# Patient Record
Sex: Male | Born: 1940 | Race: White | Hispanic: No | Marital: Married | Smoking: Former smoker
Health system: Southern US, Community
[De-identification: ages and names within clinical notes are randomized; demographics above are authoritative.]

## PROBLEM LIST (undated history)

## (undated) DIAGNOSIS — E119 Type 2 diabetes mellitus without complications: Secondary | ICD-10-CM

## (undated) DIAGNOSIS — J8410 Pulmonary fibrosis, unspecified: Secondary | ICD-10-CM

## (undated) DIAGNOSIS — I10 Essential (primary) hypertension: Secondary | ICD-10-CM

## (undated) DIAGNOSIS — M3320 Polymyositis, organ involvement unspecified: Secondary | ICD-10-CM

## (undated) HISTORY — PX: EYE SURGERY: SHX253

## (undated) HISTORY — PX: HERNIA REPAIR: SHX51

## (undated) HISTORY — PX: TESTICLE REMOVAL: SHX68

---

## 2014-06-05 ENCOUNTER — Ambulatory Visit: Payer: Self-pay | Admitting: Family Medicine

## 2014-11-10 ENCOUNTER — Ambulatory Visit: Payer: Self-pay | Admitting: Family Medicine

## 2015-07-06 ENCOUNTER — Other Ambulatory Visit: Payer: Self-pay | Admitting: Rheumatology

## 2015-07-06 DIAGNOSIS — M3321 Polymyositis with respiratory involvement: Secondary | ICD-10-CM

## 2015-07-13 ENCOUNTER — Ambulatory Visit
Admission: RE | Admit: 2015-07-13 | Discharge: 2015-07-13 | Disposition: A | Discharge status: Home / Self Care | Payer: Medicare Other | Source: Ambulatory Visit | Attending: Rheumatology | Admitting: Rheumatology

## 2015-07-13 DIAGNOSIS — M3321 Polymyositis with respiratory involvement: Secondary | ICD-10-CM

## 2015-07-13 DIAGNOSIS — M3320 Polymyositis, organ involvement unspecified: Secondary | ICD-10-CM | POA: Insufficient documentation

## 2015-07-13 DIAGNOSIS — M6281 Muscle weakness (generalized): Secondary | ICD-10-CM | POA: Insufficient documentation

## 2016-05-18 ENCOUNTER — Encounter: Payer: Self-pay | Admitting: Medical Oncology

## 2016-05-18 ENCOUNTER — Emergency Department: Payer: Medicare Other

## 2016-05-18 ENCOUNTER — Inpatient Hospital Stay
Admission: EM | Admit: 2016-05-18 | Discharge: 2016-05-27 | DRG: 871 | Disposition: A | Discharge status: Home / Self Care | Payer: Medicare Other | Attending: Internal Medicine | Admitting: Internal Medicine

## 2016-05-18 DIAGNOSIS — E119 Type 2 diabetes mellitus without complications: Secondary | ICD-10-CM | POA: Diagnosis present

## 2016-05-18 DIAGNOSIS — D61818 Other pancytopenia: Secondary | ICD-10-CM | POA: Diagnosis present

## 2016-05-18 DIAGNOSIS — M3320 Polymyositis, organ involvement unspecified: Secondary | ICD-10-CM | POA: Diagnosis present

## 2016-05-18 DIAGNOSIS — J8410 Pulmonary fibrosis, unspecified: Secondary | ICD-10-CM | POA: Diagnosis present

## 2016-05-18 DIAGNOSIS — K7460 Unspecified cirrhosis of liver: Secondary | ICD-10-CM | POA: Diagnosis present

## 2016-05-18 DIAGNOSIS — A419 Sepsis, unspecified organism: Principal | ICD-10-CM | POA: Diagnosis present

## 2016-05-18 DIAGNOSIS — R630 Anorexia: Secondary | ICD-10-CM | POA: Diagnosis not present

## 2016-05-18 DIAGNOSIS — D899 Disorder involving the immune mechanism, unspecified: Secondary | ICD-10-CM | POA: Diagnosis present

## 2016-05-18 DIAGNOSIS — R7989 Other specified abnormal findings of blood chemistry: Secondary | ICD-10-CM | POA: Diagnosis present

## 2016-05-18 DIAGNOSIS — K641 Second degree hemorrhoids: Secondary | ICD-10-CM | POA: Diagnosis present

## 2016-05-18 DIAGNOSIS — Z79899 Other long term (current) drug therapy: Secondary | ICD-10-CM | POA: Diagnosis not present

## 2016-05-18 DIAGNOSIS — K2970 Gastritis, unspecified, without bleeding: Secondary | ICD-10-CM | POA: Diagnosis present

## 2016-05-18 DIAGNOSIS — Z9981 Dependence on supplemental oxygen: Secondary | ICD-10-CM | POA: Diagnosis not present

## 2016-05-18 DIAGNOSIS — D649 Anemia, unspecified: Secondary | ICD-10-CM | POA: Diagnosis not present

## 2016-05-18 DIAGNOSIS — D62 Acute posthemorrhagic anemia: Secondary | ICD-10-CM | POA: Diagnosis not present

## 2016-05-18 DIAGNOSIS — R188 Other ascites: Secondary | ICD-10-CM | POA: Diagnosis present

## 2016-05-18 DIAGNOSIS — Z7982 Long term (current) use of aspirin: Secondary | ICD-10-CM | POA: Diagnosis not present

## 2016-05-18 DIAGNOSIS — Z8249 Family history of ischemic heart disease and other diseases of the circulatory system: Secondary | ICD-10-CM | POA: Diagnosis not present

## 2016-05-18 DIAGNOSIS — J189 Pneumonia, unspecified organism: Secondary | ICD-10-CM | POA: Diagnosis present

## 2016-05-18 DIAGNOSIS — K5730 Diverticulosis of large intestine without perforation or abscess without bleeding: Secondary | ICD-10-CM | POA: Diagnosis present

## 2016-05-18 DIAGNOSIS — Z66 Do not resuscitate: Secondary | ICD-10-CM | POA: Diagnosis present

## 2016-05-18 DIAGNOSIS — Z882 Allergy status to sulfonamides status: Secondary | ICD-10-CM

## 2016-05-18 DIAGNOSIS — I10 Essential (primary) hypertension: Secondary | ICD-10-CM | POA: Diagnosis present

## 2016-05-18 DIAGNOSIS — J9611 Chronic respiratory failure with hypoxia: Secondary | ICD-10-CM | POA: Diagnosis present

## 2016-05-18 DIAGNOSIS — Z87891 Personal history of nicotine dependence: Secondary | ICD-10-CM

## 2016-05-18 DIAGNOSIS — E872 Acidosis: Secondary | ICD-10-CM | POA: Diagnosis present

## 2016-05-18 DIAGNOSIS — R748 Abnormal levels of other serum enzymes: Secondary | ICD-10-CM | POA: Diagnosis present

## 2016-05-18 DIAGNOSIS — K769 Liver disease, unspecified: Secondary | ICD-10-CM

## 2016-05-18 DIAGNOSIS — R17 Unspecified jaundice: Secondary | ICD-10-CM | POA: Diagnosis present

## 2016-05-18 DIAGNOSIS — D849 Immunodeficiency, unspecified: Secondary | ICD-10-CM

## 2016-05-18 DIAGNOSIS — R110 Nausea: Secondary | ICD-10-CM | POA: Diagnosis not present

## 2016-05-18 DIAGNOSIS — Z888 Allergy status to other drugs, medicaments and biological substances status: Secondary | ICD-10-CM | POA: Diagnosis not present

## 2016-05-18 DIAGNOSIS — B179 Acute viral hepatitis, unspecified: Secondary | ICD-10-CM | POA: Insufficient documentation

## 2016-05-18 DIAGNOSIS — R160 Hepatomegaly, not elsewhere classified: Secondary | ICD-10-CM

## 2016-05-18 DIAGNOSIS — R000 Tachycardia, unspecified: Secondary | ICD-10-CM | POA: Diagnosis present

## 2016-05-18 DIAGNOSIS — R0682 Tachypnea, not elsewhere classified: Secondary | ICD-10-CM | POA: Diagnosis present

## 2016-05-18 HISTORY — DX: Type 2 diabetes mellitus without complications: E11.9

## 2016-05-18 HISTORY — DX: Polymyositis, organ involvement unspecified: M33.20

## 2016-05-18 HISTORY — DX: Essential (primary) hypertension: I10

## 2016-05-18 HISTORY — DX: Pulmonary fibrosis, unspecified: J84.10

## 2016-05-18 LAB — URINALYSIS COMPLETE WITH MICROSCOPIC (ARMC ONLY)
Bacteria, UA: NONE SEEN
GLUCOSE, UA: NEGATIVE mg/dL
HGB URINE DIPSTICK: NEGATIVE
LEUKOCYTES UA: NEGATIVE
Nitrite: NEGATIVE
Protein, ur: NEGATIVE mg/dL
SPECIFIC GRAVITY, URINE: 1.02 (ref 1.005–1.030)
pH: 5 (ref 5.0–8.0)

## 2016-05-18 LAB — GLUCOSE, CAPILLARY
GLUCOSE-CAPILLARY: 126 mg/dL — AB (ref 65–99)
Glucose-Capillary: 96 mg/dL (ref 65–99)

## 2016-05-18 LAB — LIPID PANEL
CHOLESTEROL: 212 mg/dL — AB (ref 0–200)
TRIGLYCERIDES: 170 mg/dL — AB (ref <150)
VLDL: 34 mg/dL (ref 0–40)

## 2016-05-18 LAB — CBC WITH DIFFERENTIAL/PLATELET
BASOS ABS: 0 10*3/uL (ref 0–0.1)
BASOS PCT: 0 %
Eosinophils Absolute: 0 10*3/uL (ref 0–0.7)
Eosinophils Relative: 0 %
HEMATOCRIT: 27.7 % — AB (ref 40.0–52.0)
Hemoglobin: 9.2 g/dL — ABNORMAL LOW (ref 13.0–18.0)
LYMPHS ABS: 0.5 10*3/uL — AB (ref 1.0–3.6)
LYMPHS PCT: 17 %
MCH: 37.1 pg — ABNORMAL HIGH (ref 26.0–34.0)
MCHC: 33.4 g/dL (ref 32.0–36.0)
MCV: 111.1 fL — AB (ref 80.0–100.0)
MONOS PCT: 6 %
Monocytes Absolute: 0.2 10*3/uL (ref 0.2–1.0)
NEUTROS ABS: 2.2 10*3/uL (ref 1.4–6.5)
Neutrophils Relative %: 77 %
PLATELETS: 274 10*3/uL (ref 150–440)
RBC: 2.49 MIL/uL — ABNORMAL LOW (ref 4.40–5.90)
RDW: 21.2 % — AB (ref 11.5–14.5)
WBC: 2.9 10*3/uL — ABNORMAL LOW (ref 3.8–10.6)

## 2016-05-18 LAB — TROPONIN I: Troponin I: 0.03 ng/mL (ref <0.031)

## 2016-05-18 LAB — LACTIC ACID, PLASMA
LACTIC ACID, VENOUS: 10.5 mmol/L — AB (ref 0.5–2.0)
LACTIC ACID, VENOUS: 9.5 mmol/L — AB (ref 0.5–2.0)
LACTIC ACID, VENOUS: 10 mmol/L — AB (ref 0.5–2.0)

## 2016-05-18 LAB — COMPREHENSIVE METABOLIC PANEL
ALT: 131 U/L — ABNORMAL HIGH (ref 17–63)
ANION GAP: 22 — AB (ref 5–15)
AST: 144 U/L — ABNORMAL HIGH (ref 15–41)
Albumin: 2.8 g/dL — ABNORMAL LOW (ref 3.5–5.0)
Alkaline Phosphatase: 140 U/L — ABNORMAL HIGH (ref 38–126)
BILIRUBIN TOTAL: 9.8 mg/dL — AB (ref 0.3–1.2)
BUN: 31 mg/dL — AB (ref 6–20)
CHLORIDE: 95 mmol/L — AB (ref 101–111)
CO2: 20 mmol/L — ABNORMAL LOW (ref 22–32)
Calcium: 9.5 mg/dL (ref 8.9–10.3)
Creatinine, Ser: 0.99 mg/dL (ref 0.61–1.24)
Glucose, Bld: 108 mg/dL — ABNORMAL HIGH (ref 65–99)
POTASSIUM: 4.4 mmol/L (ref 3.5–5.1)
Sodium: 137 mmol/L (ref 135–145)
TOTAL PROTEIN: 6.3 g/dL — AB (ref 6.5–8.1)

## 2016-05-18 LAB — LIPASE, BLOOD: LIPASE: 164 U/L — AB (ref 11–51)

## 2016-05-18 LAB — FERRITIN: Ferritin: 890 ng/mL — ABNORMAL HIGH (ref 24–336)

## 2016-05-18 LAB — PROTIME-INR
INR: 1.29
Prothrombin Time: 16.2 seconds — ABNORMAL HIGH (ref 11.4–15.0)

## 2016-05-18 LAB — APTT: APTT: 39 s — AB (ref 24–36)

## 2016-05-18 MED ORDER — SODIUM CHLORIDE 0.9 % IV BOLUS (SEPSIS)
1000.0000 mL | INTRAVENOUS | Status: AC
Start: 2016-05-18 — End: 2016-05-18
  Administered 2016-05-18: 1000 mL via INTRAVENOUS

## 2016-05-18 MED ORDER — SODIUM CHLORIDE 0.9 % IV SOLN
INTRAVENOUS | Status: DC
  Administered 2016-05-18 – 2016-05-26 (×13): via INTRAVENOUS

## 2016-05-18 MED ORDER — PREDNISONE 10 MG PO TABS
10.0000 mg | ORAL_TABLET | Freq: Every day | ORAL | Status: DC
Start: 2016-05-19 — End: 2016-05-20
  Administered 2016-05-19 – 2016-05-20 (×2): 10 mg via ORAL
  Filled 2016-05-18 (×2): qty 1

## 2016-05-18 MED ORDER — DEXTROSE 5 % IV SOLN
1.0000 g | Freq: Once | INTRAVENOUS | Status: AC
  Administered 2016-05-18: 1 g via INTRAVENOUS
  Filled 2016-05-18: qty 1

## 2016-05-18 MED ORDER — SODIUM CHLORIDE 0.9 % IV BOLUS (SEPSIS)
2000.0000 mL | INTRAVENOUS | Status: AC
  Administered 2016-05-18: 2000 mL via INTRAVENOUS

## 2016-05-18 MED ORDER — MORPHINE SULFATE (PF) 2 MG/ML IV SOLN: 2.0000 mg | Freq: Four times a day (QID) | INTRAVENOUS | Status: DC | PRN

## 2016-05-18 MED ORDER — DEXTROSE 5 % IV SOLN: 1.0000 g | Freq: Two times a day (BID) | INTRAVENOUS | Status: DC

## 2016-05-18 MED ORDER — VANCOMYCIN HCL IN DEXTROSE 1-5 GM/200ML-% IV SOLN
1000.0000 mg | Freq: Two times a day (BID) | INTRAVENOUS | Status: DC
  Administered 2016-05-18 – 2016-05-20 (×4): 1000 mg via INTRAVENOUS
  Filled 2016-05-18 (×5): qty 200

## 2016-05-18 MED ORDER — DEXTROSE 5 % IV SOLN
1.0000 g | Freq: Two times a day (BID) | INTRAVENOUS | Status: DC
  Administered 2016-05-18 – 2016-05-22 (×8): 1 g via INTRAVENOUS
  Filled 2016-05-18 (×9): qty 1

## 2016-05-18 MED ORDER — VANCOMYCIN HCL IN DEXTROSE 1-5 GM/200ML-% IV SOLN
1000.0000 mg | Freq: Once | INTRAVENOUS | Status: AC
  Administered 2016-05-18: 1000 mg via INTRAVENOUS
  Filled 2016-05-18: qty 200

## 2016-05-18 MED ORDER — INSULIN ASPART 100 UNIT/ML ~~LOC~~ SOLN
0.0000 [IU] | Freq: Three times a day (TID) | SUBCUTANEOUS | Status: DC
  Administered 2016-05-19 – 2016-05-20 (×2): 3 [IU] via SUBCUTANEOUS
  Administered 2016-05-20: 5 [IU] via SUBCUTANEOUS
  Administered 2016-05-20 – 2016-05-21 (×2): 2 [IU] via SUBCUTANEOUS
  Administered 2016-05-21: 3 [IU] via SUBCUTANEOUS
  Filled 2016-05-18: qty 3
  Filled 2016-05-18: qty 5
  Filled 2016-05-18 (×2): qty 2
  Filled 2016-05-18 (×2): qty 3

## 2016-05-18 NOTE — Progress Notes (Signed)
ANTIBIOTIC CONSULT NOTE - INITIAL  Pharmacy Consult for Vancomycin/renal adjust medications Indication: pneumonia  Allergies  Allergen Reactions  . Atorvastatin     Other reaction(s): Muscle Pain  . Sulfa Antibiotics Diarrhea  . Sulfamethoxazole-Trimethoprim Diarrhea    Patient Measurements: Height: 5\' 10"  (177.8 cm) Weight: 185 lb (83.915 kg) IBW/kg (Calculated) : 73   Vital Signs: Temp: 97.6 F (36.4 C) (05/25 1056) Temp Source: Oral (05/25 1056) BP: 141/67 mmHg (05/25 1430) Pulse Rate: 88 (05/25 1430) Intake/Output from previous day:   Intake/Output from this shift: Total I/O In: 1000 [I.V.:1000] Out: 100 [Urine:100]  Labs:  Recent Labs  05/18/16 1150  WBC 2.9*  HGB 9.2*  PLT 274  CREATININE 0.99   Estimated Creatinine Clearance: 67.6 mL/min (by C-G formula based on Cr of 0.99). No results for input(s): VANCOTROUGH, VANCOPEAK, VANCORANDOM, GENTTROUGH, GENTPEAK, GENTRANDOM, TOBRATROUGH, TOBRAPEAK, TOBRARND, AMIKACINPEAK, AMIKACINTROU, AMIKACIN in the last 72 hours.   Microbiology: No results found for this or any previous visit (from the past 720 hour(s)).  Medical History: Past Medical History  Diagnosis Date  . Polymyositis (HCC)   . Pulmonary fibrosis (HCC)   . Diabetes mellitus without complication (HCC)   . Hypertension     Medications:  Scheduled:  . insulin aspart  0-9 Units Subcutaneous TID WC   Infusions:  . sodium chloride    . ceFEPime (MAXIPIME) IV    . vancomycin 1,000 mg (05/18/16 1406)  . vancomycin     Assessment: Pharmacy consulted to dose Vancomycin in a 75 yo male for HCAP. Patient received Vancomycin 1 gm IV once in ED at 1406.  Est CrCl~68 mL/min, ke: 0.061, t1/2: 11.36 h, Vd: 58.7 L  Goal of Therapy:  Vancomycin trough level 15-20 mcg/ml  Plan:  Will order Vancomycin 1 gm IV q12h with next dose due in 6 hours for stacked dosing. Estimated trough calculated of ~16 based on kinetic parameters.  Will check trough prior  to 5th dose (should be at steady state) on 5/27 at 1930.  Will order Scr in AM for monitoring.   Pharmacy will continue to follow.   Clarisa Schoolsrystal Leanora Murin, PharmD Clinical Pharmacist 05/18/2016

## 2016-05-18 NOTE — ED Notes (Signed)
Pt reports that he was sent by his PCP for sob and jaundice. Pt reports he was placed on a med that causes him to have jaundice but now pt reports that he has been having pale colored stools and worsening of jaundice. Pt denies pain.

## 2016-05-18 NOTE — ED Notes (Signed)
Called code sepsis to doug in [facility]  [phone]

## 2016-05-18 NOTE — ED Notes (Signed)
Pt in via triage; pt wife reports pt becoming jaundiced since last night.  Pt wife reports light color stools since Monday.  Pt with hx of pulmonary fibrosis and polymyositis.  Pt reports having liver function checked regularly due to one of his medications.  Pt A/Ox4, tachypneic, 2L home O2 upon arrival.

## 2016-05-18 NOTE — Progress Notes (Signed)
Pt arrived on unit. VSS, telemetry monitor was placed.   Lee Johnson

## 2016-05-18 NOTE — Consult Note (Signed)
[facility]  [address], [address] [place], [place] [address] Phone: [phone] Fax : [phone]  Consultation  Referring Provider:     No ref. provider found Primary Care Physician:  Dione Housekeeper, MD Primary Gastroenterologist:           Reason for Consultation:     Abnormal liver enzymes  Date of Admission:  05/18/2016 Date of Consultation:  05/18/2016         HPI:   Lee Johnson is a 75 y.o. male was a history of polymyositis porta fibrosis diabetes and hypertension. The patient has been on azathioprine for his polymyositis and the dose was increased last month. The patient states that his been feeling progressively weak and had started getting dark urine for the last couple of weeks. The wife states that she noticed him to become yellow yesterday. She noticed at the skin around his eyes were yellow. The patient was also found to have a high lactic acid level in the ER. The patient denies any other medications that are new and he also denies any over-the-counter medications. There is no report of any nausea vomiting fevers or chills. He also denies alcohol abuse.  Past Medical History  Diagnosis Date  . Polymyositis (HCC)   . Pulmonary fibrosis (HCC)   . Diabetes mellitus without complication (HCC)   . Hypertension     No past surgical history on file.  Prior to Admission medications   Medication Sig Start Date End Date Taking? Authorizing Provider  Acetaminophen 500 MG coapsule Take 1,000 mg by mouth daily. Pt also takes PRN   Yes Historical Provider, MD  aspirin EC 81 MG tablet Take 81 mg by mouth daily.    Yes Historical Provider, MD  azaTHIOprine (IMURAN) 50 MG tablet Take 2 tablets by mouth 2 (two) times daily. 03/27/16  Yes Historical Provider, MD  betamethasone valerate lotion (VALISONE) 0.1 % Apply 1 application topically as needed. apply to face 10/07/13  Yes Historical Provider, MD  Calcium Carbonate-Vitamin D 600-400 MG-UNIT tablet Take 1  tablet by mouth daily.    Yes Historical Provider, MD  Cholecalciferol (VITAMIN D-1000 MAX ST) 1000 units tablet Take 1,000 Units by mouth daily.    Yes Historical Provider, MD  enalapril-hydrochlorothiazide (VASERETIC) 10-25 MG tablet Take 0.5 tablets by mouth daily.  12/06/15 12/05/16 Yes Historical Provider, MD  esomeprazole (NEXIUM) 20 MG capsule Take 20 mg by mouth daily.    Yes Historical Provider, MD  Flaxseed, Linseed, (FLAXSEED OIL) 1000 MG CAPS Take 1 capsule by mouth 2 (two) times daily.    Yes Historical Provider, MD  glipiZIDE (GLUCOTROL XL) 10 MG 24 hr tablet Take 10 mg by mouth daily after supper.  05/18/16  Yes Historical Provider, MD  glipiZIDE (GLUCOTROL) 10 MG tablet Take 10 mg by mouth daily.  05/05/16 05/05/17 Yes Historical Provider, MD  glucose (SUNMARK GLUCOSE) 4 GM chewable tablet Chew 1 tablet by mouth as needed. For low blood sugar 03/21/16 03/21/17 Yes Historical Provider, MD  metFORMIN (GLUCOPHAGE-XR) 500 MG 24 hr tablet Take 2 tablets by mouth 2 (two) times daily after a meal. 12/06/15 12/05/16 Yes Historical Provider, MD  Omega-3 Fatty Acids (FISH OIL) 1000 MG CAPS Take 1 capsule by mouth 2 (two) times daily.   Yes Historical Provider, MD  predniSONE (DELTASONE) 5 MG tablet Take 2 tablets by mouth daily.   Yes Historical Provider, MD  tamsulosin (FLOMAX) 0.4 MG CAPS capsule Take 1 capsule by mouth daily after supper. 07/27/15  07/26/16 Yes Historical Provider, MD    Family History  Problem Relation Age of Onset  . CAD Mother      Social History  Substance Use Topics  . Smoking status: Never Smoker   . Smokeless tobacco: None  . Alcohol Use: No    Allergies as of 05/18/2016 - Review Complete 05/18/2016  Allergen Reaction Noted  . Atorvastatin  05/18/2016  . Sulfa antibiotics Diarrhea 05/18/2016  . Sulfamethoxazole-trimethoprim Diarrhea 05/18/2016    Review of Systems:    All systems reviewed and negative except where noted in HPI.   Physical Exam:  Vital signs  in last 24 hours: Temp:  [97.2 F (36.2 C)-97.6 F (36.4 C)] 97.2 F (36.2 C) (05/25 1639) Pulse Rate:  [86-120] 90 (05/25 1639) Resp:  [16-26] 18 (05/25 1639) BP: (116-145)/(50-70) 116/50 mmHg (05/25 1639) SpO2:  [98 %-100 %] 100 % (05/25 1639) Weight:  [179 lb 14.4 oz (81.602 kg)-185 lb (83.915 kg)] 179 lb 14.4 oz (81.602 kg) (05/25 1639)   General:   Pleasant, cooperative in NAD Head:  Normocephalic and atraumatic. Eyes:   Positive scleral  icterus.   Conjunctiva yellow. PERRLA. Ears:  Normal auditory acuity. Neck:  Supple; no masses or thyroidomegaly Lungs: Respirations even and unlabored. Lungs clear to auscultation bilaterally.   No wheezes, crackles, or rhonchi.  Heart:  Regular rate and rhythm;  Without murmur, clicks, rubs or gallops Abdomen:  Soft, nondistended, nontender. Normal bowel sounds. No appreciable masses or hepatomegaly.  No rebound or guarding.  Rectal:  Not performed. Msk:  Symmetrical without gross deformities.    Extremities:  Without edema, cyanosis or clubbing. Neurologic:  Alert and oriented x3;  grossly normal neurologically. Skin:  Intact without significant lesions or rashes. Diffuse jaundice Cervical Nodes:  No significant cervical adenopathy. Psych:  Alert and cooperative. Normal affect.  LAB RESULTS:  Recent Labs  05/18/16 1150  WBC 2.9*  HGB 9.2*  HCT 27.7*  PLT 274   BMET  Recent Labs  05/18/16 1150  NA 137  K 4.4  CL 95*  CO2 20*  GLUCOSE 108*  BUN 31*  CREATININE 0.99  CALCIUM 9.5   LFT  Recent Labs  05/18/16 1150  PROT 6.3*  ALBUMIN 2.8*  AST 144*  ALT 131*  ALKPHOS 140*  BILITOT 9.8*   PT/INR  Recent Labs  05/18/16 1150  LABPROT 16.2*  INR 1.29    STUDIES: Dg Chest 2 View  05/18/2016  CLINICAL DATA:  Worsening shortness of Breath EXAM: CHEST  2 VIEW COMPARISON:  By report from 07/12/2015 FINDINGS: Cardiac shadow is enlarged. Bilateral lower lobe interstitial prominence is noted stable by report from the  prior exam. Increased densities node in the left mid lung which may represent some acute on chronic infiltrate. No bony abnormality is seen. IMPRESSION: Bibasilar chronic changes by report. Increased density in the left mid lung which may represent acute on chronic infiltrate. Electronically Signed   By: Alcide Clever M.D.   On: 05/18/2016 12:21      Impression / Plan:   Lee Johnson is a 75 y.o. y/o male with With a history of polymyositis, already fibrosis diabetes and hypertension. The patient was now found to have an elevated Michela Pitcher Roux been with increased AST and ALT. The patient's levels are more consistent with injury pattern cholestasis then extrahepatic obstruction. His may be due to infection versus medications. The patient will have his labs sent for other possible causes of abnormal liver enzymes and his liver  enzymes will be followed. The patient also has an ultrasound pending but I suspect the bile duct will be normal in size. Patient and his wife have been explained the plan and agree with it.   Thank you for involving me in the care of this patient.      LOS: 0 days   Midge Miniumarren Agness Sibrian, MD  05/18/2016, 5:59 PM   Note: This dictation was prepared with Dragon dictation along with smaller phrase technology. Any transcriptional errors that result from this process are unintentional.

## 2016-05-18 NOTE — ED Provider Notes (Signed)
[facility] Emergency Department Provider Note  ____________________________________________  Time seen: Approximately 11:40 AM  I have reviewed the triage vital signs and the nursing notes.   HISTORY  Chief Complaint Jaundice    HPI Lee Johnson is a 75 y.o. male with a comp located past medical history that includes polymyositis and subsequent development of pulmonary fibrosis on Imuran and who, until last month, was on nintedanib for idiopathic pulmonary fibrosis but was taken off since his pulmonary fibrosis is thought to be caused by the polymyositis.  He is followed by Dr. Lavenia Atlas as well as a pulmonologist at [facility]  He presents today by private vehicle for worsening shortness of breath, gradual onset over at least one week, in addition to acute onset of jaundice starting yesterday.  He is on 2 L of oxygen at all times and states that his shortness of breath has gotten so bad that he cannot get up out of bed without assistance and he is immediately out of breath.  His wife started having to help him to the bathroom starting about 4 days ago when she noticed that his stool is very light in color.  Last night she noticed that his skin and his eyes were turning yellow and it is much worse today, described as severe.  He has been taking his regular medications but nothing is making his symptoms better and they are gradually getting worse over time.  Any kind of exertion makes his shortness of breath and fatigue worse.  He denies fever/chills, chest pain, abdominal pain, nausea, vomiting, diarrhea, dysuria.  He states that he has had no energy and no appetite for the last week so has not been eating or drinking well.   Past Medical History  Diagnosis Date  . Polymyositis (HCC)   . Pulmonary fibrosis (HCC)   . Diabetes mellitus without complication (HCC)   . Hypertension     There are no active problems to display for this patient.   No past  surgical history on file.  Current Outpatient Rx  Name  Route  Sig  Dispense  Refill  . Acetaminophen 500 MG coapsule   Oral   Take 1,000 mg by mouth daily. Pt also takes PRN         . aspirin EC 81 MG tablet   Oral   Take 81 mg by mouth daily.          Marland Kitchen azaTHIOprine (IMURAN) 50 MG tablet   Oral   Take 2 tablets by mouth 2 (two) times daily.         . betamethasone valerate lotion (VALISONE) 0.1 %   Topical   Apply 1 application topically as needed. apply to face         . Calcium Carbonate-Vitamin D 600-400 MG-UNIT tablet   Oral   Take 1 tablet by mouth daily.          . Cholecalciferol (VITAMIN D-1000 MAX ST) 1000 units tablet   Oral   Take 1,000 Units by mouth daily.          . enalapril-hydrochlorothiazide (VASERETIC) 10-25 MG tablet   Oral   Take 0.5 tablets by mouth daily.          Marland Kitchen esomeprazole (NEXIUM) 20 MG capsule   Oral   Take 20 mg by mouth daily.          . Flaxseed, Linseed, (FLAXSEED OIL) 1000 MG CAPS   Oral   Take 1  capsule by mouth 2 (two) times daily.          Marland Kitchen glipiZIDE (GLUCOTROL XL) 10 MG 24 hr tablet   Oral   Take 10 mg by mouth daily after supper.          Marland Kitchen glipiZIDE (GLUCOTROL) 10 MG tablet   Oral   Take 10 mg by mouth daily.          Marland Kitchen glucose (SUNMARK GLUCOSE) 4 GM chewable tablet   Oral   Chew 1 tablet by mouth as needed. For low blood sugar         . metFORMIN (GLUCOPHAGE-XR) 500 MG 24 hr tablet   Oral   Take 2 tablets by mouth 2 (two) times daily after a meal.         . Omega-3 Fatty Acids (FISH OIL) 1000 MG CAPS   Oral   Take 1 capsule by mouth 2 (two) times daily.         . predniSONE (DELTASONE) 5 MG tablet   Oral   Take 2 tablets by mouth daily.         . tamsulosin (FLOMAX) 0.4 MG CAPS capsule   Oral   Take 1 capsule by mouth daily after supper.           Allergies Atorvastatin; Sulfa antibiotics; and Sulfamethoxazole-trimethoprim  No family history on file.  Social  History Social History  Substance Use Topics  . Smoking status: Never Smoker   . Smokeless tobacco: None  . Alcohol Use: No    Review of Systems Constitutional: No fever/chills.  General fatigue and malaise.   Eyes: No visual changes. ENT: No sore throat. Cardiovascular: Denies chest pain. Respiratory: Worsening shortness of breath (acute on chronic) Gastrointestinal: No abdominal pain.  +Anorexia.  No nausea, no vomiting.  No diarrhea.  No constipation.  Light colored stools. Genitourinary: Negative for dysuria. Musculoskeletal: Negative for back pain. Skin: Wife noticed jaundice starting last night Neurological: Negative for headaches, focal weakness or numbness.  10-point ROS otherwise negative.  ____________________________________________   PHYSICAL EXAM:  VITAL SIGNS: ED Triage Vitals  Enc Vitals Group     BP 05/18/16 1056 136/60 mmHg     Pulse Rate 05/18/16 1056 120     Resp 05/18/16 1056 18     Temp 05/18/16 1056 97.6 F (36.4 C)     Temp Source 05/18/16 1056 Oral     SpO2 05/18/16 1056 98 %     Weight 05/18/16 1056 185 lb (83.915 kg)     Height 05/18/16 1056 5\' 10"  (1.778 m)     Head Cir --      Peak Flow --      Pain Score --      Pain Loc --      Pain Edu? --      Excl. in GC? --     Constitutional: Alert and oriented. Appears ill but non-toxic.   Eyes: +Scleral icterus. PERRL. EOMI. Head: Atraumatic. Nose: No congestion/rhinnorhea. Mouth/Throat: Mucous membranes are dry.  Oropharynx non-erythematous. Neck: No stridor.  No meningeal signs.   Cardiovascular: Tachycardia, regular rhythm. Good peripheral circulation. Grossly normal heart sounds.   Respiratory: Normal respiratory effort.  No retractions. Lungs CTAB. Gastrointestinal: Soft and nontender throughout. No distention.  Musculoskeletal: No lower extremity tenderness nor edema. No gross deformities of extremities. Neurologic:  Normal speech and language. No gross focal neurologic deficits are  appreciated.  Skin:  Skin is warm, dry and intact. No rash noted. Psychiatric:  Mood and affect are depressed but the patient is AOx3  ____________________________________________   LABS (all labs ordered are listed, but only abnormal results are displayed)  Labs Reviewed  CBC WITH DIFFERENTIAL/PLATELET - Abnormal; Notable for the following:    WBC 2.9 (*)    RBC 2.49 (*)    Hemoglobin 9.2 (*)    HCT 27.7 (*)    MCV 111.1 (*)    MCH 37.1 (*)    RDW 21.2 (*)    Lymphs Abs 0.5 (*)    All other components within normal limits  COMPREHENSIVE METABOLIC PANEL - Abnormal; Notable for the following:    Chloride 95 (*)    CO2 20 (*)    Glucose, Bld 108 (*)    BUN 31 (*)    Total Protein 6.3 (*)    Albumin 2.8 (*)    AST 144 (*)    ALT 131 (*)    Alkaline Phosphatase 140 (*)    Total Bilirubin 9.8 (*)    Anion gap 22 (*)    All other components within normal limits  LIPASE, BLOOD - Abnormal; Notable for the following:    Lipase 164 (*)    All other components within normal limits  LIPID PANEL - Abnormal; Notable for the following:    Cholesterol 212 (*)    Triglycerides 170 (*)    HDL <10 (*)    All other components within normal limits  PROTIME-INR - Abnormal; Notable for the following:    Prothrombin Time 16.2 (*)    All other components within normal limits  APTT - Abnormal; Notable for the following:    aPTT 39 (*)    All other components within normal limits  LACTIC ACID, PLASMA - Abnormal; Notable for the following:    Lactic Acid, Venous 10.5 (*)    All other components within normal limits  URINALYSIS COMPLETEWITH MICROSCOPIC ([facility] ONLY) - Abnormal; Notable for the following:    Color, Urine AMBER (*)    APPearance CLEAR (*)    Bilirubin Urine 1+ (*)    Ketones, ur 1+ (*)    Squamous Epithelial / LPF 0-5 (*)    All other components within normal limits  CULTURE, BLOOD (ROUTINE X 2)  CULTURE, BLOOD (ROUTINE X 2)  URINE CULTURE  TROPONIN I  HEPATITIS PANEL,  ACUTE  LACTIC ACID, PLASMA   ____________________________________________  EKG  ED ECG REPORT I, Rickardo Brinegar, the attending physician, personally viewed and interpreted this ECG.  Date: 05/18/2016 EKG Time: 11:41 Rate: 107 Rhythm: Mild sinus tachycardia QRS Axis: normal Intervals: normal ST/T Wave abnormalities: Non-specific ST segment / T-wave changes, but no evidence of acute ischemia. Conduction Disturbances: none Narrative Interpretation: unremarkable  ____________________________________________  RADIOLOGY   Dg Chest 2 View  05/18/2016  CLINICAL DATA:  Worsening shortness of Breath EXAM: CHEST  2 VIEW COMPARISON:  By report from 07/12/2015 FINDINGS: Cardiac shadow is enlarged. Bilateral lower lobe interstitial prominence is noted stable by report from the prior exam. Increased densities node in the left mid lung which may represent some acute on chronic infiltrate. No bony abnormality is seen. IMPRESSION: Bibasilar chronic changes by report. Increased density in the left mid lung which may represent acute on chronic infiltrate. Electronically Signed   By: Alcide CleverMark  Lukens M.D.   On: 05/18/2016 12:21    ____________________________________________   PROCEDURES  Procedure(s) performed: None  Critical Care performed: Yes, see critical care note(s)   CRITICAL CARE Performed by: Loleta RoseFORBACH, Vencent Hauschild   Total  critical care time: 60 minutes  Critical care time was exclusive of separately billable procedures and treating other patients.  Critical care was necessary to treat or prevent imminent or life-threatening deterioration.  Critical care was time spent personally by me on the following activities: development of treatment plan with patient and/or surrogate as well as nursing, discussions with consultants, evaluation of patient's response to treatment, examination of patient, obtaining history from patient or surrogate, ordering and performing treatments and interventions,  ordering and review of laboratory studies, ordering and review of radiographic studies, pulse oximetry and re-evaluation of patient's condition.  ____________________________________________   INITIAL IMPRESSION / ASSESSMENT AND PLAN / ED COURSE  Pertinent labs & imaging results that were available during my care of the patient were reviewed by me and considered in my medical decision making (see chart for details).  Obvious jaundice and scleral icterus, Likely medication induced hepatitis.  He has no abdominal pain or tenderness and has not been having any nausea nor vomiting.  He does have anorexia.  He has no symptoms that would suggest he has a new infectious process such as pneumonia, and his worsening shortness of breath is likely due to the chronic pulmonary fibrosis, but I will also check a two-view chest x-ray to look for signs of infiltrate.  I also ordered a lipid panel and acute hepatitis panel knowing that these pieces sedated would likely be needed by the hospitalist.  I anticipate admission after getting back his initial results.   (Note that documentation was delayed due to multiple ED patients requiring immediate care.)  Patient has possible acute infiltrate in left lung on CXR . Given his immunosuppression, I will check cultures and start empiric antibiotics for HCAP . He has mild leukopenia, but his wife states this is always the case.  His blood pressure is stable and he has no AMS.  He is receiving fluids, empiric antibiotics after cultures, and I will check a lactic acid, but holding off on calling code sepsis.   ----------------------------------------- 1:39 PM on 05/18/2016 -----------------------------------------  I was just informed that the patient's lactate has resulted at 10.5.  His This is multifactorial but given that I am treating empirically for a possible pulmonary infiltrate I will proceed with making him sepsis protocol.  We have called [facility] and I have  ordered the rest of the results including an additional 2 L which will reach his goal of 30 mL/kg IV crystalloid.  I have updated the patient.  He is already receiving empiric antibiotics for HCAP (cefepime and vancomycin) as per the [facility] order set for HCAP .   ----------------------------------------- (Note that documentation was delayed due to multiple ED patients requiring immediate care.) -----------------------------------------  Discussed case with hospitalist who will admit.  Patient remains stable, likely requiring stepdown bed but does not appear to need CCU/intensivist at this time.  Reexamined patient, still no abdominal tenderness to palpation and no abd pain at rest.  HR down to the low 90s.   ____________________________________________  FINAL CLINICAL IMPRESSION(S) / ED DIAGNOSES  Final diagnoses:  Sepsis, due to unspecified organism [facility])  Acute hepatitis  Hyperbilirubinemia  HCAP (healthcare-associated pneumonia)  Pulmonary fibrosis (HCC)  Immunocompromised state (HCC)  Elevated lactic acid level     MEDICATIONS GIVEN DURING THIS VISIT:  Medications  vancomycin (VANCOCIN) IVPB 1000 mg/200 mL premix (1,000 mg Intravenous New Bag/Given 05/18/16 1406)  sodium chloride 0.9 % bolus 1,000 mL (0 mLs Intravenous Stopped 05/18/16 1316)  ceFEPIme (MAXIPIME) 1 g in  dextrose 5 % 50 mL IVPB (1 g Intravenous New Bag/Given 05/18/16 1347)  sodium chloride 0.9 % bolus 2,000 mL (2,000 mLs Intravenous New Bag/Given 05/18/16 1345)     NEW OUTPATIENT MEDICATIONS STARTED DURING THIS VISIT:  New Prescriptions   No medications on file      Note:  This document was prepared using Dragon voice recognition software and may include unintentional dictation errors.   Loleta Rose, MD 05/18/16 [phone]

## 2016-05-18 NOTE — H&P (Signed)
[facility] - [place] [facility]   PATIENT NAME: Lee Johnson    MR#:  [id number]  DATE OF BIRTH:  04-28-1941  DATE OF ADMISSION:  05/18/2016  PRIMARY CARE PHYSICIAN: Dione Housekeeper, MD   REQUESTING/REFERRING PHYSICIAN: York Cerise  CHIEF COMPLAINT:   Chief Complaint  Patient presents with  . Jaundice    HISTORY OF PRESENT ILLNESS: Takai Chiaramonte  is a 75 y.o. male with a known history of Polymyositis, pulmonary fibrosis, diabetes without complication, hypertension- he is on azathioprin for his polymyositis by rheumatologist, the dose was increased last month. For last few days patient is feeling more weak, progressively getting worse. Last 2-3 days wife noted his stool is getting lighter in color, and today she noted he has yellowish skin and eyes so she brought him to emergency room. Patient denies any fever or chills, he has chronic lung damage because of polymyositis and he is on chronic oxygen use at home.  He was noted to have severe jaundice and very high lactic acid in ER, but vitals are stable chest x-ray showed a new infiltrate and so he started on antibiotics. Given to hospitalist team for further management.   PAST MEDICAL HISTORY:   Past Medical History  Diagnosis Date  . Polymyositis (HCC)   . Pulmonary fibrosis (HCC)   . Diabetes mellitus without complication (HCC)   . Hypertension     PAST SURGICAL HISTORY: No past surgical history on file.  SOCIAL HISTORY:  Social History  Substance Use Topics  . Smoking status: Never Smoker   . Smokeless tobacco: Not on file  . Alcohol Use: No    FAMILY HISTORY: No family history on file.  DRUG ALLERGIES:  Allergies  Allergen Reactions  . Atorvastatin     Other reaction(s): Muscle Pain  . Sulfa Antibiotics Diarrhea  . Sulfamethoxazole-Trimethoprim Diarrhea    REVIEW OF SYSTEMS:   CONSTITUTIONAL: No fever,Positive for  fatigue or weakness.  EYES: No blurred or double vision.  EARS, NOSE,  AND THROAT: No tinnitus or ear pain.  RESPIRATORY: No cough,  positive for shortness of breath, wheezing or hemoptysis.  CARDIOVASCULAR: No chest pain, orthopnea, edema.  GASTROINTESTINAL: No nausea, vomiting, diarrhea or abdominal pain.  GENITOURINARY: No dysuria, hematuria.  ENDOCRINE: No polyuria, nocturia,  HEMATOLOGY: No anemia, easy bruising or bleeding SKIN: No rash or lesion. MUSCULOSKELETAL: No joint pain or arthritis.   NEUROLOGIC: No tingling, numbness, weakness.  PSYCHIATRY: No anxiety or depression.   MEDICATIONS AT HOME:  Prior to Admission medications   Medication Sig Start Date End Date Taking? Authorizing Provider  Acetaminophen 500 MG coapsule Take 1,000 mg by mouth daily. Pt also takes PRN   Yes Historical Provider, MD  aspirin EC 81 MG tablet Take 81 mg by mouth daily.    Yes Historical Provider, MD  azaTHIOprine (IMURAN) 50 MG tablet Take 2 tablets by mouth 2 (two) times daily. 03/27/16  Yes Historical Provider, MD  betamethasone valerate lotion (VALISONE) 0.1 % Apply 1 application topically as needed. apply to face 10/07/13  Yes Historical Provider, MD  Calcium Carbonate-Vitamin D 600-400 MG-UNIT tablet Take 1 tablet by mouth daily.    Yes Historical Provider, MD  Cholecalciferol (VITAMIN D-1000 MAX ST) 1000 units tablet Take 1,000 Units by mouth daily.    Yes Historical Provider, MD  enalapril-hydrochlorothiazide (VASERETIC) 10-25 MG tablet Take 0.5 tablets by mouth daily.  12/06/15 12/05/16 Yes Historical Provider, MD  esomeprazole (NEXIUM) 20 MG capsule Take 20 mg by mouth daily.  Yes Historical Provider, MD  Flaxseed, Linseed, (FLAXSEED OIL) 1000 MG CAPS Take 1 capsule by mouth 2 (two) times daily.    Yes Historical Provider, MD  glipiZIDE (GLUCOTROL XL) 10 MG 24 hr tablet Take 10 mg by mouth daily after supper.  05/18/16  Yes Historical Provider, MD  glipiZIDE (GLUCOTROL) 10 MG tablet Take 10 mg by mouth daily.  05/05/16 05/05/17 Yes Historical Provider, MD  glucose  (SUNMARK GLUCOSE) 4 GM chewable tablet Chew 1 tablet by mouth as needed. For low blood sugar 03/21/16 03/21/17 Yes Historical Provider, MD  metFORMIN (GLUCOPHAGE-XR) 500 MG 24 hr tablet Take 2 tablets by mouth 2 (two) times daily after a meal. 12/06/15 12/05/16 Yes Historical Provider, MD  Omega-3 Fatty Acids (FISH OIL) 1000 MG CAPS Take 1 capsule by mouth 2 (two) times daily.   Yes Historical Provider, MD  predniSONE (DELTASONE) 5 MG tablet Take 2 tablets by mouth daily.   Yes Historical Provider, MD  tamsulosin (FLOMAX) 0.4 MG CAPS capsule Take 1 capsule by mouth daily after supper. 07/27/15 07/26/16 Yes Historical Provider, MD      PHYSICAL EXAMINATION:   VITAL SIGNS: Blood pressure 137/70, pulse 90, temperature 97.6 F (36.4 C), temperature source Oral, resp. rate 18, height  (1.778 m), weight 83.915 kg (185 lb), SpO2 100 %.  GENERAL:  75 y.o.-year-old patient lying in the bed with no acute distress.  EYES: Pupils equal, round, reactive to light and accommodation.Present scleral  icterus. Extraocular muscles intact.  HEENT: Head atraumatic, normocephalic. Oropharynx and nasopharynx clear.  NECK:  Supple, no jugular venous distention. No thyroid enlargement, no tenderness.  LUNGS: Normal breath sounds bilaterally, no wheezing,some  crepitation. No use of accessory muscles of respiration. On supplemental oxygen via nasal cannula.  CARDIOVASCULAR: S1, S2 normal. No murmurs, rubs, or gallops.  ABDOMEN: Soft, nontender, nondistended. Bowel sounds present. No organomegaly or mass.  EXTREMITIES: No pedal edema, cyanosis, or clubbing.  NEUROLOGIC: Cranial nerves II through XII are intact. Muscle strength 5/5 in all extremities. Sensation intact. Gait not checked.  PSYCHIATRIC: The patient is alert and oriented x 3.  SKIN: No obvious rash, lesion, or ulcer. Yellowish discoloration of his skin and sclera.  LABORATORY PANEL:   CBC  Recent Labs Lab 05/18/16 1150  WBC 2.9*  HGB 9.2*  HCT  27.7*  PLT 274  MCV 111.1*  MCH 37.1*  MCHC 33.4  RDW 21.2*  LYMPHSABS 0.5*  MONOABS 0.2  EOSABS 0.0  BASOSABS 0.0   ------------------------------------------------------------------------------------------------------------------  Chemistries   Recent Labs Lab 05/18/16 1150  NA 137  K 4.4  CL 95*  CO2 20*  GLUCOSE 108*  BUN 31*  CREATININE 0.99  CALCIUM 9.5  AST 144*  ALT 131*  ALKPHOS 140*  BILITOT 9.8*   ------------------------------------------------------------------------------------------------------------------ estimated creatinine clearance is 67.6 mL/min (by C-G formula based on Cr of 0.99). ------------------------------------------------------------------------------------------------------------------ No results for input(s): TSH, T4TOTAL, T3FREE, THYROIDAB in the last 72 hours.  Invalid input(s): FREET3   Coagulation profile  Recent Labs Lab 05/18/16 1150  INR 1.29   ------------------------------------------------------------------------------------------------------------------- No results for input(s): DDIMER in the last 72 hours. -------------------------------------------------------------------------------------------------------------------  Cardiac Enzymes  Recent Labs Lab 05/18/16 1150  TROPONINI 0.03   ------------------------------------------------------------------------------------------------------------------ Invalid input(s): POCBNP  ---------------------------------------------------------------------------------------------------------------  Urinalysis    Component Value Date/Time   COLORURINE AMBER* 05/18/2016 1342   APPEARANCEUR CLEAR* 05/18/2016 1342   LABSPEC 1.020 05/18/2016 1342   PHURINE 5.0 05/18/2016 1342   GLUCOSEU NEGATIVE 05/18/2016 1342   HGBUR NEGATIVE 05/18/2016 1342  BILIRUBINUR 1+* 05/18/2016 1342   KETONESUR 1+* 05/18/2016 1342   PROTEINUR NEGATIVE 05/18/2016 1342   NITRITE NEGATIVE  05/18/2016 1342   LEUKOCYTESUR NEGATIVE 05/18/2016 1342     RADIOLOGY: Dg Chest 2 View  05/18/2016  CLINICAL DATA:  Worsening shortness of Breath EXAM: CHEST  2 VIEW COMPARISON:  By report from 07/12/2015 FINDINGS: Cardiac shadow is enlarged. Bilateral lower lobe interstitial prominence is noted stable by report from the prior exam. Increased densities node in the left mid lung which may represent some acute on chronic infiltrate. No bony abnormality is seen. IMPRESSION: Bibasilar chronic changes by report. Increased density in the left mid lung which may represent acute on chronic infiltrate. Electronically Signed   By: Alcide CleverMark  Lukens M.D.   On: 05/18/2016 12:21    EKG: Orders placed or performed during the hospital encounter of 05/18/16  . EKG 12-Lead  . EKG 12-Lead    IMPRESSION AND PLAN:  * Jaundice   Most likely OBSTRUCTIVE- as wife said his stools turning light color.   We'll check hepatitis panel, get a right upper quadrant sonogram.   GI to help with further management.   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  * Lactic acidosis and sepsis due to pneumonia   Patient is immunosuppressed, so he is low white blood cell count is not reliable as a signal of infection.   He has tachycardia, tachypnea and hypoxia with finding of pneumonia on chest x-ray and high lactic acid.  ER physician gave her 1 dose of vancomycin and cefepime, I agree with that and will continue that. Follow lactic acid after IV fluids.   * Diabetes   We will hold oral medication as patient would be in liquid diet and keep on insulin sliding scale coverage.  * Hypertension   Hold medication because of presence with the lactic acidosis and sepsis.  * Lung fibrosis and chronic respiratory failure    Continue oxygen supplementation, and treat for pneumonia.     All the records are reviewed and case discussed with ED provider. Management plans discussed with the patient, family and they are  in agreement.  CODE STATUS: DO NOT RESUSCITATE Code Status History    This patient does not have a recorded code status. Please follow your organizational policy for patients in this situation.     Patient's wife is present in the room and I explained the findings and plan to patient and his wife both of them.  TOTAL TIME TAKING CARE OF THIS PATIENT: 50 critical care minutes.    Altamese DillingVACHHANI, Erminio Nygard M.D on 05/18/2016   Between 7am to 6pm - Pager - [phone]  After 6pm go to [email or web address] - password [email or web address]  [facility]  Office  [phone]  CC: Primary care physician; Dione Housekeeperlmedo, Mario Ernesto, MD   Note: This dictation was prepared with Dragon dictation along with smaller phrase technology. Any transcriptional errors that result from this process are unintentional.

## 2016-05-18 NOTE — Progress Notes (Signed)
Lab notified RN of a critical lab, lactic acid is 9.5. Dr. Elisabeth PigeonVachhani was made aware, no new orders given at this time.   Karsten RoLauren E Hobbs

## 2016-05-18 NOTE — Progress Notes (Signed)
Lab notified RN, pt has a critical lab lactic acid is 10, Dr. Nemiah CommanderKalisetti was notified no new orders given at this time. Relayed to the oncoming nurse.   Lee Johnson

## 2016-05-19 ENCOUNTER — Inpatient Hospital Stay: Payer: Medicare Other

## 2016-05-19 LAB — URINE CULTURE: Culture: NO GROWTH

## 2016-05-19 LAB — CBC
HEMATOCRIT: 21.4 % — AB (ref 40.0–52.0)
HEMOGLOBIN: 7.2 g/dL — AB (ref 13.0–18.0)
MCH: 36.7 pg — AB (ref 26.0–34.0)
MCHC: 33.6 g/dL (ref 32.0–36.0)
MCV: 109.5 fL — ABNORMAL HIGH (ref 80.0–100.0)
Platelets: 196 10*3/uL (ref 150–440)
RBC: 1.96 MIL/uL — ABNORMAL LOW (ref 4.40–5.90)
RDW: 22.1 % — ABNORMAL HIGH (ref 11.5–14.5)
WBC: 2.7 10*3/uL — ABNORMAL LOW (ref 3.8–10.6)

## 2016-05-19 LAB — BASIC METABOLIC PANEL
ANION GAP: 11 (ref 5–15)
BUN: 21 mg/dL — ABNORMAL HIGH (ref 6–20)
CO2: 23 mmol/L (ref 22–32)
Calcium: 8.2 mg/dL — ABNORMAL LOW (ref 8.9–10.3)
Chloride: 102 mmol/L (ref 101–111)
Creatinine, Ser: 0.63 mg/dL (ref 0.61–1.24)
GFR calc non Af Amer: >60 mL/min (ref >60)
Glucose, Bld: 94 mg/dL (ref 65–99)
POTASSIUM: 3.9 mmol/L (ref 3.5–5.1)
SODIUM: 136 mmol/L (ref 135–145)

## 2016-05-19 LAB — HEPATITIS PANEL, ACUTE
HCV Ab: <0.1 {s_co_ratio} (ref 0.0–0.9)
Hep A IgM: NEGATIVE
Hep B C IgM: NEGATIVE
Hepatitis B Surface Ag: NEGATIVE

## 2016-05-19 LAB — HEPATIC FUNCTION PANEL
ALBUMIN: 2.1 g/dL — AB (ref 3.5–5.0)
ALT: 100 U/L — AB (ref 17–63)
AST: 110 U/L — AB (ref 15–41)
Alkaline Phosphatase: 112 U/L (ref 38–126)
BILIRUBIN DIRECT: 4.4 mg/dL — AB (ref 0.1–0.5)
BILIRUBIN TOTAL: 7.3 mg/dL — AB (ref 0.3–1.2)
Indirect Bilirubin: 2.9 mg/dL — ABNORMAL HIGH (ref 0.3–0.9)
Total Protein: 4.7 g/dL — ABNORMAL LOW (ref 6.5–8.1)

## 2016-05-19 LAB — GLUCOSE, CAPILLARY
GLUCOSE-CAPILLARY: 212 mg/dL — AB (ref 65–99)
GLUCOSE-CAPILLARY: 196 mg/dL — AB (ref 65–99)
Glucose-Capillary: 85 mg/dL (ref 65–99)
Glucose-Capillary: 113 mg/dL — ABNORMAL HIGH (ref 65–99)

## 2016-05-19 LAB — MRSA PCR SCREENING: MRSA BY PCR: NEGATIVE

## 2016-05-19 LAB — OCCULT BLOOD X 1 CARD TO LAB, STOOL: FECAL OCCULT BLD: POSITIVE — AB

## 2016-05-19 NOTE — Evaluation (Signed)
Physical Therapy Evaluation Patient Details Name: Lee Johnson MRN: [id number] DOB: January 26, 1941 Today's Date: 05/19/2016   History of Present Illness  75 yo M presented to ED from PCP for SOB and jaundice. PMH includes polymyositis, pulmonary fibrosis, DM, HTN.  Clinical Impression  Pt demonstrated significant weakness and difficulty with mobility due to multiple co-morbidities and complications recently.He requires mod A for bed mobility and min A to maintain balance at EOB. LE strength grossly 3+/5. Activity tolerance is very low, as he fatigues quickly and experiences SOB and increased HR. Pt tends to breath shallow and fast, given cues for pursed lip breathing. For transferring from bed to chair, he demonstrated significant posterior lean requiring mod A and FWW for safety. With minimal activity HR increased to 140s and SpO2 ranged 97 to 99% on 2L. STR was discussed with pt as he is very deconditioned and will require extensive assistance for mobility at this time. He does not want to go to STR. PT educated pt on possibility of him or his wife hurting themselves if they attempted mobility by themselves at home. PT discussed with RN about pt's depression and possible need for further evaluation. Pt will benefit from skilled PT services to increase functional I and mobility for safe discharge.     Follow Up Recommendations SNF;Supervision for mobility/OOB    Equipment Recommendations  Rolling walker with 5" wheels;Other (comment) (pt has rollator but that is too unstable for him for now)    Recommendations for Other Services       Precautions / Restrictions Precautions Precautions: Fall Restrictions Weight Bearing Restrictions: No      Mobility  Bed Mobility Overal bed mobility: Needs Assistance Bed Mobility: Supine to Sit     Supine to sit: Mod assist;HOB elevated     General bed mobility comments: difficulty getting trunk upright, becomes anxious and  SOB  Transfers Overall transfer level: Needs assistance Equipment used: Rolling walker (2 wheeled) Transfers: Sit to/from [employer] Sit to Stand: Mod assist;From elevated surface Stand pivot transfers: Mod assist;From elevated surface       General transfer comment: pt pulls up on FWW which demonstrated decreased safety awarenss, cues for hand placement, anterior weight shifting to correct posterior lean, and keeping FWW close  Ambulation/Gait             General Gait Details: NT due to pt's elevated HR and fatigue with stand pivot transfer  Stairs            Wheelchair Mobility    Modified Rankin (Stroke Patients Only)       Balance Overall balance assessment: Needs assistance;History of Falls Sitting-balance support: Bilateral upper extremity supported;Feet supported Sitting balance-Leahy Scale: Poor Sitting balance - Comments: requires min A to maintain balance, poor posture and trunk stability Postural control: Posterior lean Standing balance support: Bilateral upper extremity supported Standing balance-Leahy Scale: Poor Standing balance comment: requires min to mod A to maintain balance, moderate posterior lean                             Pertinent Vitals/Pain Pain Assessment: No/denies pain    Home Living Family/patient expects to be discharged to:: Private residence Living Arrangements: Spouse/significant other Available Help at Discharge: Family Type of Home: House Home Access: Stairs to enter Entrance Stairs-Rails: Right Entrance Stairs-Number of Steps: 2 Home Layout: Two level;Able to live on main level with bedroom/bathroom Home Equipment: Walker - 4 wheels  Prior Function Level of Independence: Needs assistance   Gait / Transfers Assistance Needed: limited household ambulation with rollator  ADL's / Homemaking Assistance Needed: assistance from family for bathing and other ADLs due to extreme fatigue         Hand Dominance        Extremity/Trunk Assessment   Upper Extremity Assessment: Generalized weakness           Lower Extremity Assessment: Generalized weakness (grossly 3+/5)         Communication   Communication: No difficulties  Cognition Arousal/Alertness: Awake/alert Behavior During Therapy: Anxious Overall Cognitive Status: Within Functional Limits for tasks assessed                      General Comments General comments (skin integrity, edema, etc.): jaundice skin, fatigues quickly, HR increases with minimal activity with SOB    Exercises Other Exercises Other Exercises: B LE supine therex:ankle pumps, heel slides and hip abd slides x 10 each. Pt fatigues quickly and requires increase time to complete and therapeutic rest breaks for energy conservation.      Assessment/Plan    PT Assessment Patient needs continued PT services  PT Diagnosis Difficulty walking;Generalized weakness   PT Problem List Decreased strength;Decreased activity tolerance;Decreased balance;Decreased mobility;Decreased knowledge of use of DME;Decreased safety awareness;Cardiopulmonary status limiting activity  PT Treatment Interventions DME instruction;Gait training;Stair training;Therapeutic activities;Therapeutic exercise;Balance training;Neuromuscular re-education;Patient/family education   PT Goals (Current goals can be found in the Care Plan section) Acute Rehab PT Goals Patient Stated Goal: to feel better PT Goal Formulation: With patient Time For Goal Achievement: 06/02/16 Potential to Achieve Goals: Fair    Frequency Min 2X/week   Barriers to discharge Inaccessible home environment;Decreased caregiver support steps to enter, requires mod A for mobility    Co-evaluation               End of Session Equipment Utilized During Treatment: Gait belt;Oxygen Activity Tolerance: Patient limited by fatigue Patient left: in chair;with call bell/phone within  reach;with chair alarm set Nurse Communication: Mobility status;Other (comment) (elevated HR, possible psych needs?)         Time: 1425-1450 PT Time Calculation (min) (ACUTE ONLY): 25 min   Charges:   PT Evaluation $PT Eval Moderate Complexity: 1 Procedure PT Treatments $Therapeutic Exercise: 8-22 mins   PT G Codes:        Adelene IdlerMindy Jo Alaysia Lightle, PT, DPT  05/19/2016, 3:16 PM [phone]

## 2016-05-19 NOTE — Progress Notes (Signed)
Initial Nutrition Assessment  DOCUMENTATION CODES:   Not applicable  INTERVENTION:  -Monitor diet progression and intake -If unable to meet nutritional needs will add supplement   NUTRITION DIAGNOSIS:   Inadequate oral intake related to acute illness as evidenced by per patient/family report.    GOAL:   Patient will meet greater than or equal to 90% of their needs    MONITOR:   Diet advancement, PO intake  REASON FOR ASSESSMENT:   Malnutrition Screening Tool    ASSESSMENT:      Pt admitted with possible obstructive jaundice. Medications increased for polymyositis last month.    Past Medical History  Diagnosis Date  . Polymyositis (HCC)   . Pulmonary fibrosis (HCC)   . Diabetes mellitus without complication (HCC)   . Hypertension    Medications reviewed: aspart, NS at 3475ml/hr Labs reviewed: BUN 21, creatinine WDL, elevated LFTs noted  Pt's wife reports for the past month decreased intake. Would eat well one day then poor the next.  On the poor days still able to eat soup or sandwich or something light.   Nutrition-Focused physical exam completed. Findings are WDL for fat depletion, muscle depletion, and edema.     Diet Order:  Diet NPO time specified  Skin:  Reviewed, no issues  Last BM:  5/25  Height:   Ht Readings from Last 1 Encounters:  05/18/16 5\' 10"  (1.778 m)    Weight: Wife reports wt about 1 month ago of 185-188 pounds at MD office.  4% wt loss in the last month  Wt Readings from Last 1 Encounters:  05/18/16 179 lb 14.4 oz (81.602 kg)    Ideal Body Weight:     BMI:  Body mass index is 25.81 kg/(m^2).  Estimated Nutritional Needs:   Kcal:  2025-2430 kcals/d  Protein:  101-121 g/d  Fluid:  >/= 2 L/d  EDUCATION NEEDS:   No education needs identified at this time  Leoma Folds B. Freida BusmanAllen, RD, LDN [phone] (pager) Weekend/On-Call pager [phone])

## 2016-05-19 NOTE — Progress Notes (Signed)
Doctor Elisabeth PigeonVachhani was notified of pt having inspiratory wheezes on left upper lobe, that was new upon assessment. Will continue to monitor pt.   Lee Johnson

## 2016-05-19 NOTE — Consult Note (Signed)
Reason for Consult: Polymyositis  Referring Physician: Hospitalist Dr Ed BlalockVackhani  Kreg Evon Bertucci   HPI: 75 year old white male. 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 Positive Jo 1 antibody. Prior Cytoxan and Imuran. 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 Was on Imuran 150. Increased to 200 mg on 331. Consideration for Cytoxan CellCept or rituximab. Concern about PCP prophylaxis. Allergic to Bactrim. Did not go on other agents because of insurance issues Diabetes. On metformin. Increased glipizide on 425. Also on  Thiazide More short of breath and weaker in the last month. Examination is on 04/03/2016 were normal. White count was 6600 hemoglobin 11.9 The last month he has been more short winded. In the last week he has felt bad. Bowels change color. The last 3 days he became jaundiced. No abdominal pain or diarrhea. No fever or weight change. Was on pulmonary fibrosis stroke but that was been discontinued. Was admitted. Bilirubin 9.8. White count 2900 hemoglobin 9.2 platelets 274,000. Transaminase is elevated. Ultrasound shows no obstruction. Question cirrhosis  PMH: COPD. Diabetes. Polymyositis. Interstitial lung disease. Hypertension.  SURGICAL HISTORY: Cataracts. Hernia repair  Family History: Negative for connective tissue disease  Social History: No cigarettes or alcohol  Allergies:  Allergies  Allergen Reactions  . Atorvastatin     Other reaction(s): Muscle Pain  . Sulfa Antibiotics Diarrhea  . Sulfamethoxazole-Trimethoprim Diarrhea    Medications:  Scheduled: . ceFEPime (MAXIPIME) IV  1 g Intravenous Q12H  . insulin aspart  0-9 Units Subcutaneous TID WC  . predniSONE  10 mg Oral Daily  . vancomycin  1,000 mg Intravenous Q12H        ROS:No chest pain. No fever. No cough. No diarrhea. No nausea and vomiting. No joint swelling.   PHYSICAL EXAM: Blood pressure 128/58, pulse 70, temperature 98 F (36.7 C),  temperature source Oral, resp. rate 20, height 5\' 10"  (1.778 m), weight 81.602 kg (179 lb 14.4 oz), SpO2 100 %. Ill-appearing male. Jaundiced. Alert. Seen in bed. Sclera icteric. Oropharynx clear. Regular rhythm. No significant loud P2. Interstitial crackles half the way up bilaterally. No visceromegaly. Nontender abdomen. 1+ edema. No synovitis. 5 minus over 5 neck flexors and hip flexors. Reasonable grip bilaterally.  Assessment: Recent onset of painless jaundice, abnormal liver functions and worsening cytopenia. Suspect related to higher dose of Imuran or other drug reaction Possible underlying liver disease Polymyositis, Jo 1 antibody positive. Major involvement is progressive interstitial lung disease Diabetes  Recommendations: CPK.  Agree with discontinuing Imuran.  Temporarily boost prednisone to 20 mg a day to cover his interstitial lung disease At follow-up we'll need follow-up with his pulmonary physician and due to determine next immunosuppressive agent  Kandyce RudKERNODLE JR, Maxi W 05/19/2016, 10:45 AM

## 2016-05-19 NOTE — Progress Notes (Signed)
ANTIBIOTIC CONSULT NOTE - INITIAL  Pharmacy Consult for Vancomycin/renal adjust medications Indication: pneumonia  Allergies  Allergen Reactions  . Atorvastatin     Other reaction(s): Muscle Pain  . Sulfa Antibiotics Diarrhea  . Sulfamethoxazole-Trimethoprim Diarrhea    Patient Measurements: Height: 5\' 10"  (177.8 cm) Weight: 179 lb 14.4 oz (81.602 kg) IBW/kg (Calculated) : 73   Vital Signs: Temp: 98 F (36.7 C) (05/26 0619) Temp Source: Oral (05/26 0619) BP: 128/58 mmHg (05/26 0619) Pulse Rate: 70 (05/26 0619) Intake/Output from previous day: 05/25 0701 - 05/26 0700 In: 2157 [P.O.:120; I.V.:2037] Out: 900 [Urine:900] Intake/Output from this shift:    Labs:  Recent Labs  05/18/16 1150 05/19/16 0341  WBC 2.9*  --   HGB 9.2*  --   PLT 274  --   CREATININE 0.99 0.63   Estimated Creatinine Clearance: 83.6 mL/min (by C-G formula based on Cr of 0.63). No results for input(s): VANCOTROUGH, VANCOPEAK, VANCORANDOM, GENTTROUGH, GENTPEAK, GENTRANDOM, TOBRATROUGH, TOBRAPEAK, TOBRARND, AMIKACINPEAK, AMIKACINTROU, AMIKACIN in the last 72 hours.   Microbiology: No results found for this or any previous visit (from the past 720 hour(s)).  Medical History: Past Medical History  Diagnosis Date  . Polymyositis (HCC)   . Pulmonary fibrosis (HCC)   . Diabetes mellitus without complication (HCC)   . Hypertension     Medications:  Scheduled:  . ceFEPime (MAXIPIME) IV  1 g Intravenous Q12H  . insulin aspart  0-9 Units Subcutaneous TID WC  . predniSONE  10 mg Oral Daily  . vancomycin  1,000 mg Intravenous Q12H   Infusions:  . sodium chloride 75 mL/hr at 05/19/16 [id number]   Assessment: Pharmacy consulted to dose Vancomycin in a 75 yo male for HCAP. Patient received Vancomycin 1 gm IV once in ED at 1406.  Est CrCl~68 mL/min, ke: 0.061, t1/2: 11.36 h, Vd: 58.7 L  Goal of Therapy:  Vancomycin trough level 15-20 mcg/ml  Plan:  Will continue vancomycin 1 gm IV q12h. Will  check trough prior to 5th dose (should be at steady state) on 5/27 at 1930.    Pharmacy will continue to follow.   Luisa HartScott Madiha Bambrick, PharmD Clinical Pharmacist   05/19/2016

## 2016-05-19 NOTE — Progress Notes (Signed)
RN notified Dr. Elisabeth PigeonVachhani of pt hgb being 7.2 at this time. New orders to collect occult blood when pt is able. VSS at this time. Will continue to assess and monitor pt.   Lee RoLauren E Johnson

## 2016-05-19 NOTE — Care Management (Signed)
Sent to ED by PCP for jaundice and elevated liver enzymes. Patient lives with his wife and has a progressive polymyositis and  interstitial lung disease.  Polymyositis onset was around 10 years ago. patient has had and increase in weakness over the last 3 months.    He has a wheel chair walker and bedside commode, and home 02 through [facility].  PT consult is pending.  Patient says he will not even consider skilled nursing placement.  He is very agreeable to return home with home health.  Patient's wife pull CM aside to talk about possible hospice.  She says that no physician has told her patient had a predictable life expectancy "but I know his lung are no working and they will never recover they say."  Discussed that could be screened for hospice criteria and also discussed home health physical therapy.  She is familiar with [facility] but has heard that [facility] sees people.  Discussed with wife that patient would have to meet criteria for [facility] but could be investigated.  Patient is DNR.

## 2016-05-19 NOTE — Progress Notes (Signed)
[facility] [place] [facility]   PATIENT NAME: Lee AblerGeorge Johnson    MR#:  [id number]  DATE OF BIRTH:  March 01, 1941  SUBJECTIVE:  CHIEF COMPLAINT:   Chief Complaint  Patient presents with  . Jaundice     No pain, No BM yet.   No complains.  REVIEW OF SYSTEMS:  CONSTITUTIONAL: No fever,positive for fatigue or weakness.  EYES: No blurred or double vision.  EARS, NOSE, AND THROAT: No tinnitus or ear pain.  RESPIRATORY: No cough, shortness of breath, wheezing or hemoptysis.  CARDIOVASCULAR: No chest pain, orthopnea, edema.  GASTROINTESTINAL: No nausea, vomiting, diarrhea or abdominal pain.  GENITOURINARY: No dysuria, hematuria.  ENDOCRINE: No polyuria, nocturia,  HEMATOLOGY: No anemia, easy bruising or bleeding SKIN: No rash or lesion. MUSCULOSKELETAL: No joint pain or arthritis.   NEUROLOGIC: No tingling, numbness, weakness.  PSYCHIATRY: No anxiety or depression.   ROS  DRUG ALLERGIES:   Allergies  Allergen Reactions  . Atorvastatin     Other reaction(s): Muscle Pain  . Sulfa Antibiotics Diarrhea  . Sulfamethoxazole-Trimethoprim Diarrhea    VITALS:  Blood pressure 119/61, pulse 84, temperature 98.2 F (36.8 C), temperature source Oral, resp. rate 20, height 5\' 10"  (1.778 m), weight 81.602 kg (179 lb 14.4 oz), SpO2 99 %.  PHYSICAL EXAMINATION:   GENERAL: 75 y.o.-year-old patient lying in the bed with no acute distress.  EYES: Pupils equal, round, reactive to light and accommodation.Present scleral icterus. Extraocular muscles intact.  HEENT: Head atraumatic, normocephalic. Oropharynx and nasopharynx clear.  NECK: Supple, no jugular venous distention. No thyroid enlargement, no tenderness.  LUNGS: Normal breath sounds bilaterally, no wheezing,some crepitation. No use of accessory muscles of respiration. On supplemental oxygen via nasal cannula.  CARDIOVASCULAR: S1, S2 normal. No murmurs, rubs, or gallops.  ABDOMEN: Soft, nontender,  nondistended. Bowel sounds present. No organomegaly or mass.  EXTREMITIES: No pedal edema, cyanosis, or clubbing.  NEUROLOGIC: Cranial nerves II through XII are intact. Muscle strength 5/5 in all extremities. Sensation intact. Gait not checked.  PSYCHIATRIC: The patient is alert and oriented x 3.  SKIN: No obvious rash, lesion, or ulcer. Yellowish discoloration of his skin and sclera.  Physical Exam LABORATORY PANEL:   CBC  Recent Labs Lab 05/19/16 0341  WBC 2.7*  HGB 7.2*  HCT 21.4*  PLT 196   ------------------------------------------------------------------------------------------------------------------  Chemistries   Recent Labs Lab 05/19/16 0341  NA 136  K 3.9  CL 102  CO2 23  GLUCOSE 94  BUN 21*  CREATININE 0.63  CALCIUM 8.2*  AST 110*  ALT 100*  ALKPHOS 112  BILITOT 7.3*   ------------------------------------------------------------------------------------------------------------------  Cardiac Enzymes  Recent Labs Lab 05/18/16 1150  TROPONINI 0.03   ------------------------------------------------------------------------------------------------------------------  RADIOLOGY:  Dg Chest 2 View  05/18/2016  CLINICAL DATA:  Worsening shortness of Breath EXAM: CHEST  2 VIEW COMPARISON:  By report from 07/12/2015 FINDINGS: Cardiac shadow is enlarged. Bilateral lower lobe interstitial prominence is noted stable by report from the prior exam. Increased densities node in the left mid lung which may represent some acute on chronic infiltrate. No bony abnormality is seen. IMPRESSION: Bibasilar chronic changes by report. Increased density in the left mid lung which may represent acute on chronic infiltrate. Electronically Signed   By: Alcide CleverMark  Lukens M.D.   On: 05/18/2016 12:21   Mr Abdomen Mrcp Wo Cm  05/19/2016  CLINICAL DATA:  Cholestasis.  Elevated bili Rubin, AST and ALT. EXAM: MRI ABDOMEN WITHOUT CONTRAST  (INCLUDING MRCP) TECHNIQUE: Multiplanar multisequence  MR imaging  of the abdomen was performed. Heavily T2-weighted images of the biliary and pancreatic ducts were obtained, and three-dimensional MRCP images were rendered by post processing. COMPARISON:  None. FINDINGS: Lower chest: Small bilateral pleural effusions are identified. No acute findings. Hepatobiliary: Mild perihepatic ascites. No focal liver abnormality. No intrahepatic bile duct dilatation. Periportal edema is noted. The gallbladder appears collapsed. Diffuse pericholecystic fluid is identified. No biliary dilatation. No intrahepatic bile duct dilatation. No mass visualized on this unenhanced exam. Pancreas: There is diffuse edema involving the pancreas. The pancreatic duct has a normal caliber. No mass or fluid collection identified associated with the pancreas. Spleen:  Perisplenic ascites.  Within normal limits in size. Adrenals/Urinary Tract: No adrenal mass identified. Mild nonspecific perinephric fat stranding. No evidence of renal mass or hydronephrosis. Stomach/Bowel: The stomach an the visualized upper abdominal bowel loops have a normal caliber. Vascular/Lymphatic: No pathologically enlarged lymph nodes identified. No evidence of abdominal aortic aneurysm. Other:  None. Musculoskeletal:  No suspicious bone lesions identified. IMPRESSION: 1. Mild diffuse pancreas edema identified suggestive of acute pancreatitis. No pseudocysts identified. 2. The gallbladder is collapsed. No intra or extrahepatic bile duct dilatation. 3. Upper abdominal ascites. 4. Pleural effusions. Electronically Signed   By: Signa Kell M.D.   On: 05/19/2016 10:40   US Abdomen Limited Ruq  05/19/2016  CLINICAL DATA:  Jaundice. EXAM: US ABDOMEN LIMITED - RIGHT UPPER QUADRANT COMPARISON:  Chest x-ray 05/18/2016.  Ultrasound 11/10/2014. FINDINGS: Gallbladder: The gallbladder is not visualized. Increased echogenicity is noted in the region the gallbladder fossa. A mass lesion or air within the gallbladder fossa cannot be  excluded. IV and oral contrast enhanced CT of the abdomen pelvis suggested for further evaluation. Common bile duct: Diameter: 2.7 mm Liver: Liver echotexture is coarse. Contour is slightly nodular. Cirrhosis cannot be excluded. Portal vein is patent with normal directional flow. IMPRESSION: 1. Prominent echogenic focus noted in the region of the gallbladder fossa. Echogenic mass or air within the gallbladder fossa cannot be excluded. IV and oral contrast enhanced CT of the abdomen and pelvis suggested for further evaluation. No biliary distention. 2. Liver has a heterogeneous echotexture slightly nodular contour suggesting cirrhosis. Portal vein is patent with normal directional flow. Electronically Signed   By: Maisie Fus  Register   On: 05/19/2016 09:14    ASSESSMENT AND PLAN:   Principal Problem:   Jaundice  * Jaundice  as wife said his stools turning light color.  We'll check hepatitis panel,   GI to help with further management.  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   Likely due to medication.    RUQ sono and MRCP are without any clear abnormalities.  * Lactic acidosis and sepsis due to pneumonia  Patient is immunosuppressed, so he is low white blood cell count is not reliable as a signal of infection.  He has tachycardia, tachypnea and hypoxia with finding of pneumonia on chest x-ray and high lactic acid.  On vanc+ cefepime.  * Diabetes  We will hold oral medication as patient would be in liquid diet and keep on insulin sliding scale coverage.  * Hypertension  Hold medication because of presence with the lactic acidosis and sepsis.  * Lung fibrosis and chronic respiratory failure  Continue oxygen supplementation, and treat for pneumonia.   All the records are reviewed and case discussed with Care Management/Social Workerr. Management plans discussed with the patient, family and they are in agreement.  CODE STATUS: Full  TOTAL TIME TAKING CARE OF  THIS PATIENT: 35 minutes.     POSSIBLE D/C IN 2-3 DAYS, DEPENDING ON CLINICAL CONDITION.   Altamese Dilling M.D on 05/19/2016   Between 7am to 6pm - Pager - [phone]  After 6pm go to [email or web address] - password [email or web address]  [facility]  Office  [phone]  CC: Primary care physician; Dione Housekeeper, MD  Note: This dictation was prepared with Dragon dictation along with smaller phrase technology. Any transcriptional errors that result from this process are unintentional.

## 2016-05-20 LAB — COMPREHENSIVE METABOLIC PANEL
ALBUMIN: 2.1 g/dL — AB (ref 3.5–5.0)
ALK PHOS: 110 U/L (ref 38–126)
ALT: 98 U/L — ABNORMAL HIGH (ref 17–63)
AST: 100 U/L — AB (ref 15–41)
Anion gap: 5 (ref 5–15)
BILIRUBIN TOTAL: 8.8 mg/dL — AB (ref 0.3–1.2)
BUN: 16 mg/dL (ref 6–20)
CALCIUM: 8.2 mg/dL — AB (ref 8.9–10.3)
CO2: 30 mmol/L (ref 22–32)
CREATININE: 0.5 mg/dL — AB (ref 0.61–1.24)
Chloride: 102 mmol/L (ref 101–111)
GFR calc Af Amer: >60 mL/min (ref >60)
GLUCOSE: 151 mg/dL — AB (ref 65–99)
POTASSIUM: 3.7 mmol/L (ref 3.5–5.1)
Sodium: 137 mmol/L (ref 135–145)
TOTAL PROTEIN: 4.6 g/dL — AB (ref 6.5–8.1)

## 2016-05-20 LAB — LACTIC ACID, PLASMA: LACTIC ACID, VENOUS: 1.9 mmol/L (ref 0.5–2.0)

## 2016-05-20 LAB — CK: Total CK: 24 U/L — ABNORMAL LOW (ref 49–397)

## 2016-05-20 LAB — GLUCOSE, CAPILLARY
Glucose-Capillary: 154 mg/dL — ABNORMAL HIGH (ref 65–99)
Glucose-Capillary: 251 mg/dL — ABNORMAL HIGH (ref 65–99)
Glucose-Capillary: 222 mg/dL — ABNORMAL HIGH (ref 65–99)
Glucose-Capillary: 202 mg/dL — ABNORMAL HIGH (ref 65–99)

## 2016-05-20 MED ORDER — PREDNISONE 20 MG PO TABS
20.0000 mg | ORAL_TABLET | Freq: Every day | ORAL | Status: DC
  Administered 2016-05-21: 20 mg via ORAL
  Filled 2016-05-20: qty 1

## 2016-05-20 NOTE — Progress Notes (Signed)
ANTIBIOTIC CONSULT NOTE - INITIAL  Pharmacy Consult for Vancomycin/renal adjust medications Indication: pneumonia  Allergies  Allergen Reactions  . Atorvastatin     Other reaction(s): Muscle Pain  . Sulfa Antibiotics Diarrhea  . Sulfamethoxazole-Trimethoprim Diarrhea    Patient Measurements: Height:  (177.8 cm) Weight: 179 lb 14.4 oz (81.602 kg) IBW/kg (Calculated) : 73   Vital Signs: Temp: 98.3 F (36.8 C) (05/27 0433) Temp Source: Oral (05/27 0433) BP: 112/59 mmHg (05/27 0433) Pulse Rate: 65 (05/27 0433) Intake/Output from previous day: 05/26 0701 - 05/27 0700 In: 1603 [P.O.:340; I.V.:1263] Out: 1500 [Urine:1500] Intake/Output from this shift: Total I/O In: 240 [P.O.:240] Out: -   Labs:  Recent Labs  05/18/16 1150 05/19/16 0341 05/20/16 0603  WBC 2.9* 2.7*  --   HGB 9.2* 7.2*  --   PLT 274 196  --   CREATININE 0.99 0.63 0.50*   Estimated Creatinine Clearance: 83.6 mL/min (by C-G formula based on Cr of 0.5). No results for input(s): VANCOTROUGH, VANCOPEAK, VANCORANDOM, GENTTROUGH, GENTPEAK, GENTRANDOM, TOBRATROUGH, TOBRAPEAK, TOBRARND, AMIKACINPEAK, AMIKACINTROU, AMIKACIN in the last 72 hours.   Microbiology: Recent Results (from the past 720 hour(s))  Blood culture (routine x 2)     Status: None (Preliminary result)   Collection Time: 05/18/16  1:00 PM  Result Value Ref Range Status   Specimen Description BLOOD LEFT ASSIST CONTROL  Final   Special Requests BOTTLES DRAWN AEROBIC AND ANAEROBIC 2CCAERO,2CCANA  Final   Culture NO GROWTH 2 DAYS  Final   Report Status PENDING  Incomplete  Blood culture (routine x 2)     Status: None (Preliminary result)   Collection Time: 05/18/16  1:00 PM  Result Value Ref Range Status   Specimen Description BLOOD RIGHT ASSIST CONTROL  Final   Special Requests BOTTLES DRAWN AEROBIC AND ANAEROBIC 2CCAERO,2CCANA  Final   Culture NO GROWTH 2 DAYS  Final   Report Status PENDING  Incomplete  Urine culture     Status:  None   Collection Time: 05/18/16  1:42 PM  Result Value Ref Range Status   Specimen Description URINE, RANDOM  Final   Special Requests NONE  Final   Culture NO GROWTH Performed at [facility]   Final   Report Status 05/19/2016 FINAL  Final  MRSA PCR Screening     Status: None   Collection Time: 05/19/16  3:20 PM  Result Value Ref Range Status   MRSA by PCR NEGATIVE NEGATIVE Final    Comment:        The GeneXpert MRSA Assay (FDA approved for NASAL specimens only), is one component of a comprehensive MRSA colonization surveillance program. It is not intended to diagnose MRSA infection nor to guide or monitor treatment for MRSA infections.     Medical History: Past Medical History  Diagnosis Date  . Polymyositis (HCC)   . Pulmonary fibrosis (HCC)   . Diabetes mellitus without complication (HCC)   . Hypertension     Medications:  Scheduled:  . ceFEPime (MAXIPIME) IV  1 g Intravenous Q12H  . insulin aspart  0-9 Units Subcutaneous TID WC  . predniSONE  10 mg Oral Daily   Infusions:  . sodium chloride 75 mL/hr at 05/19/16 2126   Assessment: Pharmacy consulted to dose Vancomycin in a 75 yo male for HCAP. Patient received Vancomycin 1 gm IV once in ED at 1406.  Est CrCl~68 mL/min, ke: 0.061, t1/2: 11.36 h, Vd: 58.7 L  Goal of Therapy:  Vancomycin trough level 15-20 mcg/ml  Plan:  Will D/c vancomycin as MRSA PCR is negative.   Will continue cefepime.  Roque CashAllison Makiah Foye, PharmD Pharmacy Resident 05/20/2016

## 2016-05-20 NOTE — Consult Note (Signed)
Follow up Note Referring Provider:     Altamese Dilling, MD Primary Care Physician:  Dione Housekeeper, MD     Reason for Consultation:     Abnormal liver enzymes  Date of Admission:  05/18/2016 Date of Follow Up:  05/20/2016   Subjective:  Pt without complaints this am  Past Medical History  Diagnosis Date  . Polymyositis (HCC)   . Pulmonary fibrosis (HCC)   . Diabetes mellitus without complication (HCC)   . Hypertension     History reviewed. No pertinent past surgical history.  Prior to Admission medications   Medication Sig Start Date End Date Taking? Authorizing Provider  Acetaminophen 500 MG coapsule Take 1,000 mg by mouth daily. Pt also takes PRN   Yes Historical Provider, MD  aspirin EC 81 MG tablet Take 81 mg by mouth daily.    Yes Historical Provider, MD  azaTHIOprine (IMURAN) 50 MG tablet Take 2 tablets by mouth 2 (two) times daily. 03/27/16  Yes Historical Provider, MD  betamethasone valerate lotion (VALISONE) 0.1 % Apply 1 application topically as needed. apply to face 10/07/13  Yes Historical Provider, MD  Calcium Carbonate-Vitamin D 600-400 MG-UNIT tablet Take 1 tablet by mouth daily.    Yes Historical Provider, MD  Cholecalciferol (VITAMIN D-1000 MAX ST) 1000 units tablet Take 1,000 Units by mouth daily.    Yes Historical Provider, MD  enalapril-hydrochlorothiazide (VASERETIC) 10-25 MG tablet Take 0.5 tablets by mouth daily.  12/06/15 12/05/16 Yes Historical Provider, MD  esomeprazole (NEXIUM) 20 MG capsule Take 20 mg by mouth daily.    Yes Historical Provider, MD  Flaxseed, Linseed, (FLAXSEED OIL) 1000 MG CAPS Take 1 capsule by mouth 2 (two) times daily.    Yes Historical Provider, MD  glipiZIDE (GLUCOTROL XL) 10 MG 24 hr tablet Take 10 mg by mouth daily after supper.  05/18/16  Yes Historical Provider, MD  glipiZIDE (GLUCOTROL) 10 MG tablet Take 10 mg by mouth daily.  05/05/16 05/05/17 Yes Historical Provider, MD  glucose (SUNMARK GLUCOSE) 4 GM chewable tablet  Chew 1 tablet by mouth as needed. For low blood sugar 03/21/16 03/21/17 Yes Historical Provider, MD  metFORMIN (GLUCOPHAGE-XR) 500 MG 24 hr tablet Take 2 tablets by mouth 2 (two) times daily after a meal. 12/06/15 12/05/16 Yes Historical Provider, MD  Omega-3 Fatty Acids (FISH OIL) 1000 MG CAPS Take 1 capsule by mouth 2 (two) times daily.   Yes Historical Provider, MD  predniSONE (DELTASONE) 5 MG tablet Take 2 tablets by mouth daily.   Yes Historical Provider, MD  tamsulosin (FLOMAX) 0.4 MG CAPS capsule Take 1 capsule by mouth daily after supper. 07/27/15 07/26/16 Yes Historical Provider, MD      Physical Exam:  Vital signs in last 24 hours: Temp:  [98.2 F (36.8 C)-99 F (37.2 C)] 98.2 F (36.8 C) (05/27 1206) Pulse Rate:  [65-73] 67 (05/27 1206) Resp:  [16-22] 16 (05/27 1206) BP: (102-112)/(55-62) 112/62 mmHg (05/27 1206) SpO2:  [99 %-100 %] 100 % (05/27 1206) Last BM Date: 05/19/16  General:   Pleasant, cooperative in NAD Head:  Normocephalic and atraumatic. Eyes:   Positive scleral  icterus.   Conjunctiva yellow. PERRLA. Lungs: Respirations even and unlabored. Lungs clear to auscultation bilaterally.   No wheezes, crackles, or rhonchi.  Heart:  Regular rate and rhythm;  Without murmur, clicks, rubs or gallops Abdomen:  Soft, nondistended, nontender. Normal bowel sounds. No appreciable masses or hepatomegaly.  No rebound or guarding.  Msk:  Symmetrical without gross deformities.  Extremities:  Without edema, cyanosis or clubbing. Neurologic:  Alert and oriented x3;  grossly normal neurologically.  LAB RESULTS:  Recent Labs  05/18/16 1150 05/19/16 0341  WBC 2.9* 2.7*  HGB 9.2* 7.2*  HCT 27.7* 21.4*  PLT 274 196   BMET  Recent Labs  05/18/16 1150 05/19/16 0341 05/20/16 0603  NA 137 136 137  K 4.4 3.9 3.7  CL 95* 102 102  CO2 20* 23 30  GLUCOSE 108* 94 151*  BUN 31* 21* 16  CREATININE 0.99 0.63 0.50*  CALCIUM 9.5 8.2* 8.2*   LFT  Recent Labs  05/19/16 0341  05/20/16 0603  PROT 4.7* 4.6*  ALBUMIN 2.1* 2.1*  AST 110* 100*  ALT 100* 98*  ALKPHOS 112 110  BILITOT 7.3* 8.8*  BILIDIR 4.4*  --   IBILI 2.9*  --    PT/INR  Recent Labs  05/18/16 1150  LABPROT 16.2*  INR 1.29    STUDIES: Mr Abdomen Mrcp Wo Cm  05/19/2016  CLINICAL DATA:  Cholestasis.  Elevated bili Rubin, AST and ALT. EXAM: MRI ABDOMEN WITHOUT CONTRAST  (INCLUDING MRCP) TECHNIQUE: Multiplanar multisequence MR imaging of the abdomen was performed. Heavily T2-weighted images of the biliary and pancreatic ducts were obtained, and three-dimensional MRCP images were rendered by post processing. COMPARISON:  None. FINDINGS: Lower chest: Small bilateral pleural effusions are identified. No acute findings. Hepatobiliary: Mild perihepatic ascites. No focal liver abnormality. No intrahepatic bile duct dilatation. Periportal edema is noted. The gallbladder appears collapsed. Diffuse pericholecystic fluid is identified. No biliary dilatation. No intrahepatic bile duct dilatation. No mass visualized on this unenhanced exam. Pancreas: There is diffuse edema involving the pancreas. The pancreatic duct has a normal caliber. No mass or fluid collection identified associated with the pancreas. Spleen:  Perisplenic ascites.  Within normal limits in size. Adrenals/Urinary Tract: No adrenal mass identified. Mild nonspecific perinephric fat stranding. No evidence of renal mass or hydronephrosis. Stomach/Bowel: The stomach an the visualized upper abdominal bowel loops have a normal caliber. Vascular/Lymphatic: No pathologically enlarged lymph nodes identified. No evidence of abdominal aortic aneurysm. Other:  None. Musculoskeletal:  No suspicious bone lesions identified. IMPRESSION: 1. Mild diffuse pancreas edema identified suggestive of acute pancreatitis. No pseudocysts identified. 2. The gallbladder is collapsed. No intra or extrahepatic bile duct dilatation. 3. Upper abdominal ascites. 4. Pleural effusions.  Electronically Signed   By: Signa Kellaylor  Stroud M.D.   On: 05/19/2016 10:40   [place] Abdomen Limited Ruq  05/19/2016  CLINICAL DATA:  Jaundice. EXAM: US ABDOMEN LIMITED - RIGHT UPPER QUADRANT COMPARISON:  Chest x-ray 05/18/2016.  Ultrasound 11/10/2014. FINDINGS: Gallbladder: The gallbladder is not visualized. Increased echogenicity is noted in the region the gallbladder fossa. A mass lesion or air within the gallbladder fossa cannot be excluded. IV and oral contrast enhanced CT of the abdomen pelvis suggested for further evaluation. Common bile duct: Diameter: 2.7 mm Liver: Liver echotexture is coarse. Contour is slightly nodular. Cirrhosis cannot be excluded. Portal vein is patent with normal directional flow. IMPRESSION: 1. Prominent echogenic focus noted in the region of the gallbladder fossa. Echogenic mass or air within the gallbladder fossa cannot be excluded. IV and oral contrast enhanced CT of the abdomen and pelvis suggested for further evaluation. No biliary distention. 2. Liver has a heterogeneous echotexture slightly nodular contour suggesting cirrhosis. Portal vein is patent with normal directional flow. Electronically Signed   By: Maisie Fushomas  Register   On: 05/19/2016 09:14      Impression / Plan:  Lee Johnson  is a 75 y.o. y/o male with a history of polymyositis, liver fibrosis diabetes and hypertension. The patient was found to have an elevated bilirubin with increased AST and ALT on admission, with imaging suggestive of a new diagnosis of cirrhosis. He has multiple labs pending.   For now, recommend continued monitoring of LFTs, INR, while awaiting labs for evaluation of etiology of liver disease. Can make further recommendations based on this evaluation.   LOS: 2 days   Colette Ribas, MD  05/20/2016, 2:26 PM

## 2016-05-20 NOTE — Progress Notes (Signed)
[facility] - [place] at [facility]   PATIENT NAME: Lee Johnson    MR#:  [id number]  DATE OF BIRTH:  11-20-1941  SUBJECTIVE:  CHIEF COMPLAINT:   Chief Complaint  Patient presents with  . Jaundice     No pain, No BM yet.   No complains.    Tolerating clear liquids.   Her generalized weakness and not able to walk, but he said for many months he is not walking.  REVIEW OF SYSTEMS:  CONSTITUTIONAL: No fever,positive for fatigue or weakness.  EYES: No blurred or double vision.  EARS, NOSE, AND THROAT: No tinnitus or ear pain.  RESPIRATORY: No cough, shortness of breath, wheezing or hemoptysis.  CARDIOVASCULAR: No chest pain, orthopnea, edema.  GASTROINTESTINAL: No nausea, vomiting, diarrhea or abdominal pain.  GENITOURINARY: No dysuria, hematuria.  ENDOCRINE: No polyuria, nocturia,  HEMATOLOGY: No anemia, easy bruising or bleeding SKIN: No rash or lesion. MUSCULOSKELETAL: No joint pain or arthritis.   NEUROLOGIC: No tingling, numbness, weakness.  PSYCHIATRY: No anxiety or depression.   ROS  DRUG ALLERGIES:   Allergies  Allergen Reactions  . Atorvastatin     Other reaction(s): Muscle Pain  . Sulfa Antibiotics Diarrhea  . Sulfamethoxazole-Trimethoprim Diarrhea    VITALS:  Blood pressure 112/62, pulse 67, temperature 98.2 F (36.8 C), temperature source Oral, resp. rate 16, height 5\' 10"  (1.778 m), weight 81.602 kg (179 lb 14.4 oz), SpO2 100 %.  PHYSICAL EXAMINATION:   GENERAL: 75 y.o.-year-old patient lying in the bed with no acute distress.  EYES: Pupils equal, round, reactive to light and accommodation.Present scleral icterus. Extraocular muscles intact.  HEENT: Head atraumatic, normocephalic. Oropharynx and nasopharynx clear.  NECK: Supple, no jugular venous distention. No thyroid enlargement, no tenderness.  LUNGS: Normal breath sounds bilaterally, no wheezing,some crepitation. No use of accessory muscles of respiration. On supplemental  oxygen via nasal cannula.  CARDIOVASCULAR: S1, S2 normal. No murmurs, rubs, or gallops.  ABDOMEN: Soft, nontender, nondistended. Bowel sounds present. No organomegaly or mass.  EXTREMITIES: No pedal edema, cyanosis, or clubbing.  NEUROLOGIC: Cranial nerves II through XII are intact. Muscle strength 4/5 in all extremities. Sensation intact. Gait not checked.  PSYCHIATRIC: The patient is alert and oriented x 3.  SKIN: No obvious rash, lesion, or ulcer. Yellowish discoloration of his skin and sclera.  Physical Exam LABORATORY PANEL:   CBC  Recent Labs Lab 05/19/16 0341  WBC 2.7*  HGB 7.2*  HCT 21.4*  PLT 196   ------------------------------------------------------------------------------------------------------------------  Chemistries   Recent Labs Lab 05/20/16 0603  NA 137  K 3.7  CL 102  CO2 30  GLUCOSE 151*  BUN 16  CREATININE 0.50*  CALCIUM 8.2*  AST 100*  ALT 98*  ALKPHOS 110  BILITOT 8.8*   ------------------------------------------------------------------------------------------------------------------  Cardiac Enzymes  Recent Labs Lab 05/18/16 1150  TROPONINI 0.03   ------------------------------------------------------------------------------------------------------------------  RADIOLOGY:  Mr Abdomen Mrcp Wo Cm  05/19/2016  CLINICAL DATA:  Cholestasis.  Elevated bili Rubin, AST and ALT. EXAM: MRI ABDOMEN WITHOUT CONTRAST  (INCLUDING MRCP) TECHNIQUE: Multiplanar multisequence MR imaging of the abdomen was performed. Heavily T2-weighted images of the biliary and pancreatic ducts were obtained, and three-dimensional MRCP images were rendered by post processing. COMPARISON:  None. FINDINGS: Lower chest: Small bilateral pleural effusions are identified. No acute findings. Hepatobiliary: Mild perihepatic ascites. No focal liver abnormality. No intrahepatic bile duct dilatation. Periportal edema is noted. The gallbladder appears collapsed. Diffuse  pericholecystic fluid is identified. No biliary dilatation. No intrahepatic bile duct dilatation. No  mass visualized on this unenhanced exam. Pancreas: There is diffuse edema involving the pancreas. The pancreatic duct has a normal caliber. No mass or fluid collection identified associated with the pancreas. Spleen:  Perisplenic ascites.  Within normal limits in size. Adrenals/Urinary Tract: No adrenal mass identified. Mild nonspecific perinephric fat stranding. No evidence of renal mass or hydronephrosis. Stomach/Bowel: The stomach an the visualized upper abdominal bowel loops have a normal caliber. Vascular/Lymphatic: No pathologically enlarged lymph nodes identified. No evidence of abdominal aortic aneurysm. Other:  None. Musculoskeletal:  No suspicious bone lesions identified. IMPRESSION: 1. Mild diffuse pancreas edema identified suggestive of acute pancreatitis. No pseudocysts identified. 2. The gallbladder is collapsed. No intra or extrahepatic bile duct dilatation. 3. Upper abdominal ascites. 4. Pleural effusions. Electronically Signed   By: Signa Kell M.D.   On: 05/19/2016 10:40   US Abdomen Limited Ruq  05/19/2016  CLINICAL DATA:  Jaundice. EXAM: US ABDOMEN LIMITED - RIGHT UPPER QUADRANT COMPARISON:  Chest x-ray 05/18/2016.  Ultrasound 11/10/2014. FINDINGS: Gallbladder: The gallbladder is not visualized. Increased echogenicity is noted in the region the gallbladder fossa. A mass lesion or air within the gallbladder fossa cannot be excluded. IV and oral contrast enhanced CT of the abdomen pelvis suggested for further evaluation. Common bile duct: Diameter: 2.7 mm Liver: Liver echotexture is coarse. Contour is slightly nodular. Cirrhosis cannot be excluded. Portal vein is patent with normal directional flow. IMPRESSION: 1. Prominent echogenic focus noted in the region of the gallbladder fossa. Echogenic mass or air within the gallbladder fossa cannot be excluded. IV and oral contrast enhanced CT of the  abdomen and pelvis suggested for further evaluation. No biliary distention. 2. Liver has a heterogeneous echotexture slightly nodular contour suggesting cirrhosis. Portal vein is patent with normal directional flow. Electronically Signed   By: Maisie Fus  Register   On: 05/19/2016 09:14    ASSESSMENT AND PLAN:   Principal Problem:   Jaundice  * Jaundice  as wife said his stools turning light color.  check hepatitis panel,   GI to help with further management.  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   Likely due to medication.    RUQ sono and MRCP are without any clear abnormalities.   Monitor LFTs for improvement, serial INR to follow.  * Lactic acidosis and sepsis due to pneumonia  Patient is immunosuppressed, so he is low white blood cell count is not reliable as a signal of infection.  He has tachycardia, tachypnea and hypoxia with finding of pneumonia on chest x-ray and high lactic acid.  On vanc+ cefepime.   MRSA screen is negative- so stop vanc.  * Diabetes  We will hold oral medication as patient would be in liquid diet and keep on insulin sliding scale coverage.  * Hypertension  Hold medication because of presence with the lactic acidosis and sepsis.   BP stable, lactic acidosis resolved.  * Lung fibrosis and chronic respiratory failure  Continue oxygen supplementation, and treat for pneumonia.   As per Rheumatology- increased prednisone to 20 mg daily.   All the records are reviewed and case discussed with Care Management/Social Workerr. Management plans discussed with the patient, family and they are in agreement.  CODE STATUS: Full  TOTAL TIME TAKING CARE OF THIS PATIENT: 35 minutes.    POSSIBLE D/C IN 2-3 DAYS, DEPENDING ON CLINICAL CONDITION. Spoke to his wife and 2 daughters in room today.  Altamese Dilling M.D on 05/20/2016   Between 7am to 6pm -  Pager - [phone]  After 6pm go to [email or web address] - password [email or web address]  [facility]  Office  [phone]  CC: Primary care physician; Dione Housekeeper, MD  Note: This dictation was prepared with Dragon dictation along with smaller phrase technology. Any transcriptional errors that result from this process are unintentional.

## 2016-05-21 LAB — COMPREHENSIVE METABOLIC PANEL WITH GFR
ALT: 98 U/L — ABNORMAL HIGH (ref 17–63)
AST: 98 U/L — ABNORMAL HIGH (ref 15–41)
Albumin: 2 g/dL — ABNORMAL LOW (ref 3.5–5.0)
Alkaline Phosphatase: 110 U/L (ref 38–126)
Anion gap: 5 (ref 5–15)
BUN: 15 mg/dL (ref 6–20)
CO2: 31 mmol/L (ref 22–32)
Calcium: 8.1 mg/dL — ABNORMAL LOW (ref 8.9–10.3)
Chloride: 101 mmol/L (ref 101–111)
Creatinine, Ser: 0.58 mg/dL — ABNORMAL LOW (ref 0.61–1.24)
GFR calc Af Amer: >60 mL/min
GFR calc non Af Amer: >60 mL/min
Glucose, Bld: 165 mg/dL — ABNORMAL HIGH (ref 65–99)
Potassium: 3.3 mmol/L — ABNORMAL LOW (ref 3.5–5.1)
Sodium: 137 mmol/L (ref 135–145)
Total Bilirubin: 9.2 mg/dL — ABNORMAL HIGH (ref 0.3–1.2)
Total Protein: 4.4 g/dL — ABNORMAL LOW (ref 6.5–8.1)

## 2016-05-21 LAB — PROTIME-INR
INR: 1.26
Prothrombin Time: 15.9 seconds — ABNORMAL HIGH (ref 11.4–15.0)

## 2016-05-21 LAB — GLUCOSE, CAPILLARY
GLUCOSE-CAPILLARY: 248 mg/dL — AB (ref 65–99)
GLUCOSE-CAPILLARY: 337 mg/dL — AB (ref 65–99)
Glucose-Capillary: 173 mg/dL — ABNORMAL HIGH (ref 65–99)
Glucose-Capillary: 271 mg/dL — ABNORMAL HIGH (ref 65–99)

## 2016-05-21 MED ORDER — INSULIN ASPART 100 UNIT/ML ~~LOC~~ SOLN
0.0000 [IU] | Freq: Three times a day (TID) | SUBCUTANEOUS | Status: DC
  Administered 2016-05-21 – 2016-05-22 (×2): 7 [IU] via SUBCUTANEOUS
  Administered 2016-05-22: 2 [IU] via SUBCUTANEOUS
  Administered 2016-05-22: 5 [IU] via SUBCUTANEOUS
  Administered 2016-05-23: 2 [IU] via SUBCUTANEOUS
  Administered 2016-05-23 (×2): 7 [IU] via SUBCUTANEOUS
  Administered 2016-05-24: 3 [IU] via SUBCUTANEOUS
  Administered 2016-05-24 – 2016-05-25 (×3): 2 [IU] via SUBCUTANEOUS
  Administered 2016-05-25: 1 [IU] via SUBCUTANEOUS
  Administered 2016-05-26: 3 [IU] via SUBCUTANEOUS
  Administered 2016-05-26: 2 [IU] via SUBCUTANEOUS
  Administered 2016-05-26 – 2016-05-27 (×2): 1 [IU] via SUBCUTANEOUS
  Filled 2016-05-21: qty 5
  Filled 2016-05-21: qty 3
  Filled 2016-05-21: qty 7
  Filled 2016-05-21: qty 2
  Filled 2016-05-21 (×2): qty 1
  Filled 2016-05-21 (×2): qty 2
  Filled 2016-05-21: qty 7
  Filled 2016-05-21 (×2): qty 2
  Filled 2016-05-21: qty 7
  Filled 2016-05-21: qty 2
  Filled 2016-05-21: qty 1
  Filled 2016-05-21: qty 3
  Filled 2016-05-21: qty 1
  Filled 2016-05-21: qty 7

## 2016-05-21 MED ORDER — PREDNISONE 10 MG PO TABS
10.0000 mg | ORAL_TABLET | Freq: Two times a day (BID) | ORAL | Status: DC
  Administered 2016-05-22 – 2016-05-27 (×10): 10 mg via ORAL
  Filled 2016-05-21 (×10): qty 1

## 2016-05-21 NOTE — Plan of Care (Signed)
Problem: Safety: Goal: Ability to remain free from injury will improve Outcome: Progressing Pt remains free from falls, call bell within reach. Wife at bedside.   Problem: Pain Managment: Goal: General experience of comfort will improve Outcome: Progressing Pt denies pain.  Problem: Physical Regulation: Goal: Will remain free from infection Outcome: Progressing Cont to monitor labs, temp, and pt for s/s of infection.  Problem: Nutrition: Goal: Adequate nutrition will be maintained Outcome: Progressing Pt remains on CLD, pt denies nausea.

## 2016-05-21 NOTE — Progress Notes (Signed)
[facility] - [place] at [facility]   PATIENT NAME: Lee Johnson    MR#:  [id number]  DATE OF BIRTH:  09/05/1941  SUBJECTIVE:  CHIEF COMPLAINT:   Chief Complaint  Patient presents with  . Jaundice     No pain, No BM yet.   No complains.    Tolerating clear liquids.   Her generalized weakness and not able to walk, but he said for many months he is not walking.   Liver enzymes are coming down but bilirubin still going up.  REVIEW OF SYSTEMS:  CONSTITUTIONAL: No fever,positive for fatigue or weakness.  EYES: No blurred or double vision.  EARS, NOSE, AND THROAT: No tinnitus or ear pain.  RESPIRATORY: No cough, shortness of breath, wheezing or hemoptysis.  CARDIOVASCULAR: No chest pain, orthopnea, edema.  GASTROINTESTINAL: No nausea, vomiting, diarrhea or abdominal pain.  GENITOURINARY: No dysuria, hematuria.  ENDOCRINE: No polyuria, nocturia,  HEMATOLOGY: No anemia, easy bruising or bleeding SKIN: No rash or lesion. MUSCULOSKELETAL: No joint pain or arthritis.   NEUROLOGIC: No tingling, numbness, weakness.  PSYCHIATRY: No anxiety or depression.   ROS  DRUG ALLERGIES:   Allergies  Allergen Reactions  . Atorvastatin     Other reaction(s): Muscle Pain  . Sulfa Antibiotics Diarrhea  . Sulfamethoxazole-Trimethoprim Diarrhea    VITALS:  Blood pressure 116/68, pulse 67, temperature 98.1 F (36.7 C), temperature source Oral, resp. rate 18, height 5\' 10"  (1.778 m), weight 81.602 kg (179 lb 14.4 oz), SpO2 98 %.  PHYSICAL EXAMINATION:   GENERAL: 75 y.o.-year-old patient lying in the bed with no acute distress.  EYES: Pupils equal, round, reactive to light and accommodation.Present scleral icterus. Extraocular muscles intact.  HEENT: Head atraumatic, normocephalic. Oropharynx and nasopharynx clear.  NECK: Supple, no jugular venous distention. No thyroid enlargement, no tenderness.  LUNGS: Normal breath sounds bilaterally, no wheezing,some crepitation.  No use of accessory muscles of respiration. On supplemental oxygen via nasal cannula.  CARDIOVASCULAR: S1, S2 normal. No murmurs, rubs, or gallops.  ABDOMEN: Soft, nontender, nondistended. Bowel sounds present. No organomegaly or mass.  EXTREMITIES: No pedal edema, cyanosis, or clubbing.  NEUROLOGIC: Cranial nerves II through XII are intact. Muscle strength 4/5 in all extremities. Sensation intact. Gait not checked.  PSYCHIATRIC: The patient is alert and oriented x 3.  SKIN: No obvious rash, lesion, or ulcer. Yellowish discoloration of his skin and sclera.  Physical Exam LABORATORY PANEL:   CBC  Recent Labs Lab 05/19/16 0341  WBC 2.7*  HGB 7.2*  HCT 21.4*  PLT 196   ------------------------------------------------------------------------------------------------------------------  Chemistries   Recent Labs Lab 05/21/16 0529  NA 137  K 3.3*  CL 101  CO2 31  GLUCOSE 165*  BUN 15  CREATININE 0.58*  CALCIUM 8.1*  AST 98*  ALT 98*  ALKPHOS 110  BILITOT 9.2*   ------------------------------------------------------------------------------------------------------------------  Cardiac Enzymes  Recent Labs Lab 05/18/16 1150  TROPONINI 0.03   ------------------------------------------------------------------------------------------------------------------  RADIOLOGY:  No results found.  ASSESSMENT AND PLAN:   Principal Problem:   Jaundice  * Jaundice  as wife said his stools turning light color.  Negative for acute hepatitis panel,   GI to help with further management. Sent multiple tests for autoimmune disorders, still waiting for the result.  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   Likely due to medication.    RUQ sono and MRCP are without any clear abnormalities.   Monitor LFTs for improvement, serial INR to follow.   Upgrade to soft diet  today.  * Lactic acidosis and sepsis due to pneumonia  Patient is immunosuppressed, so he  is low white blood cell count is not reliable as a signal of infection.  He has tachycardia, tachypnea and hypoxia with finding of pneumonia on chest x-ray and high lactic acid.  On vanc+ cefepime.   MRSA screen is negative- so stop vanc.  * Diabetes  We will hold oral medication as patient would be in liquid diet and keep on insulin sliding scale coverage.  * Hypertension  Hold medication because of presence with the lactic acidosis and sepsis.   BP stable, lactic acidosis resolved.  * Lung fibrosis and chronic respiratory failure  Continue oxygen supplementation, and treat for pneumonia.   As per Rheumatology- increased prednisone to 20 mg daily.   His blood sugar is fluctuating more with 20 mg, I will give her 10 mg 2 times a day   All the records are reviewed and case discussed with Care Management/Social Workerr. Management plans discussed with the patient, family and they are in agreement.  CODE STATUS: Full  TOTAL TIME TAKING CARE OF THIS PATIENT: 35 minutes.    POSSIBLE D/C IN 2-3 DAYS, DEPENDING ON CLINICAL CONDITION. Spoke to his wife and 2 daughters in room today.  Altamese Dilling M.D on 05/21/2016   Between 7am to 6pm - Pager - [phone]  After 6pm go to [email or web address] - password [email or web address]  [facility] [place] [facility]  Office  [phone]  CC: Primary care physician; Dione Housekeeper, MD  Note: This dictation was prepared with Dragon dictation along with smaller phrase technology. Any transcriptional errors that result from this process are unintentional.

## 2016-05-21 NOTE — Progress Notes (Signed)
  GI Inpatient Follow-up Note  Patient Identification: Fletcher AnonGeorge Evon Roher is a 75 y.o. male with   Subjective: Alert. No complaints. AST/ALT improving. Tbili increasing.  Scheduled Inpatient Medications:  . ceFEPime (MAXIPIME) IV  1 g Intravenous Q12H  . insulin aspart  0-9 Units Subcutaneous TID WC  . [START ON 05/22/2016] predniSONE  10 mg Oral BID WC     Physical Examination: Blood pressure 116/68, pulse 67, temperature 98.1 F (36.7 C), temperature source Oral, resp. rate 18, height 5\' 10"  (1.778 m), weight 81.602 kg (179 lb 14.4 oz), SpO2 98 %.  Gen: NAD, alert and oriented x 4 HEENT: PEERLA, EOMI, icteric Neck: supple, no JVD or thyromegaly Chest: CTA bilaterally, no wheezes, crackles, or rales CV: RRR, no m/g/c/r Abd: soft, NT, Distended.  +BS in all four quadrants; no guarding, ridigity, or rebound tenderness Skin: mild jaundice  Data: CBC Latest Ref Rng 05/19/2016 05/18/2016  WBC 3.8 - 10.6 K/uL 2.7(L) 2.9(L)  Hemoglobin 13.0 - 18.0 g/dL 7.2(L) 9.2(L)  Hematocrit 40.0 - 52.0 % 21.4(L) 27.7(L)  Platelets 150 - 440 K/uL 196 274    CMP Latest Ref Rng 05/21/2016 05/20/2016 05/19/2016  Glucose 65 - 99 mg/dL 161(W165(H) 960(A151(H) 94  BUN 6 - 20 mg/dL 15 16 54(U21(H)  Creatinine 0.61 - 1.24 mg/dL 9.81(X0.58(L) 9.14(N0.50(L) 8.290.63  Sodium 135 - 145 mmol/L 137 137 136  Potassium 3.5 - 5.1 mmol/L 3.3(L) 3.7 3.9  Chloride 101 - 111 mmol/L 101 102 102  CO2 22 - 32 mmol/L 31 30 23   Calcium 8.9 - 10.3 mg/dL 8.1(L) 8.2(L) 8.2(L)  Total Protein 6.5 - 8.1 g/dL 5.6(O4.4(L) 4.6(L) 4.7(L)  Total Bilirubin 0.3 - 1.2 mg/dL 9.2(H) 8.8(H) 7.3(H)  Alkaline Phos 38 - 126 U/L 110 110 112  AST 15 - 41 U/L 98(H) 100(H) 110(H)  ALT 17 - 63 U/L 98(H) 98(H) 100(H)   Assessment/Plan: Fletcher AnonGeorge Evon Muller is a 75 y.o. male with jaundice and abnormal LFTs. His AST/ALT appear to be downtrending with rising Tbili. I suspect that his AST/ALT peaked and are now appropriately downtrending with a significant lag indicated in Tbili.  He  feels better and thus I would hope that the Tbili would begin to fall in the near future. There is a suggestion of cirrhosis on his US and ascites on MRCP, which may indicate a more chronic liver process with the acute injury. There is also diffuse pancreatic edema with the indication of pancreatitis on MRCP, but clinically the pt does not have presentation consistent with pancreatitis.  Recommendations:  - continue to trend LFTs - continue to monitor for new abdominal pain - avoid hepatotoxic agents  Please call with questions or concerns.  Rishika Mccollom L. Zachery DauerBarnes, MD, MPH

## 2016-05-22 LAB — COMPREHENSIVE METABOLIC PANEL
ALBUMIN: 1.8 g/dL — AB (ref 3.5–5.0)
ALK PHOS: 113 U/L (ref 38–126)
ALT: 107 U/L — ABNORMAL HIGH (ref 17–63)
AST: 94 U/L — AB (ref 15–41)
Anion gap: 4 — ABNORMAL LOW (ref 5–15)
BILIRUBIN TOTAL: 8.7 mg/dL — AB (ref 0.3–1.2)
BUN: 21 mg/dL — AB (ref 6–20)
CALCIUM: 8 mg/dL — AB (ref 8.9–10.3)
CO2: 30 mmol/L (ref 22–32)
CREATININE: 0.63 mg/dL (ref 0.61–1.24)
Chloride: 104 mmol/L (ref 101–111)
GFR calc Af Amer: >60 mL/min (ref >60)
GLUCOSE: 168 mg/dL — AB (ref 65–99)
Potassium: 3.5 mmol/L (ref 3.5–5.1)
Sodium: 138 mmol/L (ref 135–145)
TOTAL PROTEIN: 4.5 g/dL — AB (ref 6.5–8.1)

## 2016-05-22 LAB — GLUCOSE, CAPILLARY
GLUCOSE-CAPILLARY: 177 mg/dL — AB (ref 65–99)
GLUCOSE-CAPILLARY: 222 mg/dL — AB (ref 65–99)
Glucose-Capillary: 315 mg/dL — ABNORMAL HIGH (ref 65–99)
Glucose-Capillary: 262 mg/dL — ABNORMAL HIGH (ref 65–99)

## 2016-05-22 LAB — OCCULT BLOOD X 1 CARD TO LAB, STOOL: Fecal Occult Bld: POSITIVE — AB

## 2016-05-22 MED ORDER — DEXTROSE 5 % IV SOLN
1.0000 g | Freq: Three times a day (TID) | INTRAVENOUS | Status: DC
  Administered 2016-05-22 – 2016-05-24 (×5): 1 g via INTRAVENOUS
  Filled 2016-05-22 (×7): qty 1

## 2016-05-22 NOTE — Progress Notes (Signed)
[facility] - [place] at [facility]   PATIENT NAME: Dionicia AblerGeorge Crookston    MR#:  [id number]  DATE OF BIRTH:  Dec 20, 1941  SUBJECTIVE:  CHIEF COMPLAINT:   Chief Complaint  Patient presents with  . Jaundice     No pain,   No complains.    Tolerating soft diet.   generalized weakness and not able to walk, but he said for many months he is not walking.   Liver enzymes are coming down and bilirubin is finally somewhat down today.  REVIEW OF SYSTEMS:  CONSTITUTIONAL: No fever,positive for fatigue or weakness.  EYES: No blurred or double vision.  EARS, NOSE, AND THROAT: No tinnitus or ear pain.  RESPIRATORY: No cough, shortness of breath, wheezing or hemoptysis.  CARDIOVASCULAR: No chest pain, orthopnea, edema.  GASTROINTESTINAL: No nausea, vomiting, diarrhea or abdominal pain.  GENITOURINARY: No dysuria, hematuria.  ENDOCRINE: No polyuria, nocturia,  HEMATOLOGY: No anemia, easy bruising or bleeding SKIN: No rash or lesion. MUSCULOSKELETAL: No joint pain or arthritis.   NEUROLOGIC: No tingling, numbness, weakness.  PSYCHIATRY: No anxiety or depression.   ROS  DRUG ALLERGIES:   Allergies  Allergen Reactions  . Atorvastatin     Other reaction(s): Muscle Pain  . Sulfa Antibiotics Diarrhea  . Sulfamethoxazole-Trimethoprim Diarrhea    VITALS:  Blood pressure 117/64, pulse 74, temperature 98.3 F (36.8 C), temperature source Oral, resp. rate 17, height 5\' 10"  (1.778 m), weight 81.602 kg (179 lb 14.4 oz), SpO2 100 %.  PHYSICAL EXAMINATION:   GENERAL: 75 y.o.-year-old patient lying in the bed with no acute distress.  EYES: Pupils equal, round, reactive to light and accommodation.Present scleral icterus. Extraocular muscles intact.  HEENT: Head atraumatic, normocephalic. Oropharynx and nasopharynx clear.  NECK: Supple, no jugular venous distention. No thyroid enlargement, no tenderness.  LUNGS: Normal breath sounds bilaterally, no wheezing,some crepitation. No  use of accessory muscles of respiration. On supplemental oxygen via nasal cannula.  CARDIOVASCULAR: S1, S2 normal. No murmurs, rubs, or gallops.  ABDOMEN: Soft, nontender, nondistended. Bowel sounds present. No organomegaly or mass.  EXTREMITIES: No pedal edema, cyanosis, or clubbing.  NEUROLOGIC: Cranial nerves II through XII are intact. Muscle strength 4/5 in all extremities. Sensation intact. Gait not checked.  PSYCHIATRIC: The patient is alert and oriented x 3.  SKIN: No obvious rash, lesion, or ulcer. Yellowish discoloration of his skin and sclera.  Physical Exam LABORATORY PANEL:   CBC  Recent Labs Lab 05/19/16 0341  WBC 2.7*  HGB 7.2*  HCT 21.4*  PLT 196   ------------------------------------------------------------------------------------------------------------------  Chemistries   Recent Labs Lab 05/22/16 0554  NA 138  K 3.5  CL 104  CO2 30  GLUCOSE 168*  BUN 21*  CREATININE 0.63  CALCIUM 8.0*  AST 94*  ALT 107*  ALKPHOS 113  BILITOT 8.7*   ------------------------------------------------------------------------------------------------------------------  Cardiac Enzymes  Recent Labs Lab 05/18/16 1150  TROPONINI 0.03   ------------------------------------------------------------------------------------------------------------------  RADIOLOGY:  No results found.  ASSESSMENT AND PLAN:   Principal Problem:   Jaundice  * Jaundice  as wife said his stools turning light color.  Negative for acute hepatitis panel,   GI  help with further management. Sent multiple tests for autoimmune disorders, still waiting for the result.  He has polymyositis and he was on azathioprine for that.   Stopped now.   RUQ sono and MRCP are without any clear abnormalities.   Monitor LFTs for improvement, serial INR to follow.   Upgrade to soft diet .  * Lactic acidosis  and sepsis due to pneumonia  Patient is immunosuppressed, so he is low white blood  cell count is not reliable as a signal of infection.  He has tachycardia, tachypnea and hypoxia with finding of pneumonia on chest x-ray and high lactic acid.  IV cefepime.total 7 days of Abx,due to immune suppressed status.   MRSA screen is negative- so stop vanc.  * Diabetes  We will hold oral medication as patient would be in liquid diet and keep on insulin sliding scale coverage.  * Hypertension  Hold medication because of presence with the lactic acidosis and sepsis.   BP stable, lactic acidosis resolved.  * Lung fibrosis and chronic respiratory failure  Continue oxygen supplementation, and treat for pneumonia.   As per Rheumatology- increased prednisone to 20 mg daily.   His blood sugar is fluctuating more with 20 mg, I will give her 10 mg 2 times a day   All the records are reviewed and case discussed with Care Management/Social Workerr. Management plans discussed with the patient, family and they are in agreement.  CODE STATUS: Full  TOTAL TIME TAKING CARE OF THIS PATIENT: 35 minutes.    POSSIBLE D/C IN 2-3 DAYS, DEPENDING ON CLINICAL CONDITION. Spoke to his wife in room today.  Altamese Dilling M.D on 05/22/2016   Between 7am to 6pm - Pager - [phone]  After 6pm go to [email or web address] - password [email or web address]  [facility] [place] [facility]  Office  [phone]  CC: Primary care physician; Dione Housekeeper, MD  Note: This dictation was prepared with Dragon dictation along with smaller phrase technology. Any transcriptional errors that result from this process are unintentional.

## 2016-05-22 NOTE — Consult Note (Signed)
GI Inpatient Follow-up Note  Patient Identification: Lee Johnson is a 75 y.o. 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  Subjective: Patient denies complaints today.  Per wife, jaundice improving.  Scheduled Inpatient Medications:  . ceFEPime (MAXIPIME) IV  1 g Intravenous Q12H  . insulin aspart  0-9 Units Subcutaneous TID WC  . predniSONE  10 mg Oral BID WC    Continuous Inpatient Infusions:   . sodium chloride 75 mL/hr at 05/21/16 1639    PRN Inpatient Medications:    Review of Systems: Constitutional: Weight is stable.  Eyes: No changes in vision. ENT: No oral lesions, sore throat.  GI: see HPI.  Heme/Lymph: No easy bruising.  CV: No chest pain.  GU: No hematuria.  Integumentary: No rashes.  Neuro: No headaches.  Psych: No depression/anxiety.  Endocrine: No heat/cold intolerance.  Allergic/Immunologic: No urticaria.  Resp: No cough, SOB.  Musculoskeletal: No joint swelling.    Physical Examination: BP 115/59 mmHg  Pulse 65  Temp(Src) 98 F (36.7 C) (Oral)  Resp 20  Ht 5' 10"  (1.778 m)  Wt 81.602 kg (179 lb 14.4 oz)  BMI 25.81 kg/m2  SpO2 100% Gen: NAD, alert and oriented x 4 HEENT: PEERLA, EOMI, + icteric Neck: supple, no JVD or thyromegaly Chest: CTA bilaterally, no wheezes, crackles, or other adventitious sounds CV: RRR, no m/g/c/r Abd: soft, NT, ND, +BS in all four quadrants; no HSM, guarding, ridigity, or rebound tenderness Ext: no edema, well perfused with 2+ pulses, Skin: no rash or lesions noted; + mild jaundice Lymph: no LAD  Data: Lab Results  Component Value Date   WBC 2.7* 05/19/2016   HGB 7.2* 05/19/2016   HCT 21.4* 05/19/2016   MCV 109.5* 05/19/2016   PLT 196 05/19/2016    Recent Labs Lab 05/18/16 1150 05/19/16 0341  HGB 9.2* 7.2*   Lab Results  Component Value Date   NA 138 05/22/2016   K 3.5 05/22/2016   CL 104 05/22/2016   CO2 30  05/22/2016   BUN 21* 05/22/2016   CREATININE 0.63 05/22/2016   Lab Results  Component Value Date   ALT 107* 05/22/2016   AST 94* 05/22/2016   ALKPHOS 113 05/22/2016   BILITOT 8.7* 05/22/2016    Recent Labs Lab 05/18/16 1150 05/21/16 0529  APTT 39*  --   INR 1.29 1.26   Assessment/Plan: Mr. Casanova is a 75 y.o. 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  AST, ALT, and alk phos remain stable, T bili trending down today.  US demonstrated an echogenic focus w/in the gall bladder as well as a nodular liver contour.  Hepatic serologic work-up resulted at this time is negative except for elevated ferritin, likely due to acute phase reaction.  MRCP showed a collapsed gall bladder with diffuse pancreatic edema; however, no clinical signs or symptoms of pancreatitis are present. Patient continues to feel well and deny new complaints at this time.  Likely cholestatic injury due to recent increase in Imuran.  Continue to trend LFTs  Recommendations: - Continue trending LFTs and INR - Recommend outpatient follow-up at [facility] for further evaluation of possible cirrhosis  Patient has been discussed with Dr. Vira Agar, pending further GI recommendations at this time. Please call with questions or concerns.  Lavera Guise, PA-C [facility] Phone: [phone] Pager: [phone]

## 2016-05-22 NOTE — Final Progress Note (Signed)
Patient is a high fall risk. Patient and wife was educated about safety precaution such bed/chair alarm and to call nursing staff  if patient needed to get out of bed for any reason and bathroom needs. Wife stated , she has to assist the patient right away for bathroom needs otherwise he will make a mess and that  patient can not wait for the nursing staff . She also stated that she knows the rule but she has been doing this at home as well.

## 2016-05-22 NOTE — Care Management Important Message (Signed)
Important Message  Patient Details  Name: Lee Johnson MRN: [id number] Date of Birth: 1941/09/15   Medicare Important Message Given:  Yes    Olegario MessierKathy A Taquita Demby 05/22/2016, 1:53 PM

## 2016-05-22 NOTE — Progress Notes (Signed)
ANTIBIOTIC CONSULT NOTE - INITIAL  Pharmacy Consult for renal adjust medications Indication: pneumonia  Allergies  Allergen Reactions  . Atorvastatin     Other reaction(s): Muscle Pain  . Sulfa Antibiotics Diarrhea  . Sulfamethoxazole-Trimethoprim Diarrhea    Patient Measurements: Height: 5\' 10"  (177.8 cm) Weight: 179 lb 14.4 oz (81.602 kg) IBW/kg (Calculated) : 73   Vital Signs: Temp: 98.4 F (36.9 C) (05/29 1314) Temp Source: Oral (05/29 1314) BP: 104/68 mmHg (05/29 1314) Pulse Rate: 70 (05/29 1314) Intake/Output from previous day: 05/28 0701 - 05/29 0700 In: 2108 [P.O.:480; I.V.:1628] Out: 900 [Urine:900] Intake/Output from this shift: Total I/O In: 492 [I.V.:492] Out: 175 [Urine:175]  Labs:  Recent Labs  05/20/16 0603 05/21/16 0529 05/22/16 0554  CREATININE 0.50* 0.58* 0.63   Estimated Creatinine Clearance: 83.6 mL/min (by C-G formula based on Cr of 0.63). No results for input(s): VANCOTROUGH, VANCOPEAK, VANCORANDOM, GENTTROUGH, GENTPEAK, GENTRANDOM, TOBRATROUGH, TOBRAPEAK, TOBRARND, AMIKACINPEAK, AMIKACINTROU, AMIKACIN in the last 72 hours.   Microbiology: Recent Results (from the past 720 hour(s))  Blood culture (routine x 2)     Status: None (Preliminary result)   Collection Time: 05/18/16  1:00 PM  Result Value Ref Range Status   Specimen Description BLOOD LEFT ASSIST CONTROL  Final   Special Requests BOTTLES DRAWN AEROBIC AND ANAEROBIC 2CCAERO,2CCANA  Final   Culture NO GROWTH 4 DAYS  Final   Report Status PENDING  Incomplete  Blood culture (routine x 2)     Status: None (Preliminary result)   Collection Time: 05/18/16  1:00 PM  Result Value Ref Range Status   Specimen Description BLOOD RIGHT ASSIST CONTROL  Final   Special Requests BOTTLES DRAWN AEROBIC AND ANAEROBIC 2CCAERO,2CCANA  Final   Culture NO GROWTH 4 DAYS  Final   Report Status PENDING  Incomplete  Urine culture     Status: None   Collection Time: 05/18/16  1:42 PM  Result Value Ref  Range Status   Specimen Description URINE, RANDOM  Final   Special Requests NONE  Final   Culture NO GROWTH [facility]   Final   Report Status 05/19/2016 FINAL  Final  MRSA PCR Screening     Status: None   Collection Time: 05/19/16  3:20 PM  Result Value Ref Range Status   MRSA by PCR NEGATIVE NEGATIVE Final    Comment:        The GeneXpert MRSA Assay (FDA approved for NASAL specimens only), is one component of a comprehensive MRSA colonization surveillance program. It is not intended to diagnose MRSA infection nor to guide or monitor treatment for MRSA infections.     Medical History: Past Medical History  Diagnosis Date  . Polymyositis (HCC)   . Pulmonary fibrosis (HCC)   . Diabetes mellitus without complication (HCC)   . Hypertension     Medications:  Scheduled:  . ceFEPime (MAXIPIME) IV  1 g Intravenous Q12H  . insulin aspart  0-9 Units Subcutaneous TID WC  . predniSONE  10 mg Oral BID WC   Infusions:  . sodium chloride 75 mL/hr at 05/22/16 1233   Assessment: Patient on cefepime for HCAP  Goal of Therapy:  Vancomycin trough level 15-20 mcg/ml  Plan:  Current orders for cefepime 1gm IV Q12H. Will adjust to 1gm IV Q8H based on renal functio and indication  Garlon HatchetJody Devra Stare, PharmD Clinical Pharmacist  05/22/2016

## 2016-05-22 NOTE — Care Management (Signed)
Reached out [facility] to determine if patient would meet agency's criteria for home health.  If not, will make a referral to [facility].

## 2016-05-23 DIAGNOSIS — B179 Acute viral hepatitis, unspecified: Secondary | ICD-10-CM | POA: Insufficient documentation

## 2016-05-23 DIAGNOSIS — K7200 Acute and subacute hepatic failure without coma: Secondary | ICD-10-CM

## 2016-05-23 LAB — COMPREHENSIVE METABOLIC PANEL
ALK PHOS: 122 U/L (ref 38–126)
ALT: 111 U/L — AB (ref 17–63)
AST: 87 U/L — ABNORMAL HIGH (ref 15–41)
Albumin: 1.8 g/dL — ABNORMAL LOW (ref 3.5–5.0)
Anion gap: 4 — ABNORMAL LOW (ref 5–15)
BILIRUBIN TOTAL: 8.5 mg/dL — AB (ref 0.3–1.2)
BUN: 26 mg/dL — ABNORMAL HIGH (ref 6–20)
CALCIUM: 7.9 mg/dL — AB (ref 8.9–10.3)
CO2: 27 mmol/L (ref 22–32)
CREATININE: 0.64 mg/dL (ref 0.61–1.24)
Chloride: 108 mmol/L (ref 101–111)
Glucose, Bld: 182 mg/dL — ABNORMAL HIGH (ref 65–99)
Potassium: 3.7 mmol/L (ref 3.5–5.1)
SODIUM: 139 mmol/L (ref 135–145)
Total Protein: 4.6 g/dL — ABNORMAL LOW (ref 6.5–8.1)

## 2016-05-23 LAB — LACTATE DEHYDROGENASE: LDH: 231 U/L — AB (ref 98–192)

## 2016-05-23 LAB — CBC
HEMATOCRIT: 16.6 % — AB (ref 40.0–52.0)
Hemoglobin: 5.8 g/dL — ABNORMAL LOW (ref 13.0–18.0)
MCH: 37.6 pg — AB (ref 26.0–34.0)
MCHC: 34.9 g/dL (ref 32.0–36.0)
MCV: 107.7 fL — AB (ref 80.0–100.0)
Platelets: 121 10*3/uL — ABNORMAL LOW (ref 150–440)
RBC: 1.54 MIL/uL — AB (ref 4.40–5.90)
RDW: 23.6 % — ABNORMAL HIGH (ref 11.5–14.5)
WBC: 2.2 10*3/uL — AB (ref 3.8–10.6)

## 2016-05-23 LAB — CULTURE, BLOOD (ROUTINE X 2)
CULTURE: NO GROWTH
Culture: NO GROWTH

## 2016-05-23 LAB — HEMOGLOBIN: Hemoglobin: 6.1 g/dL — ABNORMAL LOW (ref 13.0–18.0)

## 2016-05-23 LAB — CERULOPLASMIN: CERULOPLASMIN: 19.2 mg/dL (ref 16.0–31.0)

## 2016-05-23 LAB — HEMOGLOBIN AND HEMATOCRIT, BLOOD
HEMATOCRIT: 25.5 % — AB (ref 40.0–52.0)
HEMOGLOBIN: 8.8 g/dL — AB (ref 13.0–18.0)

## 2016-05-23 LAB — BILIRUBIN, DIRECT: BILIRUBIN DIRECT: 5.3 mg/dL — AB (ref 0.1–0.5)

## 2016-05-23 LAB — ABO/RH: ABO/RH(D): A POS

## 2016-05-23 LAB — GLUCOSE, CAPILLARY
GLUCOSE-CAPILLARY: 184 mg/dL — AB (ref 65–99)
GLUCOSE-CAPILLARY: 301 mg/dL — AB (ref 65–99)
GLUCOSE-CAPILLARY: 314 mg/dL — AB (ref 65–99)
Glucose-Capillary: 189 mg/dL — ABNORMAL HIGH (ref 65–99)

## 2016-05-23 LAB — ANTINUCLEAR ANTIBODIES, IFA: ANA Ab, IFA: NEGATIVE

## 2016-05-23 LAB — PREPARE RBC (CROSSMATCH)

## 2016-05-23 LAB — MITOCHONDRIAL ANTIBODIES: Mitochondrial M2 Ab, IgG: 5.1 Units (ref 0.0–20.0)

## 2016-05-23 LAB — ANTI-SMOOTH MUSCLE ANTIBODY, IGG: F-Actin IgG: 17 Units (ref 0–19)

## 2016-05-23 MED ORDER — SODIUM CHLORIDE 0.9 % IV SOLN
Freq: Once | INTRAVENOUS | Status: AC
  Administered 2016-05-23: 14:00:00 via INTRAVENOUS

## 2016-05-23 NOTE — Progress Notes (Signed)
Dr. Tobi BastosPyreddy notified of Hgb of 5.8 and WBC 2.2; acknowledged; new orders written.Windy Carinaurner,Amol Domanski K, RN 6:43 AM 05/23/2016

## 2016-05-23 NOTE — Progress Notes (Signed)
[facility]  [address] [place], [place] [address] Phone: [phone] Fax : [phone]   Subjective: This patient was admitted with abnormal liver enzymes and was found on imaging to have findings consistent with cirrhosis.  The patient's liver enzymes have fluctuated but appeared to be coming down now.  The patient's bilirubin has lags slowly behind the liver enzymes.  This morning the patient was found to have anemia without any sign of GI bleeding. The patient reports that he has pale stools as before without any visible black or bloody stools. The stools were found to be heme positive   Objective: Vital signs in last 24 hours: Filed Vitals:   05/23/16 1357 05/23/16 1426 05/23/16 1736 05/23/16 1800  BP: 124/67 135/61 134/67 129/62  Pulse: 79 74 67 66  Temp: 98.1 F (36.7 C) 98.5 F (36.9 C) 98.4 F (36.9 C) 98.5 F (36.9 C)  TempSrc: Oral Oral Oral Oral  Resp:  Height:      Weight:      SpO2: 99% 100% 100% 100%   Weight change:   Intake/Output Summary (Last 24 hours) at 05/23/16 1844 Last data filed at 05/23/16 1837  Gross per 24 hour  Intake 2692.2 ml  Output    625 ml  Net 2067.2 ml     Exam: Heart:: Regular rate and rhythm Lungs: clear to auscultation Abdomen: soft, nontender, normal bowel sounds   Lab Results: @ Micro Results: Recent Results (from the past 240 hour(s))  Blood culture (routine x 2)     Status: None   Collection Time: 05/18/16  1:00 PM  Result Value Ref Range Status   Specimen Description BLOOD LEFT ASSIST CONTROL  Final   Special Requests BOTTLES DRAWN AEROBIC AND ANAEROBIC 2CCAERO,2CCANA  Final   Culture NO GROWTH 5 DAYS  Final   Report Status 05/23/2016 FINAL  Final  Blood culture (routine x 2)     Status: None   Collection Time: 05/18/16  1:00 PM  Result Value Ref Range Status   Specimen Description BLOOD RIGHT ASSIST CONTROL  Final   Special Requests BOTTLES DRAWN AEROBIC AND  ANAEROBIC 2CCAERO,2CCANA  Final   Culture NO GROWTH 5 DAYS  Final   Report Status 05/23/2016 FINAL  Final  Urine culture     Status: None   Collection Time: 05/18/16  1:42 PM  Result Value Ref Range Status   Specimen Description URINE, RANDOM  Final   Special Requests NONE  Final   Culture NO GROWTH Performed at [facility]   Final   Report Status 05/19/2016 FINAL  Final  MRSA PCR Screening     Status: None   Collection Time: 05/19/16  3:20 PM  Result Value Ref Range Status   MRSA by PCR NEGATIVE NEGATIVE Final    Comment:        The GeneXpert MRSA Assay (FDA approved for NASAL specimens only), is one component of a comprehensive MRSA colonization surveillance program. It is not intended to diagnose MRSA infection nor to guide or monitor treatment for MRSA infections.    Studies/Results: No results found. Medications: I have reviewed the patient's current medications. Scheduled Meds: . ceFEPime (MAXIPIME) IV  1 g Intravenous Q8H  . insulin aspart  0-9 Units Subcutaneous TID WC  . predniSONE  10 mg Oral BID WC   Continuous Infusions: . sodium chloride 75 mL/hr at 05/23/16 0300   PRN Meds:.   Assessment: Principal Problem:   Jaundice  Plan: Abnormal liver enzymes with imaging consistent with cirrhosis. The patient was found to have a drop in his hemoglobin with heme positive stools.  The patient is now being transfused blood.  The amount of the drop in hemoglobin is not consistent with his pale stools. I will continue to monitor his blood count and if it continues to drop he may need to undergo a GI workup.  The patient's workup for his abnormal liver enzymes have shown his alpha-1 antitrypsin to be pending with his AMA being negative, SMA being negative, mitochondrial antibodies being negative and his acute hepatitis panel also being negative.  The pattern appears to be acute on chronic liver injury and appears to be slowly improving.   LOS: 5 days    Midge MiniumDarren Thorsten Climer 05/23/2016, 6:44 PM

## 2016-05-23 NOTE — Progress Notes (Addendum)
Inpatient Diabetes Program Recommendations  AACE/ADA: New Consensus Statement on Inpatient Glycemic Control (2015)  Target Ranges:  Prepandial:   less than 140 mg/dL      Peak postprandial:   less than 180 mg/dL (1-2 hours)      Critically ill patients:  140 - 180 mg/dL   Results for Lee Johnson, Lee Johnson EVON (MRN [id number]) as of 05/23/2016 11:43  Ref. Range 05/22/2016 07:49 05/22/2016 11:20 05/22/2016 16:20 05/22/2016 20:42  Glucose-Capillary Latest Ref Range: 65-99 mg/dL 147177 (H) 829315 (H) 562262 (H) 222 (H)   Results for Lee Johnson, Lee Johnson EVON (MRN [id number]) as of 05/23/2016 11:43  Ref. Range 05/23/2016 07:36 05/23/2016 11:40  Glucose-Capillary Latest Ref Range: 65-99 mg/dL 696184 (H) 295301 (H)    Admit with: Jaundice/ Sepsis/ Pneumonia  History: DM, Pulmonary Fibrosis  Home DM Meds: Glipizide 10 mg bid       Metformin 1000 mg bid  Current Insulin Orders: Novolog Sensitive Correction Scale/ SSI (0-9 units) TID AC      -Note patient receiving Prednisone 10 mg bid.  -Eating 100% of meals and having issues with elevated fasting and postprandial glucose levels.     MD- Please consider the following in-hospital insulin adjustments:  1. Start Levemir 8 units daily (0.1 units/kg dosing)  2. Start Novolog Meal Coverage- Novolog 4 units tid with meals (hold if pt eats <50% of meal) (Use Glycemic Control Order set)     --Will follow patient during hospitalization--  Ambrose FinlandJeannine Johnston Irvin Lizama RN, MSN, CDE Diabetes Coordinator Inpatient Glycemic Control Team Team Pager: [phone] (8a-5p)

## 2016-05-23 NOTE — Progress Notes (Signed)
[facility] -  at [facility]   PATIENT NAME: Lee AblerGeorge Johnson    MR#:  [id number]  DATE OF BIRTH:  1941/06/13  SUBJECTIVE:  CHIEF COMPLAINT:   Chief Complaint  Patient presents with  . Jaundice     No pain,   No complains.    Tolerating soft diet.   generalized weakness and not able to walk, but he said for many months he is not walking.   Liver enzymes and bilirubin coming down very slowly.   Hb dropped today <6. Platelets also dropped.   Stool positive for guiac.  REVIEW OF SYSTEMS:  CONSTITUTIONAL: No fever,positive for fatigue or weakness.  EYES: No blurred or double vision.  EARS, NOSE, AND THROAT: No tinnitus or ear pain.  RESPIRATORY: No cough, shortness of breath, wheezing or hemoptysis.  CARDIOVASCULAR: No chest pain, orthopnea, edema.  GASTROINTESTINAL: No nausea, vomiting, diarrhea or abdominal pain.  GENITOURINARY: No dysuria, hematuria.  ENDOCRINE: No polyuria, nocturia,  HEMATOLOGY: No anemia, easy bruising or bleeding SKIN: No rash or lesion. MUSCULOSKELETAL: No joint pain or arthritis.   NEUROLOGIC: No tingling, numbness, weakness.  PSYCHIATRY: No anxiety or depression.   ROS  DRUG ALLERGIES:   Allergies  Allergen Reactions  . Atorvastatin     Other reaction(s): Muscle Pain  . Sulfa Antibiotics Diarrhea  . Sulfamethoxazole-Trimethoprim Diarrhea    VITALS:  Blood pressure 142/68, pulse 67, temperature 98.7 F (37.1 C), temperature source Oral, resp. rate 16, height 5\' 10"  (1.778 m), weight 81.602 kg (179 lb 14.4 oz), SpO2 100 %.  PHYSICAL EXAMINATION:   GENERAL: 75 y.o.-year-old patient lying in the bed with no acute distress.  EYES: Pupils equal, round, reactive to light and accommodation.Present scleral icterus. Extraocular muscles intact. conjunctiva pale. HEENT: Head atraumatic, normocephalic. Oropharynx and nasopharynx clear.  NECK: Supple, no jugular venous distention. No thyroid enlargement, no tenderness.   LUNGS: Normal breath sounds bilaterally, no wheezing,some crepitation. No use of accessory muscles of respiration. On supplemental oxygen via nasal cannula.  CARDIOVASCULAR: S1, S2 normal. No murmurs, rubs, or gallops.  ABDOMEN: Soft, nontender, nondistended. Bowel sounds present. No organomegaly or mass.  EXTREMITIES: No pedal edema, cyanosis, or clubbing.  NEUROLOGIC: Cranial nerves II through XII are intact. Muscle strength 4/5 in all extremities. Sensation intact. Gait not checked.  PSYCHIATRIC: The patient is alert and oriented x 3.  SKIN: No obvious rash, lesion, or ulcer. Yellowish discoloration of his skin and sclera.  Physical Exam LABORATORY PANEL:   CBC  Recent Labs Lab 05/23/16 0410 05/23/16 0809  WBC 2.2*  --   HGB 5.8* 6.1*  HCT 16.6*  --   PLT 121*  --    ------------------------------------------------------------------------------------------------------------------  Chemistries   Recent Labs Lab 05/23/16 0410  NA 139  K 3.7  CL 108  CO2 27  GLUCOSE 182*  BUN 26*  CREATININE 0.64  CALCIUM 7.9*  AST 87*  ALT 111*  ALKPHOS 122  BILITOT 8.5*   ------------------------------------------------------------------------------------------------------------------  Cardiac Enzymes  Recent Labs Lab 05/18/16 1150  TROPONINI 0.03   ------------------------------------------------------------------------------------------------------------------  RADIOLOGY:  No results found.  ASSESSMENT AND PLAN:   Principal Problem:   Jaundice Active Problems:   Acute hepatitis  * Jaundice  Negative for acute hepatitis panel,   GI  help with further management. Sent multiple tests for autoimmune disorders,       still waiting for the result.  He has polymyositis and he was on azathioprine for that.   Stopped now.   RUQ sono and  MRCP are without any clear abnormalities.   Monitor LFTs for improvement, serial INR to follow.   Upgrade to soft diet  .    As very little and slowimprovement in LFT,and rise in direct Hb, plus drop in cell count- may be other auto immune disease or hemolysis is going on. Check LDH and called hematology.  *pancytopenia   Gradual drop in all 3 cell lines.   LDH high,may be hemolysis? Autoimmune?   Hematology consult. * acute on ch anemia due to possible GI blood loss   Guiac positive,    Hemolysis also possible-LDH high    Hb <6  Today.    discussed with pt about possible side effects of blood transfusion- including more common like low grade fever to rare and serious like renal and lung involvement and infections.  He understood- and due to necessity of transfusion- agreed to receive the transfusion.  hematology consult.  * Lactic acidosis and sepsis due to pneumonia  Patient is immunosuppressed, so he is low white blood cell count is not reliable as a signal of infection.  He has tachycardia, tachypnea and hypoxia with finding of pneumonia on chest x-ray and high lactic acid.  IV cefepime.total 7 days of Abx,due to immune suppressed status.   MRSA screen is negative- so stop vanc.  * Diabetes  We will hold oral medication as patient would be in liquid diet and keep on insulin sliding scale coverage.  * Hypertension  Hold medication because of presence with the lactic acidosis and sepsis.   BP stable, lactic acidosis resolved.  * Lung fibrosis and chronic respiratory failure  Continue oxygen supplementation, and treat for pneumonia.   As per Rheumatology- increased prednisone to 20 mg daily.   His blood sugar is fluctuating more with 20 mg, I will give her 10 mg 2 times a day   All the records are reviewed and case discussed with Care Management/Social Workerr. Management plans discussed with the patient, family and they are in agreement.  CODE STATUS: Full  TOTAL TIME TAKING CARE OF THIS PATIENT: 45 critical care minutes.    POSSIBLE D/C IN 2-3 DAYS, DEPENDING ON CLINICAL  CONDITION. Spoke to his wife in room today. Explained in detail about need of Blood transfusion,and possible side effects.  Altamese Dilling M.D on 05/23/2016   Between 7am to 6pm - Pager - [phone]  After 6pm go to [email or web address] - password [email or web address]  [facility]  Office  [phone]  CC: Primary care physician; Dione Housekeeper, MD  Note: This dictation was prepared with Dragon dictation along with smaller phrase technology. Any transcriptional errors that result from this process are unintentional.

## 2016-05-23 NOTE — Progress Notes (Signed)
Dr. Servando SnareWohl called to discuss drop in hgb, direct bilirubin added to am labs; Windy Carinaurner,Nayelli Inglis K, RN; 7:02 AM 05/23/2016

## 2016-05-23 NOTE — Progress Notes (Signed)
Dr. Servando SnareWohl notified of direct bili of 5.3 and hemocult positive stool; Windy Carinaurner,Riki Berninger K, RN 7:28 AM 05/23/2016

## 2016-05-24 ENCOUNTER — Inpatient Hospital Stay: Payer: Medicare Other

## 2016-05-24 LAB — TYPE AND SCREEN
ABO/RH(D): A POS
Antibody Screen: NEGATIVE
Unit division: 0
Unit division: 0

## 2016-05-24 LAB — COMPREHENSIVE METABOLIC PANEL
ALK PHOS: 152 U/L — AB (ref 38–126)
ALT: 127 U/L — ABNORMAL HIGH (ref 17–63)
ANION GAP: 5 (ref 5–15)
AST: 94 U/L — ABNORMAL HIGH (ref 15–41)
Albumin: 1.9 g/dL — ABNORMAL LOW (ref 3.5–5.0)
BUN: 30 mg/dL — ABNORMAL HIGH (ref 6–20)
CALCIUM: 8.1 mg/dL — AB (ref 8.9–10.3)
CO2: 25 mmol/L (ref 22–32)
Chloride: 109 mmol/L (ref 101–111)
Creatinine, Ser: 0.67 mg/dL (ref 0.61–1.24)
GFR calc non Af Amer: >60 mL/min (ref >60)
Glucose, Bld: 210 mg/dL — ABNORMAL HIGH (ref 65–99)
Potassium: 4.2 mmol/L (ref 3.5–5.1)
SODIUM: 139 mmol/L (ref 135–145)
Total Bilirubin: 9.6 mg/dL — ABNORMAL HIGH (ref 0.3–1.2)
Total Protein: 4.8 g/dL — ABNORMAL LOW (ref 6.5–8.1)

## 2016-05-24 LAB — GLUCOSE, CAPILLARY
GLUCOSE-CAPILLARY: 153 mg/dL — AB (ref 65–99)
GLUCOSE-CAPILLARY: 206 mg/dL — AB (ref 65–99)
GLUCOSE-CAPILLARY: 118 mg/dL — AB (ref 65–99)
Glucose-Capillary: 168 mg/dL — ABNORMAL HIGH (ref 65–99)

## 2016-05-24 LAB — CBC
HCT: 24.9 % — ABNORMAL LOW (ref 40.0–52.0)
HEMOGLOBIN: 8.7 g/dL — AB (ref 13.0–18.0)
MCH: 33.9 pg (ref 26.0–34.0)
MCHC: 34.9 g/dL (ref 32.0–36.0)
MCV: 97.1 fL (ref 80.0–100.0)
PLATELETS: 124 10*3/uL — AB (ref 150–440)
RBC: 2.57 MIL/uL — ABNORMAL LOW (ref 4.40–5.90)
RDW: 24.2 % — AB (ref 11.5–14.5)
WBC: 2.3 10*3/uL — ABNORMAL LOW (ref 3.8–10.6)

## 2016-05-24 LAB — PROTIME-INR
INR: 1.01
PROTHROMBIN TIME: 13.5 s (ref 11.4–15.0)

## 2016-05-24 MED ORDER — PEG 3350-KCL-NA BICARB-NACL 420 G PO SOLR
4000.0000 mL | Freq: Once | ORAL | Status: AC
  Administered 2016-05-24: 4000 mL via ORAL
  Filled 2016-05-24: qty 4000

## 2016-05-24 MED ORDER — CEFUROXIME AXETIL 500 MG PO TABS
500.0000 mg | ORAL_TABLET | Freq: Two times a day (BID) | ORAL | Status: DC
  Administered 2016-05-25 – 2016-05-26 (×3): 500 mg via ORAL
  Filled 2016-05-24 (×4): qty 1

## 2016-05-24 MED ORDER — FENTANYL CITRATE (PF) 100 MCG/2ML IJ SOLN
INTRAMUSCULAR | Status: AC
  Filled 2016-05-24: qty 4

## 2016-05-24 MED ORDER — INSULIN DETEMIR 100 UNIT/ML ~~LOC~~ SOLN
8.0000 [IU] | Freq: Every day | SUBCUTANEOUS | Status: DC
  Administered 2016-05-25 – 2016-05-26 (×2): 8 [IU] via SUBCUTANEOUS
  Filled 2016-05-24 (×4): qty 0.08

## 2016-05-24 MED ORDER — ENSURE ENLIVE PO LIQD
237.0000 mL | Freq: Two times a day (BID) | ORAL | Status: DC
  Administered 2016-05-26 – 2016-05-27 (×3): 237 mL via ORAL

## 2016-05-24 MED ORDER — MIDAZOLAM HCL 5 MG/5ML IJ SOLN
INTRAMUSCULAR | Status: DC
Start: 2016-05-24 — End: 2016-05-24
  Filled 2016-05-24: qty 5

## 2016-05-24 NOTE — Sedation Documentation (Signed)
Procedure cancelled by Dr. Trina AoHoss-hemoglobin low.  Dr. Bonnielee HaffHoss discussed with Dr. Allena KatzPatel.

## 2016-05-24 NOTE — Progress Notes (Signed)
PT Cancellation Note  Patient Details Name: Lee Johnson MRN: [id number] DOB: 1941/07/29   Cancelled Treatment:    Reason Eval/Treat Not Completed: Patient at procedure or test/unavailable. Pt's chart reviewed. Upon PT's arrival, pt is out of room for a liver biopsy and not available. PT will f/u and resume therapy at a later time when appropriate.   [name], PT, DPT  05/24/2016, 11:18 AM [id number]

## 2016-05-24 NOTE — Progress Notes (Signed)
Per Dr. Allena KatzPatel give oral meds after pt returns from procedure.

## 2016-05-24 NOTE — Care Management (Signed)
Spoke with attending to clarify plan.  Liver biopsy was cancelled due to low hemoglobin but it is anticipated that will proceed with EGD/colonsocopy

## 2016-05-24 NOTE — Progress Notes (Signed)
Patient ID: Lee Johnson, male   DOB: 21-Nov-1941, 75 y.o.   MRN: [id number] [facility] - [place] [facility]   PATIENT NAME: Lee Johnson    MR#:  [id number]  DATE OF BIRTH:  01/11/1941  SUBJECTIVE:   Continues with rising bilirubin. No fever, cough or sob REVIEW OF SYSTEMS:   Review of Systems  Constitutional: Positive for malaise/fatigue. Negative for fever, chills and weight loss.  HENT: Negative for ear discharge, ear pain and nosebleeds.   Eyes: Negative for blurred vision, pain and discharge.  Respiratory: Negative for sputum production, shortness of breath, wheezing and stridor.   Cardiovascular: Negative for chest pain, palpitations, orthopnea and PND.  Gastrointestinal: Negative for nausea, vomiting, abdominal pain and diarrhea.  Genitourinary: Negative for urgency and frequency.  Musculoskeletal: Negative for back pain and joint pain.  Neurological: Positive for weakness. Negative for sensory change, speech change and focal weakness.  Psychiatric/Behavioral: Negative for depression and hallucinations. The patient is not nervous/anxious.   All other systems reviewed and are negative.  Tolerating Diet:yes Tolerating PT: pending  DRUG ALLERGIES:   Allergies  Allergen Reactions  . Atorvastatin     Other reaction(s): Muscle Pain  . Sulfa Antibiotics Diarrhea  . Sulfamethoxazole-Trimethoprim Diarrhea    VITALS:  Blood pressure 140/72, pulse 69, temperature 97.9 F (36.6 C), temperature source Oral, resp. rate 19, height  (1.778 m), weight 81.602 kg (179 lb 14.4 oz), SpO2 97 %.  PHYSICAL EXAMINATION:   Physical Exam  GENERAL:  75 y.o.-year-old patient lying in the bed with no acute distress.  EYES: Pupils equal, round, reactive to light and accommodation.++ scleral icterus. Extraocular muscles intact.  HEENT: Head atraumatic, normocephalic. Oropharynx and nasopharynx clear.  NECK:  Supple, no jugular venous distention. No  thyroid enlargement, no tenderness.  LUNGS: Normal breath sounds bilaterally, no wheezing, rales, rhonchi. No use of accessory muscles of respiration.  CARDIOVASCULAR: S1, S2 normal. No murmurs, rubs, or gallops.  ABDOMEN: Soft, nontender, nondistended. Bowel sounds present. No organomegaly or mass.  EXTREMITIES: No cyanosis, clubbing or edema b/l.    NEUROLOGIC: Cranial nerves II through XII are intact. No focal Motor or sensory deficits b/l.   PSYCHIATRIC:  patient is alert and oriented x 3.  SKIN: No obvious rash, lesion, or ulcer. Jaundiced skin  LABORATORY PANEL:  CBC  Recent Labs Lab 05/24/16 0350  WBC 2.3*  HGB 8.7*  HCT 24.9*  PLT 124*    Chemistries   Recent Labs Lab 05/24/16 0350  NA 139  K 4.2  CL 109  CO2 25  GLUCOSE 210*  BUN 30*  CREATININE 0.67  CALCIUM 8.1*  AST 94*  ALT 127*  ALKPHOS 152*  BILITOT 9.6*   Cardiac Enzymes  Recent Labs Lab 05/18/16 1150  TROPONINI 0.03   RADIOLOGY:  No results found. ASSESSMENT AND PLAN:  * Jaundice  Negative for acute hepatitis panel, SMA,AMA negative  GI help with further management.  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  RUQ sono and MRCP are without any clear abnormalities.  Upgrade to soft diet .  pt needs liver biospy but prior to that will need GI w/u for anemai and positve heme occult stools  *pancytopenia  Gradual drop in all 3 cell lines.  LDH high,may be hemolysis? Autoimmune?  Hematology consult.  * acute on ch anemia due to possible GI blood loss  Guiac positive,  Hemolysis also possible-LDH high  Hb <6 Today. S/p 2  unit BT hgb 8.9 -await GI w/u ofor luminal evaluation   * Lactic acidosis and sepsis due to pneumonia  Patient is immunosuppressed, so he is low white blood cell count is not reliable as a signal of infection.  He has tachycardia, tachypnea and hypoxia with finding of pneumonia on chest x-ray and high lactic acid.  IV  cefepime.total 7 days of Abx,due to immune suppressed status.  MRSA screen is negative- so stop vanc. -change to po cefuroxime. No respiratory symptoms  * Diabetes  We will hold oral medication as patient would be in liquid diet and keep on insulin sliding scale coverage.  * Hypertension  BP stable  * Lung fibrosis and chronic respiratory failure  Continue oxygen supplementation, and treat for pneumonia.  As per Rheumatology- increased prednisone to 20 mg daily.   Case discussed with Care Management/Social Worker. Management plans discussed with the patient, family and they are in agreement.  CODE STATUS:full  DVT Prophylaxis: SCD  TOTAL TIME TAKING CARE OF THIS PATIENT: 30 minutes.  >50% time spent on counselling and coordination of care  Spoke with IR Dr Bonnielee Haffhoss and GI dr Servando Snarewohl  Note: This dictation was prepared with Dragon dictation along with smaller phrase technology. Any transcriptional errors that result from this process are unintentional.  Ned Kakar M.D on 05/24/2016 at 5:01 PM  Between 7am to 6pm - Pager - [phone]  After 6pm go to [email or web address] - password [email or web address] [facility]  [facility]  Office  [phone]  CC: Primary care physician; Dione Housekeeperlmedo, Lee Ernesto, MD

## 2016-05-24 NOTE — Progress Notes (Signed)
Nutrition Follow-up  DOCUMENTATION CODES:   Not applicable  INTERVENTION:   -Monitor intake and cater to pt preferences -Recommend Ensure Enlive po BID, each supplement provides 350 kcal and 20 grams of protein   NUTRITION DIAGNOSIS:   Inadequate oral intake related to acute illness as evidenced by per patient/family report.  improving  GOAL:   Patient will meet greater than or equal to 90% of their needs  improving  MONITOR:   Diet advancement, PO intake  REASON FOR ASSESSMENT:   Malnutrition Screening Tool    ASSESSMENT:     Pt with cirrhosis, anemia and heme positive stools.  Medications reviewed: Labs reviewed: BUN 30, creatinine WDL, glucose 210  Pt and wife report that he is eating 100% of meals but does not like the food.  Reports that he drinks ensure at home.  Diet Order:  DIET SOFT Room service appropriate?: Yes; Fluid consistency:: Thin  Skin:  Reviewed, no issues  Last BM:  5/30  Height:   Ht Readings from Last 1 Encounters:  05/18/16 5\' 10"  (1.778 m)    Weight:   Wt Readings from Last 1 Encounters:  05/18/16 179 lb 14.4 oz (81.602 kg)    Ideal Body Weight:     BMI:  Body mass index is 25.81 kg/(m^2).  Estimated Nutritional Needs:   Kcal:  2025-2430 kcals/d  Protein:  101-121 g/d  Fluid:  >/= 2 L/d  EDUCATION NEEDS:   No education needs identified at this time  Charell Faulk B. Freida BusmanAllen, RD, LDN [phone] (pager) Weekend/On-Call pager [phone])

## 2016-05-24 NOTE — Progress Notes (Signed)
Inpatient Diabetes Program Recommendations  AACE/ADA: New Consensus Statement on Inpatient Glycemic Control (2015)  Target Ranges:  Prepandial:   less than 140 mg/dL      Peak postprandial:   less than 180 mg/dL (1-2 hours)      Critically ill patients:  140 - 180 mg/dL   Results for [name], Lee Johnson (MRN [id number]) as of 05/24/2016 09:43  Ref. Range 05/23/2016 07:36 05/23/2016 11:40 05/23/2016 17:00 05/23/2016 21:27  Glucose-Capillary Latest Ref Range: 65-99 mg/dL 782184 (H) 956301 (H) 213314 (H) 189 (H)   Results for [name], Lee Johnson (MRN [id number]) as of 05/24/2016 09:43  Ref. Range 05/24/2016 07:50  Glucose-Capillary Latest Ref Range: 65-99 mg/dL 629168 (H)    Admit with: Jaundice/ Sepsis/ Pneumonia  History: DM, Pulmonary Fibrosis  Home DM Meds: Glipizide 10 mg bid  Metformin 1000 mg bid  Current Insulin Orders: Novolog Sensitive Correction Scale/ SSI (0-9 units) TID AC      -Note patient receiving Prednisone 10 mg bid.  -Eating 100% of meals and having issues with elevated fasting and postprandial glucose levels.     MD- Please consider the following in-hospital insulin adjustments while home PO DM medications are on hold:  1. Start Levemir 8 units daily (0.1 units/kg dosing)  2. Start Novolog Meal Coverage- Novolog 4 units tid with meals (hold if pt eats <50% of meal) (Use Glycemic Control Order set)    --Will follow patient during hospitalization--  Ambrose FinlandJeannine Johnston Anvi Mangal RN, MSN, CDE Diabetes Coordinator Inpatient Glycemic Control Team Team Pager: [phone] (8a-5p)

## 2016-05-24 NOTE — Progress Notes (Signed)
Called Dr. Tobi BastosPyreddy regarding 21 beat run of SVT and HR of 140 per central monitoring.  Doctor will put in cardiology consult in morning.  Arturo MortonClay, Wrenley Sayed N  05/24/2016  4:44 AM

## 2016-05-25 ENCOUNTER — Encounter
Admission: EM | Disposition: A | Discharge status: Home / Self Care | Payer: Self-pay | Source: Home / Self Care | Attending: Internal Medicine

## 2016-05-25 ENCOUNTER — Inpatient Hospital Stay: Payer: Medicare Other | Admitting: Anesthesiology

## 2016-05-25 DIAGNOSIS — R7989 Other specified abnormal findings of blood chemistry: Secondary | ICD-10-CM

## 2016-05-25 DIAGNOSIS — E119 Type 2 diabetes mellitus without complications: Secondary | ICD-10-CM

## 2016-05-25 DIAGNOSIS — R630 Anorexia: Secondary | ICD-10-CM

## 2016-05-25 DIAGNOSIS — D62 Acute posthemorrhagic anemia: Secondary | ICD-10-CM | POA: Insufficient documentation

## 2016-05-25 DIAGNOSIS — K641 Second degree hemorrhoids: Secondary | ICD-10-CM | POA: Insufficient documentation

## 2016-05-25 DIAGNOSIS — K5730 Diverticulosis of large intestine without perforation or abscess without bleeding: Secondary | ICD-10-CM | POA: Insufficient documentation

## 2016-05-25 DIAGNOSIS — M3320 Polymyositis, organ involvement unspecified: Secondary | ICD-10-CM

## 2016-05-25 DIAGNOSIS — R110 Nausea: Secondary | ICD-10-CM

## 2016-05-25 DIAGNOSIS — D649 Anemia, unspecified: Secondary | ICD-10-CM

## 2016-05-25 DIAGNOSIS — R17 Unspecified jaundice: Secondary | ICD-10-CM

## 2016-05-25 DIAGNOSIS — I10 Essential (primary) hypertension: Secondary | ICD-10-CM

## 2016-05-25 DIAGNOSIS — D61818 Other pancytopenia: Secondary | ICD-10-CM

## 2016-05-25 DIAGNOSIS — K2970 Gastritis, unspecified, without bleeding: Secondary | ICD-10-CM

## 2016-05-25 HISTORY — PX: COLONOSCOPY WITH PROPOFOL: SHX5780

## 2016-05-25 HISTORY — PX: ESOPHAGOGASTRODUODENOSCOPY (EGD) WITH PROPOFOL: SHX5813

## 2016-05-25 LAB — COMPREHENSIVE METABOLIC PANEL
ALBUMIN: 1.9 g/dL — AB (ref 3.5–5.0)
ALK PHOS: 173 U/L — AB (ref 38–126)
ALT: 144 U/L — ABNORMAL HIGH (ref 17–63)
ANION GAP: 6 (ref 5–15)
AST: 114 U/L — AB (ref 15–41)
BILIRUBIN TOTAL: 10.4 mg/dL — AB (ref 0.3–1.2)
BUN: 17 mg/dL (ref 6–20)
CALCIUM: 8.1 mg/dL — AB (ref 8.9–10.3)
CO2: 24 mmol/L (ref 22–32)
Chloride: 108 mmol/L (ref 101–111)
Creatinine, Ser: 0.56 mg/dL — ABNORMAL LOW (ref 0.61–1.24)
GFR calc Af Amer: >60 mL/min (ref >60)
GFR calc non Af Amer: >60 mL/min (ref >60)
GLUCOSE: 152 mg/dL — AB (ref 65–99)
POTASSIUM: 3.6 mmol/L (ref 3.5–5.1)
SODIUM: 138 mmol/L (ref 135–145)
Total Protein: 4.6 g/dL — ABNORMAL LOW (ref 6.5–8.1)

## 2016-05-25 LAB — RETICULOCYTES
RBC.: 2.66 MIL/uL — ABNORMAL LOW (ref 4.40–5.90)
RETIC CT PCT: 3.8 % — AB (ref 0.4–3.1)
Retic Count, Absolute: 101.1 10*3/uL (ref 19.0–183.0)

## 2016-05-25 LAB — CBC WITH DIFFERENTIAL/PLATELET
Basophils Absolute: 0 10*3/uL (ref 0–0.1)
Eosinophils Absolute: 0 10*3/uL (ref 0–0.7)
Eosinophils Relative: 2 %
HEMATOCRIT: 26.4 % — AB (ref 40.0–52.0)
HEMOGLOBIN: 9 g/dL — AB (ref 13.0–18.0)
LYMPHS ABS: 1 10*3/uL (ref 1.0–3.6)
MCH: 33.8 pg (ref 26.0–34.0)
MCHC: 34.1 g/dL (ref 32.0–36.0)
MCV: 99 fL (ref 80.0–100.0)
MONO ABS: 0.2 10*3/uL (ref 0.2–1.0)
NEUTROS ABS: 1.3 10*3/uL — AB (ref 1.4–6.5)
Platelets: 142 10*3/uL — ABNORMAL LOW (ref 150–440)
RBC: 2.66 MIL/uL — ABNORMAL LOW (ref 4.40–5.90)
RDW: 26.1 % — AB (ref 11.5–14.5)
WBC: 2.6 10*3/uL — ABNORMAL LOW (ref 3.8–10.6)

## 2016-05-25 LAB — GLUCOSE, CAPILLARY
Glucose-Capillary: 154 mg/dL — ABNORMAL HIGH (ref 65–99)
Glucose-Capillary: 121 mg/dL — ABNORMAL HIGH (ref 65–99)
Glucose-Capillary: 139 mg/dL — ABNORMAL HIGH (ref 65–99)
Glucose-Capillary: 241 mg/dL — ABNORMAL HIGH (ref 65–99)

## 2016-05-25 LAB — LACTATE DEHYDROGENASE: LDH: 272 U/L — ABNORMAL HIGH (ref 98–192)

## 2016-05-25 SURGERY — ESOPHAGOGASTRODUODENOSCOPY (EGD) WITH PROPOFOL
Anesthesia: General

## 2016-05-25 MED ORDER — MIDAZOLAM HCL 5 MG/5ML IJ SOLN
INTRAMUSCULAR | Status: DC | PRN
  Administered 2016-05-25: 1 mg via INTRAVENOUS

## 2016-05-25 MED ORDER — FENTANYL CITRATE (PF) 100 MCG/2ML IJ SOLN
INTRAMUSCULAR | Status: DC | PRN
  Administered 2016-05-25: 50 ug via INTRAVENOUS

## 2016-05-25 MED ORDER — PROPOFOL 500 MG/50ML IV EMUL
INTRAVENOUS | Status: DC | PRN
  Administered 2016-05-25: 160 ug/kg/min via INTRAVENOUS

## 2016-05-25 MED ORDER — PROPOFOL 10 MG/ML IV BOLUS
INTRAVENOUS | Status: DC | PRN
  Administered 2016-05-25: 40 mg via INTRAVENOUS

## 2016-05-25 MED ORDER — TRAMADOL HCL 50 MG PO TABS
50.0000 mg | ORAL_TABLET | Freq: Four times a day (QID) | ORAL | Status: DC | PRN
  Administered 2016-05-25: 50 mg via ORAL
  Filled 2016-05-25: qty 1

## 2016-05-25 MED ORDER — GLYCOPYRROLATE 0.2 MG/ML IJ SOLN
INTRAMUSCULAR | Status: DC | PRN
  Administered 2016-05-25: .15 mg via INTRAVENOUS

## 2016-05-25 NOTE — Anesthesia Preprocedure Evaluation (Signed)
Anesthesia Evaluation  Patient identified by MRN, date of birth, ID band Patient awake    Reviewed: Allergy & Precautions, H&P , NPO status , Patient's Chart, lab work & pertinent test results, reviewed documented beta blocker date and time   Airway Mallampati: II   Neck ROM: full    Dental  (+) Poor Dentition, Teeth Intact   Pulmonary neg pulmonary ROS,    Pulmonary exam normal        Cardiovascular hypertension, negative cardio ROS Normal cardiovascular exam Rate:Normal     Neuro/Psych  Neuromuscular disease negative neurological ROS  negative psych ROS   GI/Hepatic negative GI ROS, Neg liver ROS, (+) Hepatitis -  Endo/Other  negative endocrine ROSdiabetes  Renal/GU negative Renal ROS  negative genitourinary   Musculoskeletal   Abdominal   Peds  Hematology negative hematology ROS (+)   Anesthesia Other Findings Past Medical History:   Polymyositis (HCC)                                           Pulmonary fibrosis (HCC)                                     Diabetes mellitus without complication (HCC)                 Hypertension                                               History reviewed. No pertinent surgical history. BMI    Body Mass Index   25.81 kg/m 2     Reproductive/Obstetrics                             Anesthesia Physical Anesthesia Plan  ASA: III  Anesthesia Plan: General   Post-op Pain Management:    Induction:   Airway Management Planned:   Additional Equipment:   Intra-op Plan:   Post-operative Plan:   Informed Consent: I have reviewed the patients History and Physical, chart, labs and discussed the procedure including the risks, benefits and alternatives for the proposed anesthesia with the patient or authorized representative who has indicated his/her understanding and acceptance.   Dental Advisory Given  Plan Discussed with: CRNA  Anesthesia Plan  Comments:         Anesthesia Quick Evaluation

## 2016-05-25 NOTE — Progress Notes (Signed)
Patient ID: Lee Johnson, male   DOB: 10-19-1941, 75 y.o.   MRN: [id number] [facility] [place] [facility]   PATIENT NAME: Lee Johnson    MR#:  [id number]  DATE OF BIRTH:  10-19-1941  SUBJECTIVE:   Continues with rising bilirubin. No fever, cough or sob  Just got back from EGD and colonoscopy REVIEW OF SYSTEMS:   Review of Systems  Constitutional: Positive for malaise/fatigue. Negative for fever, chills and weight loss.  HENT: Negative for ear discharge, ear pain and nosebleeds.   Eyes: Negative for blurred vision, pain and discharge.  Respiratory: Negative for sputum production, shortness of breath, wheezing and stridor.   Cardiovascular: Negative for chest pain, palpitations, orthopnea and PND.  Gastrointestinal: Negative for nausea, vomiting, abdominal pain and diarrhea.  Genitourinary: Negative for urgency and frequency.  Musculoskeletal: Negative for back pain and joint pain.  Neurological: Positive for weakness. Negative for sensory change, speech change and focal weakness.  Psychiatric/Behavioral: Negative for depression and hallucinations. The patient is not nervous/anxious.   All other systems reviewed and are negative.  Tolerating Diet:yes Tolerating PT: Not needed  DRUG ALLERGIES:   Allergies  Allergen Reactions  . Atorvastatin     Other reaction(s): Muscle Pain  . Singulair [Montelukast Sodium]   . Sulfa Antibiotics Diarrhea  . Sulfamethoxazole-Trimethoprim Diarrhea    VITALS:  Blood pressure 132/65, pulse 62, temperature 98.2 F (36.8 C), temperature source Oral, resp. rate 18, height 5\' 10"  (1.778 m), weight 81.602 kg (179 lb 14.4 oz), SpO2 98 %.  PHYSICAL EXAMINATION:   Physical Exam  GENERAL:  75 y.o.-year-old patient lying in the bed with no acute distress.  EYES: Pupils equal, round, reactive to light and accommodation.++ scleral icterus. Extraocular muscles intact.  HEENT: Head atraumatic, normocephalic. Oropharynx  and nasopharynx clear.  NECK:  Supple, no jugular venous distention. No thyroid enlargement, no tenderness.  LUNGS: Normal breath sounds bilaterally, no wheezing, rales, rhonchi. No use of accessory muscles of respiration.  CARDIOVASCULAR: S1, S2 normal. No murmurs, rubs, or gallops.  ABDOMEN: Soft, nontender, nondistended. Bowel sounds present. No organomegaly or mass.  EXTREMITIES: No cyanosis, clubbing or edema b/l.    NEUROLOGIC: Cranial nerves II through XII are intact. No focal Motor or sensory deficits b/l.   PSYCHIATRIC:  patient is alert and oriented x 3.  SKIN: No obvious rash, lesion, or ulcer. Jaundiced skin  LABORATORY PANEL:  CBC  Recent Labs Lab 05/25/16 0837  WBC 2.6*  HGB 9.0*  HCT 26.4*  PLT 142*    Chemistries   Recent Labs Lab 05/25/16 0837  NA 138  K 3.6  CL 108  CO2 24  GLUCOSE 152*  BUN 17  CREATININE 0.56*  CALCIUM 8.1*  AST 114*  ALT 144*  ALKPHOS 173*  BILITOT 10.4*   Cardiac Enzymes No results for input(s): TROPONINI in the last 168 hours. RADIOLOGY:  No results found. ASSESSMENT AND PLAN:  * Jaundice  Negative for acute hepatitis panel, SMA,AMA negative  GI help with further management.  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  RUQ sono and MRCP are without any clear abnormalities.  pt needs liver biospy Which will be done tomorrow per IR The patient underwent EGD that showed mild gastritis and no active bleeding. Colonoscopy showed multiple small mouth diverticula.  *pancytopenia  Gradual drop in all 3 cell lines.  LDH high,may be hemolysis? Autoimmune?  Hematology consultpending  * acute on ch anemia due to  possible GI blood loss  Guiac positive,  Hemolysis also possible-LDH high  Hb <6 Today. S/p 2 unit BT hgb 8.9 -await GI w/u ofor luminal evaluation   * Lactic acidosis and sepsis due to pneumonia  Patient is immunosuppressed, so he is low white blood cell count is not  reliable as a signal of infection.  He has tachycardia, tachypnea and hypoxia with finding of pneumonia on chest x-ray and high lactic acid.  IV cefepime.total 7 days of Abx,due to immune suppressed status.  MRSA screen is negative- so stop vanc. -change to po cefuroxime. No respiratory symptoms  * Diabetes  We will hold oral medication as patient would be in liquid diet and keep on insulin sliding scale coverage.  * Hypertension  BP stable  * Lung fibrosis and chronic respiratory failure  Continue oxygen supplementation, and treat for pneumonia.  As per Rheumatology- increased prednisone to 20 mg daily.   Case discussed with Care Management/Social Worker. Management plans discussed with the patient, family and they are in agreement.  CODE STATUS:full  DVT Prophylaxis: SCD  TOTAL TIME TAKING CARE OF THIS PATIENT: 30 minutes.  >50% time spent on counselling and coordination of care  Spoke with IR Dr Roxy Horseman and GI dr Servando Snare  Note: This dictation was prepared with Dragon dictation along with smaller phrase technology. Any transcriptional errors that result from this process are unintentional.  Wania Longstreth M.D on 05/25/2016 at 2:19 PM  Between 7am to 6pm - Pager - [phone]  After 6pm go to [email or web address] - password [email or web address] [facility]  [facility] [place]  Office  [phone]  CC: Primary care physician; Dione Housekeeper, MD

## 2016-05-25 NOTE — Progress Notes (Signed)
PT Cancellation Note  Patient Details Name: Lee Johnson Lear MRN: [id number] DOB: [birth year]   Cancelled Treatment:    Reason Eval/Treat Not Completed: Medical issues which prohibited therapy;Other (comment). Pt's chart reviewed. Pt underwent procedure today under general anesthesia and will need new PT orders to participate in therapy. Current orders will be completed. PT will f/u and resume therapy when new orders received.   Adelene IdlerMindy Jo Hikeem Andersson, PT, DPT  05/25/2016, 11:49 AM [phone]

## 2016-05-25 NOTE — Op Note (Signed)
[facility]enterology Patient Name: Dionicia AblerGeorge Shiroma Procedure Date: 05/25/2016 11:33 AM MRN: [id number] Account #: [id number] Date of Birth: 04-17-1941 Admit Type: Inpatient Age: 75 [facility] Gender: Male Note Status: Finalized Procedure:            Upper GI endoscopy Indications:          Acute post hemorrhagic anemia Providers:            Midge Miniumarren Anastasya Jewell, MD Referring MD:         Nat ChristenMario E. Zada Finderslmedo, MD (Referring MD) Medicines:            Propofol per Anesthesia Complications:        No immediate complications. Procedure:            Pre-Anesthesia Assessment:                       - Prior to the procedure, a History and Physical was                        performed, and patient medications and allergies were                        reviewed. The patient's tolerance of previous                        anesthesia was also reviewed. The risks and benefits of                        the procedure and the sedation options and risks were                        discussed with the patient. All questions were                        answered, and informed consent was obtained. Prior                        Anticoagulants: The patient has taken no previous                        anticoagulant or antiplatelet agents. ASA Grade                        Assessment: II - A patient with mild systemic disease.                        After reviewing the risks and benefits, the patient was                        deemed in satisfactory condition to undergo the                        procedure.                       After obtaining informed consent, the endoscope was                        passed under direct vision. Throughout the procedure,  the patient's blood pressure, pulse, and oxygen                        saturations were monitored continuously. The Endoscope                        was introduced through the mouth, and advanced to the           second part of duodenum. The upper GI endoscopy was                        accomplished without difficulty. The patient tolerated                        the procedure well. Findings:      The examined esophagus was normal.      Localized mild inflammation characterized by erythema was found in the       gastric antrum.      The examined duodenum was normal. Impression:           - Normal esophagus.                       - Gastritis.                       - Normal examined duodenum.                       - No specimens collected. Recommendation:       - Perform a colonoscopy today. Procedure Code(s):    --- Professional ---                       [id number], Esophagogastroduodenoscopy, flexible, transoral;                        diagnostic, including collection of specimen(s) by                        brushing or washing, when performed (separate procedure) Diagnosis Code(s):    --- Professional ---                       D62, Acute posthemorrhagic anemia                       K29.70, Gastritis, unspecified, without bleeding CPT copyright 2016 American Medical Association. All rights reserved. The codes documented in this report are preliminary and upon coder review may  be revised to meet current compliance requirements. Midge Minium, MD 05/25/2016 11:42:58 AM This report has been signed electronically. Number of Addenda: 0 Note Initiated On: 05/25/2016 11:33 AM      [facility]

## 2016-05-25 NOTE — Transfer of Care (Signed)
Immediate Anesthesia Transfer of Care Note  Patient: Lee AnonGeorge Evon Johnson  Procedure(s) Performed: Procedure(s): ESOPHAGOGASTRODUODENOSCOPY (EGD) WITH PROPOFOL (N/A) COLONOSCOPY WITH PROPOFOL (N/A)  Patient Location: PACU and Endoscopy Unit  Anesthesia Type:General  Level of Consciousness: awake, alert  and oriented  Airway & Oxygen Therapy: Patient Spontanous Breathing and Patient connected to nasal cannula oxygen  Post-op Assessment: Report given to RN and Post -op Vital signs reviewed and stable  Post vital signs: Reviewed and stable  Last Vitals:  Filed Vitals:   05/25/16 1200 05/25/16 1206  BP: 97/64   Pulse: 106   Temp: 36.2 C 36.2 C  Resp: 25     Last Pain:  Filed Vitals:   05/25/16 1206  PainSc: Asleep         Complications: No apparent anesthesia complications

## 2016-05-25 NOTE — Op Note (Signed)
[facility] Patient Name: Lee Johnson Procedure Date: 05/25/2016 11:32 AM MRN: [id number] Account #: [id number] Date of Birth: 1941-04-10 Admit Type: Inpatient Age: 75 Room: [facility] ENDO ROOM 3 Gender: Male Note Status: Finalized Procedure:            Colonoscopy Indications:          Heme positive stool Providers:            Midge Minium, MD Referring MD:         Nat Christen. Zada Finders, MD (Referring MD) Medicines:            Propofol per Anesthesia Complications:        No immediate complications. Procedure:            Pre-Anesthesia Assessment:                       - Prior to the procedure, a History and Physical was                        performed, and patient medications and allergies were                        reviewed. The patient's tolerance of previous                        anesthesia was also reviewed. The risks and benefits of                        the procedure and the sedation options and risks were                        discussed with the patient. All questions were                        answered, and informed consent was obtained. Prior                        Anticoagulants: The patient has taken no previous                        anticoagulant or antiplatelet agents. ASA Grade                        Assessment: II - A patient with mild systemic disease.                        After reviewing the risks and benefits, the patient was                        deemed in satisfactory condition to undergo the                        procedure.                       After obtaining informed consent, the colonoscope was                        passed under direct vision. Throughout the procedure,  the patient's blood pressure, pulse, and oxygen                        saturations were monitored continuously. The                        Colonoscope was introduced through the anus and                        advanced to the the  cecum, identified by appendiceal                        orifice and ileocecal valve. The colonoscopy was                        performed without difficulty. The patient tolerated the                        procedure well. The quality of the bowel preparation                        was excellent. Findings:      The perianal and digital rectal examinations were normal.      Multiple small-mouthed diverticula were found in the sigmoid colon.      Non-bleeding internal hemorrhoids were found during retroflexion. The       hemorrhoids were Grade II (internal hemorrhoids that prolapse but reduce       spontaneously). Impression:           - Diverticulosis in the sigmoid colon.                       - Non-bleeding internal hemorrhoids.                       - No specimens collected. Recommendation:       - No fresh or old blood seen. Procedure Code(s):    --- Professional ---                       [phone], Colonoscopy, flexible; diagnostic, including                        collection of specimen(s) by brushing or washing, when                        performed (separate procedure) Diagnosis Code(s):    --- Professional ---                       R19.5, Other fecal abnormalities                       K64.1, Second degree hemorrhoids                       K57.30, Diverticulosis of large intestine without                        perforation or abscess without bleeding CPT copyright 2016 [employer]. All rights reserved. The codes documented in this report are preliminary and upon coder review may  be revised to meet current compliance requirements. Midge Miniumarren Trannie Bardales,  MD 05/25/2016 11:54:46 AM This report has been signed electronically. Number of Addenda: 0 Note Initiated On: 05/25/2016 11:32 AM Scope Withdrawal Time: 0 hours 3 minutes 56 seconds  Total Procedure Duration: 0 hours 8 minutes 51 seconds       [facility]

## 2016-05-25 NOTE — Consult Note (Signed)
[facility] CONSULT NOTE  Patient Care Team: Dione Housekeeper, MD as PCP - General  CHIEF COMPLAINTS/PURPOSE OF CONSULTATION: pancytopenia/hemolysis  HISTORY OF PRESENTING ILLNESS: patient appears tired/and has patient's wife is a historian at the bedside.  Lee Johnson 75 y.o.  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 Patient's bilirubin has been in the range of 8-9; and also AST ALT being elevated 2-3 times normal limits. Ultrasound of the abdomen shows- possible cirrhosis; MRCP-edematous pancreas- without any clinical signs of hepatitis.  Patient'shemoglobin at presentation was 9.2 then dropped to 5.8; for which he received 2 units of blood and today hemoglobin is around 9. LDH is mildly elevated at 273. Patient's white count is 2.6-absolute neutrophil count of 1.3. and platelet count is improving at 142. Hematology has been consulted for possible hemolysis.  Patient is a poor appetite. Clay colored stools. Yellowing of the skin. No chest pain. But generalized weakness. Positive for nausea no vomiting. No blood in stools black questions. Patient is awaiting a colonoscopy/upper endoscopy to rule out for the cause of anemia.  ROS: A complete 10 point review of system is done which is negative except mentioned above in history of present illness  MEDICAL HISTORY:  Past Medical History  Diagnosis Date  . Polymyositis (HCC)   . Pulmonary fibrosis (HCC)   . Diabetes mellitus without complication (HCC)   . Hypertension     SURGICAL HISTORY: History reviewed. No pertinent past surgical history.  SOCIAL HISTORY: Social History   Social History  . Marital Status: Married    Spouse Name: N/A  . Number of Children: N/A  . Years of Education: N/A   Occupational History  . Not on file.   Social History Main Topics  .  Smoking status: Never Smoker   . Smokeless tobacco: Not on file  . Alcohol Use: No  . Drug Use: No  . Sexual Activity: No   Other Topics Concern  . Not on file   Social History Narrative  . No narrative on file    FAMILY HISTORY: Family History  Problem Relation Age of Onset  . CAD Mother     ALLERGIES:  is allergic to atorvastatin; sulfa antibiotics; and sulfamethoxazole-trimethoprim.  MEDICATIONS:  Current Facility-Administered Medications  Medication Dose Route Frequency Provider Last Rate Last Dose  . 0.9 %  sodium chloride infusion   Intravenous Continuous Altamese Dilling, MD 75 mL/hr at 05/25/16 0419    . cefUROXime (CEFTIN) tablet 500 mg  500 mg Oral BID WC Enedina Finner, MD   500 mg at 05/24/16 1822  . feeding supplement (ENSURE ENLIVE) (ENSURE ENLIVE) liquid 237 mL  237 mL Oral BID BM Enedina Finner, MD   237 mL at 05/24/16 1030  . insulin aspart (novoLOG) injection 0-9 Units  0-9 Units Subcutaneous TID WC Altamese Dilling, MD   3 Units at 05/24/16 1820  . insulin detemir (LEVEMIR) injection 8 Units  8 Units Subcutaneous QHS Enedina Finner, MD   8 Units at 05/24/16 2215  . predniSONE (DELTASONE) tablet 10 mg  10 mg Oral BID WC Altamese Dilling, MD   10 mg at 05/24/16 1821      .  PHYSICAL EXAMINATION:   Filed Vitals:   05/24/16 2035 05/25/16 0457  BP: 131/60 133/53  Pulse: 66 70  Temp: 98.3 F (36.8 C) 97.8 F (36.6  C)  Resp: 19 17   Filed Weights   05/18/16 1056 05/18/16 1639  Weight: 185 lb (83.915 kg) 179 lb 14.4 oz (81.602 kg)    GENERAL: patient appears tired. He appears Alert, no distress and comfortable. Accompanied by his wife. EYES: positive for icterus. OROPHARYNX: no thrush or ulceration NECK: supple, no masses felt LYMPH:  no palpable lymphadenopathy in the cervical, axillary or inguinal regions LUNGS: clear to auscultation and  No wheeze or crackles HEART/CVS: regular rate & rhythm and no murmurs; No lower extremity edema ABDOMEN:  abdomen soft, non-tender and normal bowel sounds Musculoskeletal:no cyanosis of digits and no clubbing  PSYCH: alert & oriented x 3  NEURO: no focal motor/sensory deficits SKIN:  Positive for jaundice.  LABORATORY DATA:  I have reviewed the data as listed Lab Results  Component Value Date   WBC 2.3* 05/24/2016   HGB 8.7* 05/24/2016   HCT 24.9* 05/24/2016   MCV 97.1 05/24/2016   PLT 124* 05/24/2016    Recent Labs  05/19/16 0341  05/22/16 0554 05/23/16 0410 05/24/16 0350  NA 136  < > 138 139 139  K 3.9  < > 3.5 3.7 4.2  CL 102  < > 104 108 109  CO2 23  < > 30 27 25   GLUCOSE 94  < > 168* 182* 210*  BUN 21*  < > 21* 26* 30*  CREATININE 0.63  < > 0.63 0.64 0.67  CALCIUM 8.2*  < > 8.0* 7.9* 8.1*  GFRNONAA >60  < > >60 >60 >60  GFRAA >60  < > >60 >60 >60  PROT 4.7*  < > 4.5* 4.6* 4.8*  ALBUMIN 2.1*  < > 1.8* 1.8* 1.9*  AST 110*  < > 94* 87* 94*  ALT 100*  < > 107* 111* 127*  ALKPHOS 112  < > 113 122 152*  BILITOT 7.3*  < > 8.7* 8.5* 9.6*  BILIDIR 4.4*  --   --  5.3*  --   IBILI 2.9*  --   --   --   --   < > = values in this interval not displayed.  RADIOGRAPHIC STUDIES: I have personally reviewed the radiological images as listed and agreed with the findings in the report. Dg Chest 2 View  05/18/2016  CLINICAL DATA:  Worsening shortness of Breath EXAM: CHEST  2 VIEW COMPARISON:  By report from 07/12/2015 FINDINGS: Cardiac shadow is enlarged. Bilateral lower lobe interstitial prominence is noted stable by report from the prior exam. Increased densities node in the left mid lung which may represent some acute on chronic infiltrate. No bony abnormality is seen. IMPRESSION: Bibasilar chronic changes by report. Increased density in the left mid lung which may represent acute on chronic infiltrate. Electronically Signed   By: Alcide CleverMark  Lukens M.D.   On: 05/18/2016 12:21   Mr Abdomen Mrcp Wo Cm  05/19/2016  CLINICAL DATA:  Cholestasis.  Elevated bili Rubin, AST and ALT. EXAM: MRI  ABDOMEN WITHOUT CONTRAST  (INCLUDING MRCP) TECHNIQUE: Multiplanar multisequence MR imaging of the abdomen was performed. Heavily T2-weighted images of the biliary and pancreatic ducts were obtained, and three-dimensional MRCP images were rendered by post processing. COMPARISON:  None. FINDINGS: Lower chest: Small bilateral pleural effusions are identified. No acute findings. Hepatobiliary: Mild perihepatic ascites. No focal liver abnormality. No intrahepatic bile duct dilatation. Periportal edema is noted. The gallbladder appears collapsed. Diffuse pericholecystic fluid is identified. No biliary dilatation. No intrahepatic bile duct dilatation. No mass visualized on this unenhanced  exam. Pancreas: There is diffuse edema involving the pancreas. The pancreatic duct has a normal caliber. No mass or fluid collection identified associated with the pancreas. Spleen:  Perisplenic ascites.  Within normal limits in size. Adrenals/Urinary Tract: No adrenal mass identified. Mild nonspecific perinephric fat stranding. No evidence of renal mass or hydronephrosis. Stomach/Bowel: The stomach an the visualized upper abdominal bowel loops have a normal caliber. Vascular/Lymphatic: No pathologically enlarged lymph nodes identified. No evidence of abdominal aortic aneurysm. Other:  None. Musculoskeletal:  No suspicious bone lesions identified. IMPRESSION: 1. Mild diffuse pancreas edema identified suggestive of acute pancreatitis. No pseudocysts identified. 2. The gallbladder is collapsed. No intra or extrahepatic bile duct dilatation. 3. Upper abdominal ascites. 4. Pleural effusions. Electronically Signed   By: Signa Kell M.D.   On: 05/19/2016 10:40   US Abdomen Limited Ruq  05/19/2016  CLINICAL DATA:  Jaundice. EXAM: US ABDOMEN LIMITED - RIGHT UPPER QUADRANT COMPARISON:  Chest x-ray 05/18/2016.  Ultrasound 11/10/2014. FINDINGS: Gallbladder: The gallbladder is not visualized. Increased echogenicity is noted in the region the  gallbladder fossa. A mass lesion or air within the gallbladder fossa cannot be excluded. IV and oral contrast enhanced CT of the abdomen pelvis suggested for further evaluation. Common bile duct: Diameter: 2.7 mm Liver: Liver echotexture is coarse. Contour is slightly nodular. Cirrhosis cannot be excluded. Portal vein is patent with normal directional flow. IMPRESSION: 1. Prominent echogenic focus noted in the region of the gallbladder fossa. Echogenic mass or air within the gallbladder fossa cannot be excluded. IV and oral contrast enhanced CT of the abdomen and pelvis suggested for further evaluation. No biliary distention. 2. Liver has a heterogeneous echotexture slightly nodular contour suggesting cirrhosis. Portal vein is patent with normal directional flow. Electronically Signed   By: Maisie Fus  Register   On: 05/19/2016 09:14    ASSESSMENT & PLAN:   #  75 year old male patient  History of polymyositis on chronic prednisone/ Imuran is currently admitted to the hospital for worsening jaundice.  Patient also noted to have pancytopenia.  #  Pancytopenia- white count 2.6/ hemoglobin 9.0/ platelets 142.  Patient  Received 2 units of PRBC-  When the hemoglobin was as low as 6.  The pancytopenia is likely related to liver disease;  Related to  Imuran.;  Rather than a primary bone marrow process.  Unlikely hemolysis-  As LDH would have been significantly elevated for his degree of anemia.  Awaiting on  Haptoglobin. Awaiting colonoscopy/EGD.   #  Elevated bilirubin/ elevated LFTs-  The context of  Possible cirrhosis as noted on the  Ultrasound.  The etiology of elevated LFTs of unclear.  Currently being followed by GI- possible liver biopsy.  Thank you Dr.Patel  for allowing me to participate in the care of your pleasant patient. Please do not hesitate to contact me with questions or concerns in the interim.The above plan of care was also discussed with the patient's wife by the bedside.    Earna Coder, MD 05/25/2016 7:34 AM

## 2016-05-26 ENCOUNTER — Inpatient Hospital Stay: Payer: Medicare Other

## 2016-05-26 LAB — CBC
HCT: 25.2 % — ABNORMAL LOW (ref 40.0–52.0)
HEMOGLOBIN: 8.8 g/dL — AB (ref 13.0–18.0)
MCH: 35.1 pg — ABNORMAL HIGH (ref 26.0–34.0)
MCHC: 34.7 g/dL (ref 32.0–36.0)
MCV: 101 fL — ABNORMAL HIGH (ref 80.0–100.0)
PLATELETS: 151 10*3/uL (ref 150–440)
RBC: 2.49 MIL/uL — AB (ref 4.40–5.90)
RDW: 26 % — ABNORMAL HIGH (ref 11.5–14.5)
WBC: 2.6 10*3/uL — ABNORMAL LOW (ref 3.8–10.6)

## 2016-05-26 LAB — HEPATIC FUNCTION PANEL
ALK PHOS: 176 U/L — AB (ref 38–126)
ALT: 144 U/L — AB (ref 17–63)
AST: 105 U/L — ABNORMAL HIGH (ref 15–41)
Albumin: 1.8 g/dL — ABNORMAL LOW (ref 3.5–5.0)
BILIRUBIN DIRECT: 5.4 mg/dL — AB (ref 0.1–0.5)
BILIRUBIN INDIRECT: 3.4 mg/dL — AB (ref 0.3–0.9)
TOTAL PROTEIN: 5 g/dL — AB (ref 6.5–8.1)
Total Bilirubin: 8.8 mg/dL — ABNORMAL HIGH (ref 0.3–1.2)

## 2016-05-26 LAB — GLUCOSE, CAPILLARY
GLUCOSE-CAPILLARY: 142 mg/dL — AB (ref 65–99)
GLUCOSE-CAPILLARY: 233 mg/dL — AB (ref 65–99)
Glucose-Capillary: 192 mg/dL — ABNORMAL HIGH (ref 65–99)

## 2016-05-26 LAB — HAPTOGLOBIN: HAPTOGLOBIN: 55 mg/dL (ref 34–200)

## 2016-05-26 MED ORDER — MIDAZOLAM HCL 5 MG/5ML IJ SOLN
INTRAMUSCULAR | Status: AC | PRN
  Administered 2016-05-26: 1 mg via INTRAVENOUS

## 2016-05-26 MED ORDER — FENTANYL CITRATE (PF) 100 MCG/2ML IJ SOLN
INTRAMUSCULAR | Status: AC
  Filled 2016-05-26: qty 4

## 2016-05-26 MED ORDER — FENTANYL CITRATE (PF) 100 MCG/2ML IJ SOLN
INTRAMUSCULAR | Status: AC | PRN
  Administered 2016-05-26: 50 ug via INTRAVENOUS

## 2016-05-26 MED ORDER — MIDAZOLAM HCL 5 MG/5ML IJ SOLN
INTRAMUSCULAR | Status: AC
  Filled 2016-05-26: qty 5

## 2016-05-26 NOTE — Procedures (Signed)
Liver Bx 18 g right lobe No comp/EBL

## 2016-05-26 NOTE — Anesthesia Postprocedure Evaluation (Signed)
Anesthesia Post Note  Patient: Lee Johnson  Procedure(s) Performed: Procedure(s) (LRB): ESOPHAGOGASTRODUODENOSCOPY (EGD) WITH PROPOFOL (N/A) COLONOSCOPY WITH PROPOFOL (N/A)  Patient location during evaluation: PACU Anesthesia Type: General Level of consciousness: awake and alert Pain management: pain level controlled Vital Signs Assessment: post-procedure vital signs reviewed and stable Respiratory status: spontaneous breathing, nonlabored ventilation, respiratory function stable and patient connected to nasal cannula oxygen Cardiovascular status: blood pressure returned to baseline and stable Postop Assessment: no signs of nausea or vomiting Anesthetic complications: no    Last Vitals:  Filed Vitals:   05/26/16 0912 05/26/16 1234  BP: 131/64 125/74  Pulse: 63 81  Temp:  36.5 C  Resp: 14 16    Last Pain:  Filed Vitals:   05/26/16 1234  PainSc: Asleep                 Yevette EdwardsJames G Adams

## 2016-05-26 NOTE — Care Management Important Message (Signed)
Important Message  Patient Details  Name: Lee Johnson MRN: [id number] Date of Birth: 04-12-1941   Medicare Important Message Given:  Yes    Eber HongGreene, Latonyia Lopata R, RN 05/26/2016, 2:46 PM

## 2016-05-26 NOTE — Consult Note (Signed)
Chief Complaint: Patient was seen in consultation today for  Chief Complaint  Patient presents with  . Jaundice   at the request of * No referring provider recorded for this case *  Referring Physician(s): * No referring provider recorded for this case *  Supervising Physician: Jolaine ClickHoss, Dorota Heinrichs  Patient Status: In-pt / Out-pt  History of Present Illness: Lee Johnson is a 75 y.o. male with polymyositis and on immunosuppressants who presents with jaundice.   Past Medical History  Diagnosis Date  . Polymyositis (HCC)   . Pulmonary fibrosis (HCC)   . Diabetes mellitus without complication (HCC)   . Hypertension     Past Surgical History  Procedure Laterality Date  . Hernia repair    . Testicle removal    . Eye surgery    . Esophagogastroduodenoscopy (egd) with propofol N/A 05/25/2016    Procedure: ESOPHAGOGASTRODUODENOSCOPY (EGD) WITH PROPOFOL;  Surgeon: Midge Miniumarren Wohl, MD;  Location: [facility] ENDOSCOPY;  Service: Endoscopy;  Laterality: N/A;  . Colonoscopy with propofol N/A 05/25/2016    Procedure: COLONOSCOPY WITH PROPOFOL;  Surgeon: Midge Miniumarren Wohl, MD;  Location: [facility] ENDOSCOPY;  Service: Endoscopy;  Laterality: N/A;    Allergies: Atorvastatin; Singulair; Sulfa antibiotics; and Sulfamethoxazole-trimethoprim  Medications: Prior to Admission medications   Medication Sig Start Date End Date Taking? Authorizing Provider  Acetaminophen 500 MG coapsule Take 1,000 mg by mouth daily. Pt also takes PRN   Yes Historical Provider, MD  aspirin EC 81 MG tablet Take 81 mg by mouth daily.    Yes Historical Provider, MD  azaTHIOprine (IMURAN) 50 MG tablet Take 2 tablets by mouth 2 (two) times daily. 03/27/16  Yes Historical Provider, MD  betamethasone valerate lotion (VALISONE) 0.1 % Apply 1 application topically as needed. apply to face 10/07/13  Yes Historical Provider, MD  Calcium Carbonate-Vitamin D 600-400 MG-UNIT tablet Take 1 tablet by mouth daily.    Yes Historical Provider, MD    Cholecalciferol (VITAMIN D-1000 MAX ST) 1000 units tablet Take 1,000 Units by mouth daily.    Yes Historical Provider, MD  enalapril-hydrochlorothiazide (VASERETIC) 10-25 MG tablet Take 0.5 tablets by mouth daily.  12/06/15 12/05/16 Yes Historical Provider, MD  esomeprazole (NEXIUM) 20 MG capsule Take 20 mg by mouth daily.    Yes Historical Provider, MD  Flaxseed, Linseed, (FLAXSEED OIL) 1000 MG CAPS Take 1 capsule by mouth 2 (two) times daily.    Yes Historical Provider, MD  glipiZIDE (GLUCOTROL XL) 10 MG 24 hr tablet Take 10 mg by mouth daily after supper.  05/18/16  Yes Historical Provider, MD  glipiZIDE (GLUCOTROL) 10 MG tablet Take 10 mg by mouth daily.  05/05/16 05/05/17 Yes Historical Provider, MD  glucose (SUNMARK GLUCOSE) 4 GM chewable tablet Chew 1 tablet by mouth as needed. For low blood sugar 03/21/16 03/21/17 Yes Historical Provider, MD  metFORMIN (GLUCOPHAGE-XR) 500 MG 24 hr tablet Take 2 tablets by mouth 2 (two) times daily after a meal. 12/06/15 12/05/16 Yes Historical Provider, MD  Omega-3 Fatty Acids (FISH OIL) 1000 MG CAPS Take 1 capsule by mouth 2 (two) times daily.   Yes Historical Provider, MD  OXYGEN Place 2 L into the nose continuous. Oxygen per nasal cannula continuous   Yes Historical Provider, MD  predniSONE (DELTASONE) 5 MG tablet Take 2 tablets by mouth daily.   Yes Historical Provider, MD  tamsulosin (FLOMAX) 0.4 MG CAPS capsule Take 1 capsule by mouth daily after supper. 07/27/15 07/26/16 Yes Historical Provider, MD     Family History  Problem Relation Age of Onset  . CAD Mother     Social History   Social History  . Marital Status: Married    Spouse Name: N/A  . Number of Children: N/A  . Years of Education: N/A   Social History Main Topics  . Smoking status: Former Games developer  . Smokeless tobacco: None  . Alcohol Use: No  . Drug Use: No  . Sexual Activity: No   Other Topics Concern  . None   Social History Narrative      Review of Systems: A 12 point  ROS discussed and pertinent positives are indicated in the HPI above.  All other systems are negative.  Review of Systems  Vital Signs: BP 147/75 mmHg  Pulse 63  Temp(Src) 97.3 F (36.3 C) (Oral)  Resp 12  Ht  (1.778 m)  Wt 179 lb 14.4 oz (81.602 kg)  BMI 25.81 kg/m2  SpO2 97%  Physical Exam  Constitutional: He is oriented to person, place, and time. He appears well-developed and well-nourished.  Neck: Normal range of motion.  Cardiovascular: Normal rate and regular rhythm.   Pulmonary/Chest: Effort normal and breath sounds normal.  Neurological: He is alert and oriented to person, place, and time.    Mallampati Score:     Imaging: Dg Chest 2 View  05/18/2016  CLINICAL DATA:  Worsening shortness of Breath EXAM: CHEST  2 VIEW COMPARISON:  By report from 07/12/2015 FINDINGS: Cardiac shadow is enlarged. Bilateral lower lobe interstitial prominence is noted stable by report from the prior exam. Increased densities node in the left mid lung which may represent some acute on chronic infiltrate. No bony abnormality is seen. IMPRESSION: Bibasilar chronic changes by report. Increased density in the left mid lung which may represent acute on chronic infiltrate. Electronically Signed   By: Alcide Clever M.D.   On: 05/18/2016 12:21   Mr Abdomen Mrcp Wo Cm  05/19/2016  CLINICAL DATA:  Cholestasis.  Elevated bili Rubin, AST and ALT. EXAM: MRI ABDOMEN WITHOUT CONTRAST  (INCLUDING MRCP) TECHNIQUE: Multiplanar multisequence MR imaging of the abdomen was performed. Heavily T2-weighted images of the biliary and pancreatic ducts were obtained, and three-dimensional MRCP images were rendered by post processing. COMPARISON:  None. FINDINGS: Lower chest: Small bilateral pleural effusions are identified. No acute findings. Hepatobiliary: Mild perihepatic ascites. No focal liver abnormality. No intrahepatic bile duct dilatation. Periportal edema is noted. The gallbladder appears collapsed. Diffuse  pericholecystic fluid is identified. No biliary dilatation. No intrahepatic bile duct dilatation. No mass visualized on this unenhanced exam. Pancreas: There is diffuse edema involving the pancreas. The pancreatic duct has a normal caliber. No mass or fluid collection identified associated with the pancreas. Spleen:  Perisplenic ascites.  Within normal limits in size. Adrenals/Urinary Tract: No adrenal mass identified. Mild nonspecific perinephric fat stranding. No evidence of renal mass or hydronephrosis. Stomach/Bowel: The stomach an the visualized upper abdominal bowel loops have a normal caliber. Vascular/Lymphatic: No pathologically enlarged lymph nodes identified. No evidence of abdominal aortic aneurysm. Other:  None. Musculoskeletal:  No suspicious bone lesions identified. IMPRESSION: 1. Mild diffuse pancreas edema identified suggestive of acute pancreatitis. No pseudocysts identified. 2. The gallbladder is collapsed. No intra or extrahepatic bile duct dilatation. 3. Upper abdominal ascites. 4. Pleural effusions. Electronically Signed   By: Signa Kell M.D.   On: 05/19/2016 10:40   US Abdomen Limited Ruq  05/19/2016  CLINICAL DATA:  Jaundice. EXAM: US ABDOMEN LIMITED - RIGHT UPPER QUADRANT COMPARISON:  Chest x-ray 05/18/2016.  Ultrasound 11/10/2014. FINDINGS: Gallbladder: The gallbladder is not visualized. Increased echogenicity is noted in the region the gallbladder fossa. A mass lesion or air within the gallbladder fossa cannot be excluded. IV and oral contrast enhanced CT of the abdomen pelvis suggested for further evaluation. Common bile duct: Diameter: 2.7 mm Liver: Liver echotexture is coarse. Contour is slightly nodular. Cirrhosis cannot be excluded. Portal vein is patent with normal directional flow. IMPRESSION: 1. Prominent echogenic focus noted in the region of the gallbladder fossa. Echogenic mass or air within the gallbladder fossa cannot be excluded. IV and oral contrast enhanced CT of the  abdomen and pelvis suggested for further evaluation. No biliary distention. 2. Liver has a heterogeneous echotexture slightly nodular contour suggesting cirrhosis. Portal vein is patent with normal directional flow. Electronically Signed   By: Maisie Fus  Register   On: 05/19/2016 09:14    Labs:  CBC:  Recent Labs  05/23/16 0410  05/23/16 2225 05/24/16 0350 05/25/16 0837 05/26/16 0713  WBC 2.2*  --   --  2.3* 2.6* 2.6*  HGB 5.8*  < > 8.8* 8.7* 9.0* 8.8*  HCT 16.6*  --  25.5* 24.9* 26.4* 25.2*  PLT 121*  --   --  124* 142* 151  < > = values in this interval not displayed.  COAGS:  Recent Labs  05/18/16 1150 05/21/16 0529 05/24/16 0350  INR 1.29 1.26 1.01  APTT 39*  --   --     BMP:  Recent Labs  05/22/16 0554 05/23/16 0410 05/24/16 0350 05/25/16 0837  NA 138 139 139 138  K 3.5 3.7 4.2 3.6  CL 104 108 109 108  CO2 30 27 25 24   GLUCOSE 168* 182* 210* 152*  BUN 21* 26* 30* 17  CALCIUM 8.0* 7.9* 8.1* 8.1*  CREATININE 0.63 0.64 0.67 0.56*  GFRNONAA >60 >60 >60 >60  GFRAA >60 >60 >60 >60    LIVER FUNCTION TESTS:  Recent Labs  05/22/16 0554 05/23/16 0410 05/24/16 0350 05/25/16 0837  BILITOT 8.7* 8.5* 9.6* 10.4*  AST 94* 87* 94* 114*  ALT 107* 111* 127* 144*  ALKPHOS 113 122 152* 173*  PROT 4.5* 4.6* 4.8* 4.6*  ALBUMIN 1.8* 1.8* 1.9* 1.9*    TUMOR MARKERS: No results for input(s): AFPTM, CEA, CA199, CHROMGRNA in the last 8760 hours.  Assessment and Plan:  Jaundice, here for liver biopsy.  Thank you for this interesting consult.  I greatly enjoyed meeting Lee Johnson and look forward to participating in their care.  A copy of this report was sent to the requesting provider on this date.  Electronically Signed: Terran Hollenkamp, ART A 05/26/2016, 8:48 AM   I spent a total of 40 Minutes  in face to face in clinical consultation, greater than 50% of which was counseling/coordinating care for liver biopsy.

## 2016-05-26 NOTE — Care Management (Signed)
Spoke with Lee Johnson this morning and she does not think that patient will need any home health.  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  EGD/colonoscopy did not show any active bleeding.  Liver biopsy today.  My consider home health services if attending feels it is really needed.  Patient has not had frequent presentations to [facility].  If did require home health- agency choice would be [facility].  Heads up given

## 2016-05-26 NOTE — Progress Notes (Signed)
Patient ID: Lee Johnson, male   DOB: 05-Jun-1941, 75 y.o.   MRN: [id number] [facility] [place] [facility]   PATIENT NAME: Lee Johnson    MR#:  [id number]  DATE OF BIRTH:  March 22, 1941  SUBJECTIVE:   Continues with rising bilirubin. No fever, cough or sob   REVIEW OF SYSTEMS:   Review of Systems  Constitutional: Positive for malaise/fatigue. Negative for fever, chills and weight loss.  HENT: Negative for ear discharge, ear pain and nosebleeds.   Eyes: Negative for blurred vision, pain and discharge.  Respiratory: Negative for sputum production, shortness of breath, wheezing and stridor.   Cardiovascular: Negative for chest pain, palpitations, orthopnea and PND.  Gastrointestinal: Negative for nausea, vomiting, abdominal pain and diarrhea.  Genitourinary: Negative for urgency and frequency.  Musculoskeletal: Negative for back pain and joint pain.  Neurological: Positive for weakness. Negative for sensory change, speech change and focal weakness.  Psychiatric/Behavioral: Negative for depression and hallucinations. The patient is not nervous/anxious.   All other systems reviewed and are negative.  Tolerating Diet:yes Tolerating PT: Not needed  DRUG ALLERGIES:   Allergies  Allergen Reactions  . Atorvastatin     Other reaction(s): Muscle Pain  . Singulair [Montelukast Sodium]   . Sulfa Antibiotics Diarrhea  . Sulfamethoxazole-Trimethoprim Diarrhea    VITALS:  Blood pressure 125/74, pulse 81, temperature 97.7 F (36.5 C), temperature source Oral, resp. rate 16, height  (1.778 m), weight 81.602 kg (179 lb 14.4 oz), SpO2 99 %.  PHYSICAL EXAMINATION:   Physical Exam  GENERAL:  75 y.o.-year-old patient lying in the bed with no acute distress.  EYES: Pupils equal, round, reactive to light and accommodation.++ scleral icterus. Extraocular muscles intact.  HEENT: Head atraumatic, normocephalic. Oropharynx and nasopharynx clear.  NECK:   Supple, no jugular venous distention. No thyroid enlargement, no tenderness.  LUNGS: Normal breath sounds bilaterally, no wheezing, rales, rhonchi. No use of accessory muscles of respiration.  CARDIOVASCULAR: S1, S2 normal. No murmurs, rubs, or gallops.  ABDOMEN: Soft, nontender, nondistended. Bowel sounds present. No organomegaly or mass.  EXTREMITIES: No cyanosis, clubbing or edema b/l.    NEUROLOGIC: Cranial nerves II through XII are intact. No focal Motor or sensory deficits b/l.   PSYCHIATRIC:  patient is alert and oriented x 3.  SKIN: No obvious rash, lesion, or ulcer. Jaundiced skin  LABORATORY PANEL:  CBC  Recent Labs Lab 05/26/16 0713  WBC 2.6*  HGB 8.8*  HCT 25.2*  PLT 151    Chemistries   Recent Labs Lab 05/25/16 0837  NA 138  K 3.6  CL 108  CO2 24  GLUCOSE 152*  BUN 17  CREATININE 0.56*  CALCIUM 8.1*  AST 114*  ALT 144*  ALKPHOS 173*  BILITOT 10.4*   Cardiac Enzymes No results for input(s): TROPONINI in the last 168 hours. RADIOLOGY:  US Biopsy  05/26/2016  INDICATION: Jaundice EXAM: ULTRASOUND-GUIDED BIOPSY RANDOM LIVER CORE BIOPSY. MEDICATIONS: None. ANESTHESIA/SEDATION: Fentanyl 50 mcg IV; Versed 1 mg IV Moderate Sedation Time:  11 The patient was continuously monitored during the procedure by the interventional radiology nurse under my direct supervision. FLUOROSCOPY TIME:  Fluoroscopy Time:  minutes  seconds ( mGy). COMPLICATIONS: None immediate. PROCEDURE: Informed written consent was obtained from the patient after a thorough discussion of the procedural risks, benefits and alternatives. All questions were addressed. Maximal Sterile Barrier Technique was utilized including caps, mask, sterile gowns, sterile gloves, sterile drape, hand hygiene and skin antiseptic. A timeout was performed  prior to the initiation of the procedure. The right flank was prepped with ChloraPrep in a sterile fashion, and a sterile drape was applied covering the operative field. A  sterile gown and sterile gloves were used for the procedure. Under sonographic guidance, an 17 gauge guide needle was advanced into the right lobe of the liver. Trace ascites was identified. A tract selected through the intercostal space into the liver was free of ascites. Subsequently 3 18 gauge core biopsies were obtained. Gel-Foam slurry was injected into the tract. The guide needle was removed. Final imaging was performed. Patient tolerated the procedure well without complication. Vital sign monitoring by nursing staff during the procedure will continue as patient is in the special procedures unit for post procedure observation. The images document guide needle placement within the right lobe of the liver. Post biopsy images demonstrate no hemorrhage. IMPRESSION: Successful ultrasound-guided random liver core biopsy. Electronically Signed   By: Jolaine ClickArthur  Hoss M.D.   On: 05/26/2016 09:15   ASSESSMENT AND PLAN:  * Jaundice  Negative for acute hepatitis panel, SMA,AMA negative  GI help with further management.  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  RUQ sono and MRCP are without any clear abnormalities. S/p liver biopsy today 05/26/16  *pancytopenia  Gradual drop in all 3 cell lines.  LDH high,may be hemolysis? Autoimmune?  Hematology consult appreciated.  Does not feel tis is due to hemolysis  * acute on ch anemia due to possible GI blood loss  Guaiac positive Hb <6 Today. S/p 2 unit BT hgb 8.9 patient underwent EGD that showed mild gastritis and no active bleeding. Colonoscopy showed multiple small mouth diverticula. On 05/25/16  * Lactic acidosis and sepsis due to pneumonia  Patient is immunosuppressed, so he is low white blood cell count is not reliable as a signal of infection.  He has tachycardia, tachypnea and hypoxia with finding of pneumonia on chest x-ray and high lactic acid. completed po abxs -change to po cefuroxime. No respiratory  symptoms  * Diabetes  resume meds  * Hypertension  BP stable  * Lung fibrosis and chronic respiratory failure  Continue oxygen supplementation, and treat for pneumonia.  As per Rheumatology- increased prednisone to 20 mg daily.   Case discussed with Care Management/Social Worker. Management plans discussed with the patient, family and they are in agreement.  CODE STATUS:full  DVT Prophylaxis: SCD  TOTAL TIME TAKING CARE OF THIS PATIENT: 30 minutes.  >50% time spent on counselling and coordination of care  Spoke with IR Dr Bonnielee HaffHoss and GI dr Servando Snarewohl  Note: This dictation was prepared with Dragon dictation along with smaller phrase technology. Any transcriptional errors that result from this process are unintentional.  Lee Johnson M.D on 05/26/2016 at 12:56 PM  Between 7am to 6pm - Pager - [phone]  After 6pm go to [email or web address] - password [email or web address]  [facility]  Office  [phone]  CC: Primary care physician; Dione Housekeeperlmedo, Mario Ernesto, MD

## 2016-05-27 LAB — GLUCOSE, CAPILLARY
GLUCOSE-CAPILLARY: 135 mg/dL — AB (ref 65–99)
GLUCOSE-CAPILLARY: 212 mg/dL — AB (ref 65–99)

## 2016-05-27 MED ORDER — PREDNISONE 5 MG PO TABS: 20.0000 mg | ORAL_TABLET | Freq: Every day | ORAL | Status: DC

## 2016-05-27 NOTE — Care Management Note (Signed)
Case Management Note  Patient Details  Name: Lee Johnson MRN: [id number] Date of Birth: [birth year]  Subjective/Objective:    Discussed discharge planning with Dr Enedina FinnerSona Patel who does not think HH is needed.                 Action/Plan:   Expected Discharge Date:                  Expected Discharge Plan:     In-House Referral:     Discharge planning Services     Post Acute Care Choice:    Choice offered to:     DME Arranged:    DME Agency:     HH Arranged:    HH Agency:     Status of Service:     Medicare Important Message Given:  Yes Date Medicare IM Given:    Medicare IM give by:    Date Additional Medicare IM Given:    Additional Medicare Important Message give by:     If discussed at Long Length of Stay Meetings, dates discussed:    Additional Comments:  Marin Wisner A, RN 05/27/2016, 9:10 AM

## 2016-05-27 NOTE — Progress Notes (Signed)
Pt A and O x 4. VSS. Pt tolerating diet well. No complaints of pain or nausea. IV removed intact, prescriptions given. Pt voiced understanding of discharge instructions with no further questions. Pt discharged via wheelchair with nurse tech.   

## 2016-05-27 NOTE — Discharge Summary (Signed)
[facility] [place] [facility]   PATIENT NAME: Lee Johnson    MR#:  [id number]  DATE OF BIRTH:  December 16, 1941  DATE OF ADMISSION:  05/18/2016 ADMITTING PHYSICIAN: Altamese DillingVaibhavkumar Vachhani, MD  DATE OF DISCHARGE: 05/27/16  PRIMARY CARE PHYSICIAN: Dione Housekeeperlmedo, Mario Ernesto, MD    ADMISSION DIAGNOSIS:  Pulmonary fibrosis (HCC) [J84.10] Hyperbilirubinemia [E80.6] Acute hepatitis [K72.00] Jaundice [R17] Immunocompromised state (HCC) [D84.9] HCAP (healthcare-associated pneumonia) [J18.9] Elevated lactic acid level [E87.2] Sepsis, due to unspecified organism (HCC) [A41.9]  DISCHARGE DIAGNOSIS:  Jaundice etiology unclear. 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 Polymyositis (HCC)   . Pulmonary fibrosis (HCC)   . Diabetes mellitus without complication (HCC)   . Hypertension     HOSPITAL COURSE:   * Jaundice  Negative for acute hepatitis panel, SMA,AMA negative  GI help with further management.  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  RUQ sono and MRCP are without any clear abnormalities. S/p liver biopsy today 05/26/16. Results will be available next week and pt will d/w it with dr Servando Snarewohl as outpt -bilirubin 7.3--8.4--9.3--9.8--10.4---8.8 Lft's stable  *pancytopenia  Gradual drop in all 3 cell lines.  LDH high mild  Hematology consult appreciated. Does not feel this is due to hemolysis  * acute on chronic anemia due to possible GI blood loss Guaiac positive Hb <6 Today. S/p 2 unit BT hgb 8.9 patient underwent EGD that showed mild gastritis and no active bleeding. Colonoscopy showed multiple small mouth diverticula. On 05/25/16  * Lactic acidosis and sepsis due to pneumonia with h/o pulmonary fibrosis  Patient is immunosuppressed, so he is low white blood cell count is not reliable  as a signal of infection.  completed po abxs  No respiratory symptoms. On chronic home oxygen  * Diabetes  resume meds  * Hypertension  BP stable  * Lung fibrosis and chronic respiratory failure and h/o polymyoisitis  Continue oxygen supplementation  As per Rheumatology- increased prednisone to 20 mg daily. D/ced azathioprine   CONSULTS OBTAINED:  Treatment Team:  Midge Miniumarren Wohl, MD Kandyce RudGeorge W Kernodle Jr., MD Earna CoderGovinda R Brahmanday, MD  DRUG ALLERGIES:   Allergies  Allergen Reactions  . Atorvastatin     Other reaction(s): Muscle Pain  . Singulair [Montelukast Sodium]   . Sulfa Antibiotics Diarrhea  . Sulfamethoxazole-Trimethoprim Diarrhea    DISCHARGE MEDICATIONS:   Current Discharge Medication List    CONTINUE these medications which have CHANGED   Details  predniSONE (DELTASONE) 5 MG tablet Take 4 tablets (20 mg total) by mouth daily. Qty: 30 tablet, Refills: 0      CONTINUE these medications which have NOT CHANGED   Details  Acetaminophen 500 MG coapsule Take 1,000 mg by mouth daily. Pt also takes PRN    aspirin EC 81 MG tablet Take 81 mg by mouth daily.     betamethasone valerate lotion (VALISONE) 0.1 % Apply 1 application topically as needed. apply to face    Calcium Carbonate-Vitamin D 600-400 MG-UNIT tablet Take 1 tablet by mouth daily.     Cholecalciferol (VITAMIN D-1000 MAX ST) 1000 units tablet Take 1,000 Units by mouth daily.     enalapril-hydrochlorothiazide (VASERETIC) 10-25 MG tablet Take 0.5 tablets by mouth daily.     esomeprazole (NEXIUM) 20 MG capsule Take 20 mg by mouth daily.     Flaxseed, Linseed, (FLAXSEED OIL)  1000 MG CAPS Take 1 capsule by mouth 2 (two) times daily.     glipiZIDE (GLUCOTROL XL) 10 MG 24 hr tablet Take 10 mg by mouth daily after supper.     glipiZIDE (GLUCOTROL) 10 MG tablet Take 10 mg by mouth daily.     glucose (SUNMARK GLUCOSE) 4 GM chewable tablet Chew 1 tablet by mouth as needed. For low blood sugar     metFORMIN (GLUCOPHAGE-XR) 500 MG 24 hr tablet Take 2 tablets by mouth 2 (two) times daily after a meal.    Omega-3 Fatty Acids (FISH OIL) 1000 MG CAPS Take 1 capsule by mouth 2 (two) times daily.    OXYGEN Place 2 L into the nose continuous. Oxygen per nasal cannula continuous    tamsulosin (FLOMAX) 0.4 MG CAPS capsule Take 1 capsule by mouth daily after supper.      STOP taking these medications     azaTHIOprine (IMURAN) 50 MG tablet         If you experience worsening of your admission symptoms, develop shortness of breath, life threatening emergency, suicidal or homicidal thoughts you must seek medical attention immediately by calling 911 or calling your MD immediately  if symptoms less severe.  You Must read complete instructions/literature along with all the possible adverse reactions/side effects for all the Medicines you take and that have been prescribed to you. Take any new Medicines after you have completely understood and accept all the possible adverse reactions/side effects.   Please note  You were cared for by a hospitalist during your hospital stay. If you have any questions about your discharge medications or the care you received while you were in the hospital after you are discharged, you can call the unit and asked to speak with the hospitalist on call if the hospitalist that took care of you is not available. Once you are discharged, your primary care physician will handle any further medical issues. Please note that NO REFILLS for any discharge medications will be authorized once you are discharged, as it is imperative that you return to your primary care physician (or establish a relationship with a primary care physician if you do not have one) for your aftercare needs so that they can reassess your need for medications and monitor your lab values. Today   SUBJECTIVE   Doing well  VITAL SIGNS:  Blood pressure 146/75, pulse 64, temperature 97.8 F (36.6 C),  temperature source Oral, resp. rate 14, height 5\' 10"  (1.778 m), weight 81.602 kg (179 lb 14.4 oz), SpO2 100 %.  I/O:   Intake/Output Summary (Last 24 hours) at 05/27/16 0803 Last data filed at 05/27/16 4098  Gross per 24 hour  Intake    540 ml  Output   1050 ml  Net   -510 ml    PHYSICAL EXAMINATION:  GENERAL:  75 y.o.-year-old patient lying in the bed with no acute distress.  EYES: Pupils equal, round, reactive to light and accommodation.++ scleral icterus. Extraocular muscles intact.  HEENT: Head atraumatic, normocephalic. Oropharynx and nasopharynx clear.  NECK:  Supple, no jugular venous distention. No thyroid enlargement, no tenderness.  LUNGS: Normal breath sounds bilaterally, no wheezing, rales,rhonchi or crepitation. No use of accessory muscles of respiration.  CARDIOVASCULAR: S1, S2 normal. No murmurs, rubs, or gallops.  ABDOMEN: Soft, non-tender, non-distended. Bowel sounds present. No organomegaly or mass.  EXTREMITIES: No pedal edema, cyanosis, or clubbing.  NEUROLOGIC: Cranial nerves II through XII are intact. Muscle strength 5/5 in all extremities. Sensation intact.  Gait not checked.  PSYCHIATRIC: The patient is alert and oriented x 3.  SKIN: No obvious rash, lesion, or ulcer.  Jaundice+ DATA REVIEW:   CBC   Recent Labs Lab 05/26/16 0713  WBC 2.6*  HGB 8.8*  HCT 25.2*  PLT 151    Chemistries   Recent Labs Lab 05/25/16 0837 05/26/16 0715  NA 138  --   K 3.6  --   CL 108  --   CO2 24  --   GLUCOSE 152*  --   BUN 17  --   CREATININE 0.56*  --   CALCIUM 8.1*  --   AST 114* 105*  ALT 144* 144*  ALKPHOS 173* 176*  BILITOT 10.4* 8.8*    Microbiology Results   Recent Results (from the past 240 hour(s))  Blood culture (routine x 2)     Status: None   Collection Time: 05/18/16  1:00 PM  Result Value Ref Range Status   Specimen Description BLOOD LEFT ASSIST CONTROL  Final   Special Requests BOTTLES DRAWN AEROBIC AND ANAEROBIC 2CCAERO,2CCANA  Final    Culture NO GROWTH 5 DAYS  Final   Report Status 05/23/2016 FINAL  Final  Blood culture (routine x 2)     Status: None   Collection Time: 05/18/16  1:00 PM  Result Value Ref Range Status   Specimen Description BLOOD RIGHT ASSIST CONTROL  Final   Special Requests BOTTLES DRAWN AEROBIC AND ANAEROBIC 2CCAERO,2CCANA  Final   Culture NO GROWTH 5 DAYS  Final   Report Status 05/23/2016 FINAL  Final  Urine culture     Status: None   Collection Time: 05/18/16  1:42 PM  Result Value Ref Range Status   Specimen Description URINE, RANDOM  Final   Special Requests NONE  Final   Culture NO GROWTH Performed at [facility]   Final   Report Status 05/19/2016 FINAL  Final  MRSA PCR Screening     Status: None   Collection Time: 05/19/16  3:20 PM  Result Value Ref Range Status   MRSA by PCR NEGATIVE NEGATIVE Final    Comment:        The GeneXpert MRSA Assay (FDA approved for NASAL specimens only), is one component of a comprehensive MRSA colonization surveillance program. It is not intended to diagnose MRSA infection nor to guide or monitor treatment for MRSA infections.     RADIOLOGY:  US Biopsy  05/26/2016  INDICATION: Jaundice EXAM: ULTRASOUND-GUIDED BIOPSY RANDOM LIVER CORE BIOPSY. MEDICATIONS: None. ANESTHESIA/SEDATION: Fentanyl 50 mcg IV; Versed 1 mg IV Moderate Sedation Time:  11 The patient was continuously monitored during the procedure by the interventional radiology nurse under my direct supervision. FLUOROSCOPY TIME:  Fluoroscopy Time:  minutes  seconds ( mGy). COMPLICATIONS: None immediate. PROCEDURE: Informed written consent was obtained from the patient after a thorough discussion of the procedural risks, benefits and alternatives. All questions were addressed. Maximal Sterile Barrier Technique was utilized including caps, mask, sterile gowns, sterile gloves, sterile drape, hand hygiene and skin antiseptic. A timeout was performed prior to the initiation of the procedure. The  right flank was prepped with ChloraPrep in a sterile fashion, and a sterile drape was applied covering the operative field. A sterile gown and sterile gloves were used for the procedure. Under sonographic guidance, an 17 gauge guide needle was advanced into the right lobe of the liver. Trace ascites was identified. A tract selected through the intercostal space into the liver was free of ascites. Subsequently 3  18 gauge core biopsies were obtained. Gel-Foam slurry was injected into the tract. The guide needle was removed. Final imaging was performed. Patient tolerated the procedure well without complication. Vital sign monitoring by nursing staff during the procedure will continue as patient is in the special procedures unit for post procedure observation. The images document guide needle placement within the right lobe of the liver. Post biopsy images demonstrate no hemorrhage. IMPRESSION: Successful ultrasound-guided random liver core biopsy. Electronically Signed   By: Jolaine Click M.D.   On: 05/26/2016 09:15     Management plans discussed with the patient, family and they are in agreement.  CODE STATUS:     Code Status Orders        Start     Ordered   05/18/16 1635  Do not attempt resuscitation (DNR)   Continuous    Question Answer Comment  In the event of cardiac or respiratory ARREST Do not call a "code blue"   In the event of cardiac or respiratory ARREST Do not perform Intubation, CPR, defibrillation or ACLS   In the event of cardiac or respiratory ARREST Use medication by any route, position, wound care, and other measures to relive pain and suffering. May use oxygen, suction and manual treatment of airway obstruction as needed for comfort.      05/18/16 1634    Code Status History    Date Active Date Inactive Code Status Order ID Comments User Context   This patient has a current code status but no historical code status.      TOTAL TIME TAKING CARE OF THIS PATIENT: 40 minutes.     Issam Carlyon M.D on 05/27/2016 at 8:03 AM  Between 7am to 6pm - Pager - [phone] After 6pm go to [email or web address] - password [email or web address] [facility]  [facility]  Office  [phone]  CC: Primary care physician; Dione Housekeeper, MD

## 2016-05-29 ENCOUNTER — Telehealth: Payer: Self-pay | Admitting: Gastroenterology

## 2016-05-29 NOTE — Telephone Encounter (Signed)
Patients wife is looking for results for his liver biopsy. He also h

## 2016-05-29 NOTE — Telephone Encounter (Signed)
Patients wife is looking for results for his liver biopsy. He also had a colon/endoscopy.

## 2016-05-30 NOTE — Telephone Encounter (Signed)
Patient is calling back looking for results

## 2016-05-30 NOTE — Telephone Encounter (Signed)
Informed pt's wife liver biopsy results are not back yet. I will contact her as soon as we receive.

## 2016-05-31 LAB — ALPHA-1 ANTITRYPSIN PHENOTYPE: A1 ANTITRYPSIN SER: 179 mg/dL (ref 90–200)

## 2016-05-31 LAB — HAPTOGLOBIN: Haptoglobin: 43 mg/dL (ref 34–200)

## 2016-07-18 LAB — SURGICAL PATHOLOGY

## 2018-08-15 ENCOUNTER — Encounter: Payer: Self-pay | Admitting: Emergency Medicine

## 2018-08-15 ENCOUNTER — Other Ambulatory Visit: Payer: Self-pay

## 2018-08-15 ENCOUNTER — Emergency Department
Admission: EM | Admit: 2018-08-15 | Discharge: 2018-08-15 | Disposition: A | Discharge status: Home / Self Care | Payer: Medicare Other | Attending: Emergency Medicine | Admitting: Emergency Medicine

## 2018-08-15 DIAGNOSIS — E119 Type 2 diabetes mellitus without complications: Secondary | ICD-10-CM | POA: Insufficient documentation

## 2018-08-15 DIAGNOSIS — R531 Weakness: Secondary | ICD-10-CM | POA: Insufficient documentation

## 2018-08-15 DIAGNOSIS — R110 Nausea: Secondary | ICD-10-CM | POA: Diagnosis present

## 2018-08-15 DIAGNOSIS — Z87891 Personal history of nicotine dependence: Secondary | ICD-10-CM | POA: Diagnosis not present

## 2018-08-15 DIAGNOSIS — I10 Essential (primary) hypertension: Secondary | ICD-10-CM | POA: Diagnosis not present

## 2018-08-15 DIAGNOSIS — E860 Dehydration: Secondary | ICD-10-CM

## 2018-08-15 LAB — BASIC METABOLIC PANEL
Anion gap: 13 (ref 5–15)
BUN: 35 mg/dL — AB (ref 8–23)
CHLORIDE: 98 mmol/L (ref 98–111)
CO2: 23 mmol/L (ref 22–32)
CREATININE: 1.52 mg/dL — AB (ref 0.61–1.24)
Calcium: 9.5 mg/dL (ref 8.9–10.3)
GFR calc Af Amer: 50 mL/min — ABNORMAL LOW (ref >60)
GFR calc non Af Amer: 43 mL/min — ABNORMAL LOW (ref >60)
GLUCOSE: 184 mg/dL — AB (ref 70–99)
POTASSIUM: 3.9 mmol/L (ref 3.5–5.1)
SODIUM: 134 mmol/L — AB (ref 135–145)

## 2018-08-15 LAB — CBC
HEMATOCRIT: 38.5 % — AB (ref 40.0–52.0)
Hemoglobin: 13.2 g/dL (ref 13.0–18.0)
MCH: 32.9 pg (ref 26.0–34.0)
MCHC: 34.3 g/dL (ref 32.0–36.0)
MCV: 96.1 fL (ref 80.0–100.0)
PLATELETS: 219 10*3/uL (ref 150–440)
RBC: 4 MIL/uL — ABNORMAL LOW (ref 4.40–5.90)
RDW: 15.3 % — ABNORMAL HIGH (ref 11.5–14.5)
WBC: 8.4 10*3/uL (ref 3.8–10.6)

## 2018-08-15 LAB — HEPATIC FUNCTION PANEL
ALBUMIN: 3.4 g/dL — AB (ref 3.5–5.0)
ALT: 34 U/L (ref 0–44)
AST: 36 U/L (ref 15–41)
Alkaline Phosphatase: 26 U/L — ABNORMAL LOW (ref 38–126)
BILIRUBIN DIRECT: 0.3 mg/dL — AB (ref 0.0–0.2)
Indirect Bilirubin: 0.7 mg/dL (ref 0.3–0.9)
TOTAL PROTEIN: 6.8 g/dL (ref 6.5–8.1)
Total Bilirubin: 1 mg/dL (ref 0.3–1.2)

## 2018-08-15 LAB — URINALYSIS, COMPLETE (UACMP) WITH MICROSCOPIC
Bilirubin Urine: NEGATIVE
Glucose, UA: NEGATIVE mg/dL
HGB URINE DIPSTICK: NEGATIVE
Ketones, ur: 5 mg/dL — AB
LEUKOCYTES UA: NEGATIVE
Nitrite: NEGATIVE
PROTEIN: 30 mg/dL — AB
SPECIFIC GRAVITY, URINE: 1.029 (ref 1.005–1.030)
pH: 5 (ref 5.0–8.0)

## 2018-08-15 LAB — TROPONIN I: Troponin I: 0.03 ng/mL (ref <0.03)

## 2018-08-15 MED ORDER — SODIUM CHLORIDE 0.9 % IV SOLN
Freq: Once | INTRAVENOUS | Status: AC
  Administered 2018-08-15: 14:00:00 via INTRAVENOUS

## 2018-08-15 MED ORDER — SODIUM CHLORIDE 0.9 % IV SOLN
Freq: Once | INTRAVENOUS | Status: AC
  Administered 2018-08-15: 15:00:00 via INTRAVENOUS

## 2018-08-15 MED ORDER — ONDANSETRON HCL 4 MG/2ML IJ SOLN
4.0000 mg | Freq: Once | INTRAMUSCULAR | Status: AC
Start: 2018-08-15 — End: 2018-08-15
  Administered 2018-08-15: 4 mg via INTRAVENOUS
  Filled 2018-08-15: qty 2

## 2018-08-15 NOTE — ED Notes (Signed)
Patient attempting to give urine at this time. Did not want any help from this RN

## 2018-08-15 NOTE — ED Triage Notes (Addendum)
Pt to ED via POV with c/o nausea and weakness since last night. Per spouse pt has had diarrhea xfew weeks. PT appears fatigue, HR 110s

## 2018-08-15 NOTE — ED Provider Notes (Signed)
[facility] Emergency Department Provider Note       Time seen: ----------------------------------------- 12:10 PM on 08/15/2018 -----------------------------------------   I have reviewed the triage vital signs and the nursing notes.  HISTORY   Chief Complaint Weakness    HPI Lee Johnson is a 77 y.o. male with a history of diabetes, hypertension, poly-myositis, pulmonary fibrosis who presents to the ED for nausea and weakness since last night.  Patient has had one episode of vomiting and has had consistent diarrhea for the past several weeks.  He was encouraged by his doctor did not drink any more milk which has not improved his diarrhea.  Patient states he cannot give us any diarrhea sample at this time.  Past Medical History:  Diagnosis Date  . Diabetes mellitus without complication (HCC)   . Hypertension   . Polymyositis (HCC)   . Pulmonary fibrosis [facility](HCC)     Patient Active Problem List   Diagnosis Date Noted  . Acute posthemorrhagic anemia   . Gastritis   . Second degree hemorrhoids   . Diverticulosis of large intestine without diverticulitis   . Acute hepatitis   . Jaundice 05/18/2016    Past Surgical History:  Procedure Laterality Date  . COLONOSCOPY WITH PROPOFOL N/A 05/25/2016   Procedure: COLONOSCOPY WITH PROPOFOL;  Surgeon: Midge Miniumarren Wohl, MD;  Location: [facility] ENDOSCOPY;  Service: Endoscopy;  Laterality: N/A;  . ESOPHAGOGASTRODUODENOSCOPY (EGD) WITH PROPOFOL N/A 05/25/2016   Procedure: ESOPHAGOGASTRODUODENOSCOPY (EGD) WITH PROPOFOL;  Surgeon: Midge Miniumarren Wohl, MD;  Location: [facility] ENDOSCOPY;  Service: Endoscopy;  Laterality: N/A;  . EYE SURGERY    . HERNIA REPAIR    . TESTICLE REMOVAL      Allergies Atorvastatin; Singulair [montelukast sodium]; Sulfa antibiotics; and Sulfamethoxazole-trimethoprim  Social History Social History   Tobacco Use  . Smoking status: Former Games developermoker  . Smokeless tobacco: Never Used  Substance Use Topics  .  Alcohol use: No  . Drug use: No   Review of Systems Constitutional: Negative for fever. Cardiovascular: Negative for chest pain. Respiratory: Negative for shortness of breath. Gastrointestinal: Negative for abdominal pain, positive for nausea Musculoskeletal: Negative for back pain. Skin: Negative for rash. Neurological: Positive for weakness  All systems negative/normal/unremarkable except as stated in the HPI  ____________________________________________   PHYSICAL EXAM:  VITAL SIGNS: ED Triage Vitals  Enc Vitals Group     BP 08/15/18 1012 120/71     Pulse Rate 08/15/18 1012 (!) 119     Resp 08/15/18 1012 15     Temp 08/15/18 1012 98.2 F (36.8 C)     Temp Source 08/15/18 1012 Oral     SpO2 08/15/18 1012 97 %     Weight 08/15/18 1013 189 lb (85.7 kg)     Height 08/15/18 1013 5\' 9"  (1.753 m)     Head Circumference --      Peak Flow --      Pain Score 08/15/18 1012 8     Pain Loc --      Pain Edu? --      Excl. in GC? --    Constitutional: Alert and oriented. Well appearing and in no distress. Eyes: Conjunctivae are normal. Normal extraocular movements. ENT   Head: Normocephalic and atraumatic.   Nose: No congestion/rhinnorhea.   Mouth/Throat: Mucous membranes are moist.   Neck: No stridor. Cardiovascular: Normal rate, regular rhythm. No murmurs, rubs, or gallops. Respiratory: Normal respiratory effort without tachypnea nor retractions. Breath sounds are clear and equal bilaterally. No wheezes/rales/rhonchi. Gastrointestinal:  Soft and nontender. Normal bowel sounds Musculoskeletal: Nontender with normal range of motion in extremities. No lower extremity tenderness nor edema. Neurologic:  Normal speech and language. No gross focal neurologic deficits are appreciated.  Generalized weakness, nothing focal Skin: Skin appears somewhat icteric Psychiatric: Mood and affect are normal. Speech and behavior are normal.   ____________________________________________  EKG: Interpreted by me.  Sinus tachycardia with a rate of 125 bpm, normal PR interval, normal QRS, normal QT  ____________________________________________  ED COURSE:  As part of my medical decision making, I reviewed the following data within the electronic MEDICAL RECORD NUMBER History obtained from family if available, nursing notes, old chart and ekg, as well as notes from prior ED visits. Patient presented for weakness, we will assess with labs and imaging as indicated at this time.   Procedures ____________________________________________   LABS (pertinent positives/negatives)  Labs Reviewed  BASIC METABOLIC PANEL - Abnormal; Notable for the following components:      Result Value   Sodium 134 (*)    Glucose, Bld 184 (*)    BUN 35 (*)    Creatinine, Ser 1.52 (*)    GFR calc non Af Amer 43 (*)    GFR calc Af Amer 50 (*)    All other components within normal limits  CBC - Abnormal; Notable for the following components:   RBC 4.00 (*)    HCT 38.5 (*)    RDW 15.3 (*)    All other components within normal limits  URINALYSIS, COMPLETE (UACMP) WITH MICROSCOPIC - Abnormal; Notable for the following components:   Color, Urine AMBER (*)    APPearance HAZY (*)    Ketones, ur 5 (*)    Protein, ur 30 (*)    Bacteria, UA RARE (*)    All other components within normal limits  TROPONIN I - Abnormal; Notable for the following components:   Troponin I 0.03 (*)    All other components within normal limits  HEPATIC FUNCTION PANEL - Abnormal; Notable for the following components:   Albumin 3.4 (*)    Alkaline Phosphatase 26 (*)    Bilirubin, Direct 0.3 (*)    All other components within normal limits  CBG MONITORING, ED   ____________________________________________  DIFFERENTIAL DIAGNOSIS   Dehydration, electrolyte abnormality, hepatitis, renal failure, occult infection  FINAL ASSESSMENT AND PLAN  Weakness, nausea   Plan: The  patient had presented for weakness and nausea. Patient's labs did not reveal any acute process although he does appear dehydrated.  He feels better after fluids while in the ER, I have also advised stopping taking his doxycycline which is likely upsetting his stomach or causing diarrhea.  He is cleared for outpatient follow-up.   Ulice Dash, MD   Note: This note was generated in part or whole with voice recognition software. Voice recognition is usually quite accurate but there are transcription errors that can and very often do occur. I apologize for any typographical errors that were not detected and corrected.     Emily Filbert, MD 08/15/18 1444

## 2020-04-18 ENCOUNTER — Observation Stay: Payer: Medicare Other

## 2020-04-18 ENCOUNTER — Encounter: Payer: Self-pay | Admitting: Internal Medicine

## 2020-04-18 ENCOUNTER — Other Ambulatory Visit: Payer: Self-pay

## 2020-04-18 ENCOUNTER — Observation Stay
Admission: EM | Admit: 2020-04-18 | Discharge: 2020-04-19 | Disposition: A | Discharge status: Home / Self Care | Payer: Medicare Other | Attending: Internal Medicine | Admitting: Internal Medicine

## 2020-04-18 DIAGNOSIS — I10 Essential (primary) hypertension: Secondary | ICD-10-CM | POA: Diagnosis not present

## 2020-04-18 DIAGNOSIS — Z7952 Long term (current) use of systemic steroids: Secondary | ICD-10-CM | POA: Insufficient documentation

## 2020-04-18 DIAGNOSIS — Z7984 Long term (current) use of oral hypoglycemic drugs: Secondary | ICD-10-CM | POA: Insufficient documentation

## 2020-04-18 DIAGNOSIS — E871 Hypo-osmolality and hyponatremia: Secondary | ICD-10-CM | POA: Diagnosis present

## 2020-04-18 DIAGNOSIS — Z20822 Contact with and (suspected) exposure to covid-19: Secondary | ICD-10-CM | POA: Diagnosis not present

## 2020-04-18 DIAGNOSIS — W19XXXA Unspecified fall, initial encounter: Secondary | ICD-10-CM | POA: Diagnosis present

## 2020-04-18 DIAGNOSIS — Z9181 History of falling: Secondary | ICD-10-CM | POA: Insufficient documentation

## 2020-04-18 DIAGNOSIS — R778 Other specified abnormalities of plasma proteins: Secondary | ICD-10-CM | POA: Insufficient documentation

## 2020-04-18 DIAGNOSIS — R197 Diarrhea, unspecified: Secondary | ICD-10-CM | POA: Diagnosis not present

## 2020-04-18 DIAGNOSIS — Z79899 Other long term (current) drug therapy: Secondary | ICD-10-CM | POA: Insufficient documentation

## 2020-04-18 DIAGNOSIS — Z87891 Personal history of nicotine dependence: Secondary | ICD-10-CM | POA: Diagnosis not present

## 2020-04-18 DIAGNOSIS — E119 Type 2 diabetes mellitus without complications: Secondary | ICD-10-CM | POA: Diagnosis not present

## 2020-04-18 DIAGNOSIS — R0602 Shortness of breath: Secondary | ICD-10-CM | POA: Diagnosis not present

## 2020-04-18 DIAGNOSIS — R9431 Abnormal electrocardiogram [ECG] [EKG]: Secondary | ICD-10-CM | POA: Insufficient documentation

## 2020-04-18 DIAGNOSIS — E860 Dehydration: Secondary | ICD-10-CM | POA: Insufficient documentation

## 2020-04-18 DIAGNOSIS — R262 Difficulty in walking, not elsewhere classified: Secondary | ICD-10-CM | POA: Diagnosis not present

## 2020-04-18 DIAGNOSIS — Z888 Allergy status to other drugs, medicaments and biological substances status: Secondary | ICD-10-CM | POA: Diagnosis not present

## 2020-04-18 DIAGNOSIS — R531 Weakness: Secondary | ICD-10-CM | POA: Diagnosis present

## 2020-04-18 DIAGNOSIS — Z7982 Long term (current) use of aspirin: Secondary | ICD-10-CM | POA: Insufficient documentation

## 2020-04-18 DIAGNOSIS — Z882 Allergy status to sulfonamides status: Secondary | ICD-10-CM | POA: Diagnosis not present

## 2020-04-18 DIAGNOSIS — K219 Gastro-esophageal reflux disease without esophagitis: Secondary | ICD-10-CM | POA: Diagnosis not present

## 2020-04-18 DIAGNOSIS — R112 Nausea with vomiting, unspecified: Principal | ICD-10-CM | POA: Diagnosis present

## 2020-04-18 DIAGNOSIS — R05 Cough: Secondary | ICD-10-CM | POA: Diagnosis not present

## 2020-04-18 DIAGNOSIS — Z8249 Family history of ischemic heart disease and other diseases of the circulatory system: Secondary | ICD-10-CM | POA: Diagnosis not present

## 2020-04-18 DIAGNOSIS — Z881 Allergy status to other antibiotic agents status: Secondary | ICD-10-CM | POA: Insufficient documentation

## 2020-04-18 DIAGNOSIS — M3320 Polymyositis, organ involvement unspecified: Secondary | ICD-10-CM | POA: Diagnosis not present

## 2020-04-18 DIAGNOSIS — Z9079 Acquired absence of other genital organ(s): Secondary | ICD-10-CM | POA: Insufficient documentation

## 2020-04-18 DIAGNOSIS — R2681 Unsteadiness on feet: Secondary | ICD-10-CM | POA: Insufficient documentation

## 2020-04-18 DIAGNOSIS — R7989 Other specified abnormal findings of blood chemistry: Secondary | ICD-10-CM | POA: Diagnosis present

## 2020-04-18 DIAGNOSIS — J8410 Pulmonary fibrosis, unspecified: Secondary | ICD-10-CM | POA: Diagnosis present

## 2020-04-18 LAB — URINALYSIS, COMPLETE (UACMP) WITH MICROSCOPIC
Bacteria, UA: NONE SEEN
Bilirubin Urine: NEGATIVE
Glucose, UA: NEGATIVE mg/dL
Hgb urine dipstick: NEGATIVE
Leukocytes,Ua: NEGATIVE
Nitrite: NEGATIVE
Protein, ur: NEGATIVE mg/dL
RBC / HPF: NONE SEEN RBC/hpf (ref 0–5)
Specific Gravity, Urine: 1.02 (ref 1.005–1.030)
Squamous Epithelial / LPF: NONE SEEN (ref 0–5)
pH: 6.5 (ref 5.0–8.0)

## 2020-04-18 LAB — BASIC METABOLIC PANEL
Anion gap: 13 (ref 5–15)
Anion gap: 10 (ref 5–15)
Anion gap: 9 (ref 5–15)
BUN: 14 mg/dL (ref 8–23)
BUN: 12 mg/dL (ref 8–23)
BUN: 11 mg/dL (ref 8–23)
CO2: 26 mmol/L (ref 22–32)
CO2: 26 mmol/L (ref 22–32)
CO2: 26 mmol/L (ref 22–32)
Calcium: 8.8 mg/dL — ABNORMAL LOW (ref 8.9–10.3)
Calcium: 8.1 mg/dL — ABNORMAL LOW (ref 8.9–10.3)
Calcium: 8.3 mg/dL — ABNORMAL LOW (ref 8.9–10.3)
Chloride: 85 mmol/L — ABNORMAL LOW (ref 98–111)
Chloride: 91 mmol/L — ABNORMAL LOW (ref 98–111)
Chloride: 91 mmol/L — ABNORMAL LOW (ref 98–111)
Creatinine, Ser: 1.09 mg/dL (ref 0.61–1.24)
Creatinine, Ser: 0.83 mg/dL (ref 0.61–1.24)
Creatinine, Ser: 0.95 mg/dL (ref 0.61–1.24)
GFR calc Af Amer: >60 mL/min (ref >60)
GFR calc Af Amer: >60 mL/min (ref >60)
GFR calc Af Amer: >60 mL/min (ref >60)
GFR calc non Af Amer: >60 mL/min (ref >60)
GFR calc non Af Amer: >60 mL/min (ref >60)
GFR calc non Af Amer: >60 mL/min (ref >60)
Glucose, Bld: 132 mg/dL — ABNORMAL HIGH (ref 70–99)
Glucose, Bld: 105 mg/dL — ABNORMAL HIGH (ref 70–99)
Glucose, Bld: 143 mg/dL — ABNORMAL HIGH (ref 70–99)
Potassium: 4.2 mmol/L (ref 3.5–5.1)
Potassium: 3.7 mmol/L (ref 3.5–5.1)
Potassium: 4.2 mmol/L (ref 3.5–5.1)
Sodium: 124 mmol/L — ABNORMAL LOW (ref 135–145)
Sodium: 127 mmol/L — ABNORMAL LOW (ref 135–145)
Sodium: 126 mmol/L — ABNORMAL LOW (ref 135–145)

## 2020-04-18 LAB — CBC
HCT: 32.4 % — ABNORMAL LOW (ref 39.0–52.0)
Hemoglobin: 11.5 g/dL — ABNORMAL LOW (ref 13.0–17.0)
MCH: 33.2 pg (ref 26.0–34.0)
MCHC: 35.5 g/dL (ref 30.0–36.0)
MCV: 93.6 fL (ref 80.0–100.0)
Platelets: 252 10*3/uL (ref 150–400)
RBC: 3.46 MIL/uL — ABNORMAL LOW (ref 4.22–5.81)
RDW: 14.6 % (ref 11.5–15.5)
WBC: 6.9 10*3/uL (ref 4.0–10.5)
nRBC: 0 % (ref 0.0–0.2)

## 2020-04-18 LAB — HEPATIC FUNCTION PANEL
ALT: 24 U/L (ref 0–44)
AST: 26 U/L (ref 15–41)
Albumin: 3.3 g/dL — ABNORMAL LOW (ref 3.5–5.0)
Alkaline Phosphatase: 25 U/L — ABNORMAL LOW (ref 38–126)
Bilirubin, Direct: 0.2 mg/dL (ref 0.0–0.2)
Indirect Bilirubin: 0.8 mg/dL (ref 0.3–0.9)
Total Bilirubin: 1 mg/dL (ref 0.3–1.2)
Total Protein: 6.8 g/dL (ref 6.5–8.1)

## 2020-04-18 LAB — OSMOLALITY: Osmolality: 264 mOsm/kg — ABNORMAL LOW (ref 275–295)

## 2020-04-18 LAB — HEMOGLOBIN A1C
Hgb A1c MFr Bld: 6.4 % — ABNORMAL HIGH (ref 4.8–5.6)
Mean Plasma Glucose: 136.98 mg/dL

## 2020-04-18 LAB — TROPONIN I (HIGH SENSITIVITY)
Troponin I (High Sensitivity): 64 ng/L — ABNORMAL HIGH (ref <18)
Troponin I (High Sensitivity): 76 ng/L — ABNORMAL HIGH (ref <18)
Troponin I (High Sensitivity): 79 ng/L — ABNORMAL HIGH (ref <18)
Troponin I (High Sensitivity): 88 ng/L — ABNORMAL HIGH (ref <18)

## 2020-04-18 LAB — SODIUM, URINE, RANDOM: Sodium, Ur: 85 mmol/L

## 2020-04-18 LAB — SARS CORONAVIRUS 2 (TAT 6-24 HRS): SARS Coronavirus 2: NEGATIVE

## 2020-04-18 LAB — LACTIC ACID, PLASMA
Lactic Acid, Venous: 2.4 mmol/L (ref 0.5–1.9)
Lactic Acid, Venous: 1.5 mmol/L (ref 0.5–1.9)

## 2020-04-18 LAB — GLUCOSE, CAPILLARY
Glucose-Capillary: 130 mg/dL — ABNORMAL HIGH (ref 70–99)
Glucose-Capillary: 170 mg/dL — ABNORMAL HIGH (ref 70–99)

## 2020-04-18 LAB — OSMOLALITY, URINE: Osmolality, Ur: 490 mOsm/kg (ref 300–900)

## 2020-04-18 MED ORDER — ONDANSETRON HCL 4 MG/2ML IJ SOLN: 4.0000 mg | Freq: Three times a day (TID) | INTRAMUSCULAR | Status: DC | PRN

## 2020-04-18 MED ORDER — VITAMIN D 25 MCG (1000 UNIT) PO TABS
1000.0000 [IU] | ORAL_TABLET | Freq: Every day | ORAL | Status: DC
  Administered 2020-04-19: 11:00:00 1000 [IU] via ORAL
  Filled 2020-04-18: qty 1

## 2020-04-18 MED ORDER — DM-GUAIFENESIN ER 30-600 MG PO TB12: 1.0000 | ORAL_TABLET | Freq: Two times a day (BID) | ORAL | Status: DC | PRN

## 2020-04-18 MED ORDER — PANTOPRAZOLE SODIUM 20 MG PO TBEC
20.0000 mg | DELAYED_RELEASE_TABLET | Freq: Every day | ORAL | Status: DC
  Administered 2020-04-19: 20 mg via ORAL
  Filled 2020-04-18: qty 1

## 2020-04-18 MED ORDER — PREDNISONE 10 MG PO TABS: 10.0000 mg | ORAL_TABLET | Freq: Every day | ORAL | Status: DC

## 2020-04-18 MED ORDER — OMEGA-3-ACID ETHYL ESTERS 1 G PO CAPS
1.0000 g | ORAL_CAPSULE | Freq: Every day | ORAL | Status: DC
  Administered 2020-04-19: 11:00:00 1 g via ORAL
  Filled 2020-04-18: qty 1

## 2020-04-18 MED ORDER — AZATHIOPRINE 50 MG PO TABS
200.0000 mg | ORAL_TABLET | Freq: Every day | ORAL | Status: DC
  Filled 2020-04-18: qty 4

## 2020-04-18 MED ORDER — HYDRALAZINE HCL 20 MG/ML IJ SOLN: 5.0000 mg | INTRAMUSCULAR | Status: DC | PRN

## 2020-04-18 MED ORDER — SODIUM CHLORIDE 0.9 % IV BOLUS
1000.0000 mL | Freq: Once | INTRAVENOUS | Status: AC
  Administered 2020-04-18: 1000 mL via INTRAVENOUS

## 2020-04-18 MED ORDER — CALCIUM CARBONATE-VITAMIN D 500-200 MG-UNIT PO TABS
1.0000 | ORAL_TABLET | Freq: Every day | ORAL | Status: DC
  Administered 2020-04-19: 1 via ORAL
  Filled 2020-04-18: qty 1

## 2020-04-18 MED ORDER — PREDNISONE 10 MG PO TABS
5.0000 mg | ORAL_TABLET | Freq: Every day | ORAL | Status: DC
  Administered 2020-04-19: 11:00:00 5 mg via ORAL
  Filled 2020-04-18: qty 1

## 2020-04-18 MED ORDER — TAMSULOSIN HCL 0.4 MG PO CAPS
0.4000 mg | ORAL_CAPSULE | Freq: Every day | ORAL | Status: DC
  Administered 2020-04-18: 0.4 mg via ORAL
  Filled 2020-04-18 (×2): qty 1

## 2020-04-18 MED ORDER — ACETAMINOPHEN 325 MG PO TABS: 650.0000 mg | ORAL_TABLET | Freq: Four times a day (QID) | ORAL | Status: DC | PRN

## 2020-04-18 MED ORDER — HYDROCORTISONE NA SUCCINATE PF 100 MG IJ SOLR
50.0000 mg | Freq: Once | INTRAMUSCULAR | Status: AC
  Administered 2020-04-18: 16:00:00 50 mg via INTRAVENOUS
  Filled 2020-04-18: qty 1

## 2020-04-18 MED ORDER — ALBUTEROL SULFATE (2.5 MG/3ML) 0.083% IN NEBU: 2.5000 mg | INHALATION_SOLUTION | RESPIRATORY_TRACT | Status: DC | PRN

## 2020-04-18 MED ORDER — SODIUM CHLORIDE 0.9 % IV SOLN: Freq: Once | INTRAVENOUS | Status: AC

## 2020-04-18 MED ORDER — AZATHIOPRINE 50 MG PO TABS
125.0000 mg | ORAL_TABLET | Freq: Every day | ORAL | Status: DC
  Administered 2020-04-18 – 2020-04-19 (×2): 125 mg via ORAL
  Filled 2020-04-18 (×2): qty 3

## 2020-04-18 MED ORDER — ENOXAPARIN SODIUM 40 MG/0.4ML ~~LOC~~ SOLN
40.0000 mg | SUBCUTANEOUS | Status: DC
  Administered 2020-04-18: 22:00:00 40 mg via SUBCUTANEOUS
  Filled 2020-04-18: qty 0.4

## 2020-04-18 MED ORDER — INSULIN ASPART 100 UNIT/ML ~~LOC~~ SOLN
0.0000 [IU] | SUBCUTANEOUS | Status: DC
  Administered 2020-04-18 (×2): 2 [IU] via SUBCUTANEOUS
  Filled 2020-04-18 (×2): qty 1

## 2020-04-18 MED ORDER — ALBUTEROL SULFATE HFA 108 (90 BASE) MCG/ACT IN AERS: 2.0000 | INHALATION_SPRAY | RESPIRATORY_TRACT | Status: DC | PRN

## 2020-04-18 MED ORDER — ASPIRIN EC 81 MG PO TBEC
81.0000 mg | DELAYED_RELEASE_TABLET | Freq: Every day | ORAL | Status: DC
  Administered 2020-04-18 – 2020-04-19 (×2): 81 mg via ORAL
  Filled 2020-04-18 (×2): qty 1

## 2020-04-18 MED ORDER — SODIUM CHLORIDE 0.9 % IV SOLN: INTRAVENOUS | Status: DC

## 2020-04-18 NOTE — ED Notes (Signed)
Pt to ED bed 16 via WC from triage.  C/O weakness and nausea/vomitting x3 days.  Pt also had witness falled at home 2 days ago hitting his face/head on chair. Denies LOC, states "I just got weak and fell"  No neuro deficits notes, denies HA. Bruising noted to left face and eye.  GCS 15, rr even/unlabored at an increased rate of 22/min, clear bilaterally, abd soft/flat/nontender with umbilical hernia noted.  Skin warm/pale/dry.  VSS Dr. Marisa Severin at bedside for assessment.  Will continue to moniotor/reasess.

## 2020-04-18 NOTE — Evaluation (Signed)
Occupational Therapy Evaluation Patient Details Name: Lee Johnson MRN: [id number] DOB: 12-26-1940 Today's Date: 04/18/2020    History of Present Illness Per MD note:Lee Johnson is a 79 y.o. male with PMH as noted below including polymyositis, interstitial lung disease, and antisynthetase syndrome who presents with generalized weakness over the last several days, preceded by diarrhea, and associated with some vomiting as well.  The patient states that he was started on an antibiotic (Augmentin) for a sinus infection about 4 days ago, took 1 dose, and then immediately started having diarrhea and nausea.  He last had diarrhea 2 days ago.  He has had some nausea and vomiting until this morning.  He denies any abdominal pain, or other acute pain.  At this time, he mainly reports generalized weakness.  In addition he fell 2 days ago because he felt weak, and hit his head.   Clinical Impression   Mr Schrecengost was seen for OT evaluation this date. Prior to hospital admission, pt was MOD I for ADLs and mobility using RW c assist from wife for IADLs. Pt lives in a 1 story home c 2 steps to enter and endorses a history of ~4 falls I last 4 months. Pt presents to acute OT demonstrating impaired ADL performance and functional mobility 2/2 decreased safety awareness, functional strength/balance deficits, and decreased activity tolerance. Pt currently requires MIN A + RW simulated BSC t/f c MIN VCs for safe technique (assist for RW stabilization and controlled descent). MOD I grooming and self-feeding seated EOB. Pt would benefit from skilled OT to address noted impairments and functional limitations (see below for any additional details) in order to maximize safety and independence while minimizing falls risk and caregiver burden. Upon hospital discharge, recommend HHOT to maximize pt safety and return to functional independence during meaningful occupations of daily life.     Follow Up Recommendations  Home  health OT;Supervision/Assistance - 24 hour    Equipment Recommendations       Recommendations for Other Services       Precautions / Restrictions Precautions Precautions: Fall Restrictions Weight Bearing Restrictions: No      Mobility Bed Mobility Overal bed mobility: Needs Assistance Bed Mobility: Supine to Sit;Sit to Supine     Supine to sit: HOB elevated;Supervision Sit to supine: Supervision;HOB elevated      Transfers Overall transfer level: Needs assistance Equipment used: Rolling walker (2 wheeled) Transfers: Sit to/from Stand Sit to Stand: Min assist         General transfer comment: Assist to stabilize RW sit>stand and for controlled descent stand>sit    Balance Overall balance assessment: Needs assistance Sitting-balance support: Feet supported Sitting balance-Leahy Scale: Good     Standing balance support: Bilateral upper extremity supported Standing balance-Leahy Scale: Fair                             ADL either performed or assessed with clinical judgement   ADL Overall ADL's : Needs assistance/impaired                                       General ADL Comments: MIN A + RW simulated BSC t/f c MIN VCs for safe technique. MOD I grooming and self-feeding seated EOB.      Vision         Perception     Praxis  Pertinent Vitals/Pain Pain Assessment: No/denies pain     Hand Dominance Right   Extremity/Trunk Assessment Upper Extremity Assessment Upper Extremity Assessment: Generalized weakness   Lower Extremity Assessment Lower Extremity Assessment: Generalized weakness   Cervical / Trunk Assessment Cervical / Trunk Assessment: Normal   Communication Communication Communication: No difficulties   Cognition Arousal/Alertness: Awake/alert Behavior During Therapy: WFL for tasks assessed/performed Overall Cognitive Status: Within Functional Limits for tasks assessed                                      General Comments  SpO2 stable on 2L [place]. RR increased to 37 and HR 117 during standing trial, pt reports this is not unusual - resolved quickly c rest    Exercises Exercises: Other exercises Other Exercises Other Exercises: Pt educated re: OT role, DME recommendations, d/c recommendations, falls prevention, energy conservation, safe t/f and RW technique  Other Exercises: LBD, bed mobility, sit<>stand, sup<>sit, simulated grooming tasks, sitting/standing balance/tolerance   Shoulder Instructions      Home Living Family/patient expects to be discharged to:: Private residence Living Arrangements: Spouse/significant other Available Help at Discharge: Family;Available 24 hours/day Type of Home: House Home Access: Stairs to enter [employer] of Steps: 2 Entrance Stairs-Rails: Right;Left;Can reach both Home Layout: One level     Bathroom Shower/Tub: Walk-in shower         Home Equipment: Emergency planning/management officer - 2 wheels;Cane - single point          Prior Functioning/Environment Level of Independence: Independent with assistive device(s)        Comments: Pt reports wife assists for IADLs and uses RW for mobility. Pt endorses 4 falls in past 4 months.        OT Problem List: Decreased strength;Decreased range of motion;Decreased activity tolerance;Impaired balance (sitting and/or standing);Decreased knowledge of use of DME or AE      OT Treatment/Interventions: Self-care/ADL training;Therapeutic exercise;Neuromuscular education;Energy conservation;DME and/or AE instruction;Therapeutic activities;Patient/family education;Balance training    OT Goals(Current goals can be found in the care plan section) Acute Rehab OT Goals Patient Stated Goal: To return home OT Goal Formulation: With patient Time For Goal Achievement: 05/02/20 Potential to Achieve Goals: Good ADL Goals Pt Will Perform Grooming: standing;with supervision(c LRAD PRN) Pt Will  Perform Lower Body Dressing: with modified independence;sit to/from stand(c LRAD PRN) Pt Will Transfer to Toilet: with modified independence;ambulating;regular height toilet(c LRAD PRN) Additional ADL Goal #1: Pt will independently verbalize plan to implement x3 falls prevention strategies  OT Frequency: Min 2X/week   Barriers to D/C: Inaccessible home environment          Co-evaluation              AM-PAC OT "6 Clicks" Daily Activity     Outcome Measure Help from another person eating meals?: None Help from another person taking care of personal grooming?: None Help from another person toileting, which includes using toliet, bedpan, or urinal?: A Little Help from another person bathing (including washing, rinsing, drying)?: A Little Help from another person to put on and taking off regular upper body clothing?: None Help from another person to put on and taking off regular lower body clothing?: A Little 6 Click Score: 21   End of Session Equipment Utilized During Treatment: Rolling walker;Oxygen(2L [place])  Activity Tolerance: Patient tolerated treatment well Patient left: in bed;with call bell/phone within reach  OT Visit Diagnosis: Unsteadiness on  feet (R26.81);Other abnormalities of gait and mobility (R26.89);History of falling (Z91.81)                Time: 8676-1950 OT Time Calculation (min): 17 min Charges:  OT General Charges $OT Visit: 1 Visit OT Evaluation $OT Eval Low Complexity: 1 Low OT Treatments $Self Care/Home Management : 8-22 mins  Kathie Dike, M.S. OTR/L  04/18/20, 5:03 PM

## 2020-04-18 NOTE — H&P (Signed)
History and Physical    Lee Johnson XBM:[id number] DOB: 05/12/1941 DOA: 04/18/2020  Referring MD/NP/PA:   PCP: Valera Castle, MD   Patient coming from:  The patient is coming from home.  At baseline, pt is independent for most of ADL.        Chief Complaint: Generalized weakness, fall, diarrhea  HPI: Lee Johnson is a 79 y.o. male with medical history significant of hypertension, diabetes mellitus, GERD, polymyositis, pulmonary fibrosis, who presents with generalized weakness, fall and diarrhea.  Pt states that he started having diarrhea after he took 1 pill of Augmentin for possible sinus infection on Wednesday.  He had at least 3 times of watery diarrhea on Wednesday, which has been improving.  Patient has nausea and vomited several times with nonbilious nonbloody vomitus. No abdominal pain.  No fever or chills.  He has generalized weakness, and felt on Friday.  Injured her left side of head. He has bruise around his left yes.  No headache or neck pain.  Patient states he has chronic cough and mild shortness breath, which has not changed.  No chest pain.  Denies symptoms of UTI.  No unilateral numbness or tingling extremities been no facial droop or slurred speech.  ED Course: pt was found to have WBC 6.9, lactic acid 2.4, 1.5, troponin 64, 76, negative urinalysis, pending COVID-19 PCR, sodium 124, renal function okay, temperature normal, blood pressure 154/85, tachycardia, tachypnea, oxygen saturation 95% on room air.  Patient is placed to MedSurg bed for observation  Review of Systems:   General: no fevers, chills, no body weight gain, has poor appetite, has fatigue HEENT: no blurry vision, hearing changes or sore throat Respiratory: has dyspnea, coughing, no wheezing CV: no chest pain, no palpitations GI: no nausea, vomiting, abdominal pain, diarrhea, constipation GU: no dysuria, burning on urination, increased urinary frequency, hematuria  Ext: no leg edema Neuro:  no unilateral weakness, numbness, or tingling, no vision change or hearing loss Skin: no rash. Has bruise around the left eye MSK: No muscle spasm, no deformity, no limitation of range of movement in spin Heme: No easy bruising.  Travel history: No recent long distant travel.  Allergy:  Allergies  Allergen Reactions  . Atorvastatin     Other reaction(s): Muscle Pain Other reaction(s): Muscle Pain Other reaction(s): Muscle Pain Other reaction(s): Muscle Pain  . Statins Other (See Comments)    Polymyositis. 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    . Montelukast     Other reaction(s): Other (See Comments)  . Montelukast Sodium     Other reaction(s): Unknown  . Other Diarrhea  . Sulfa Antibiotics Diarrhea  . Sulfamethoxazole-Trimethoprim Diarrhea    Past Medical History:  Diagnosis Date  . Diabetes mellitus without complication ([place])   . Hypertension   . Polymyositis ([place])   . Pulmonary fibrosis ([place])     Past Surgical History:  Procedure Laterality Date  . COLONOSCOPY WITH PROPOFOL N/A 05/25/2016   Procedure: COLONOSCOPY WITH PROPOFOL;  Surgeon: Lucilla Lame, MD;  Location: [facility] ENDOSCOPY;  Service: Endoscopy;  Laterality: N/A;  . ESOPHAGOGASTRODUODENOSCOPY (EGD) WITH PROPOFOL N/A 05/25/2016   Procedure: ESOPHAGOGASTRODUODENOSCOPY (EGD) WITH PROPOFOL;  Surgeon: Lucilla Lame, MD;  Location: [facility] ENDOSCOPY;  Service: Endoscopy;  Laterality: N/A;  . EYE SURGERY    . HERNIA REPAIR    . TESTICLE REMOVAL      Social History:  reports that he has quit smoking. He has never used smokeless tobacco. He reports that he does not drink  alcohol or use drugs.  Family History:  Family History  Problem Relation Age of Onset  . CAD Mother      Prior to Admission medications   Medication Sig Start Date End Date Taking? Authorizing Provider  Acetaminophen 500 MG coapsule Take 1,000 mg by mouth daily. Pt also takes PRN    [provider]  alendronate (FOSAMAX) 70 MG tablet Take 70 mg by mouth  once a week. 04/14/20   [provider]  aspirin EC 81 MG tablet Take 81 mg by mouth daily.     [provider]  azaTHIOprine (IMURAN) 50 MG tablet Take 100 mg by mouth daily. 02/13/20   [provider]  betamethasone valerate lotion (VALISONE) 0.1 % Apply 1 application topically as needed. apply to face 10/07/13   [provider]  Calcium Carbonate-Vitamin D 600-400 MG-UNIT tablet Take 1 tablet by mouth daily.     [provider]  Cholecalciferol (VITAMIN D-1000 MAX ST) 1000 units tablet Take 1,000 Units by mouth daily.     [provider]  enalapril-hydrochlorothiazide (VASERETIC) 10-25 MG tablet Take 0.5 tablets by mouth daily.  12/06/15 12/05/16  [provider]  esomeprazole (NEXIUM) 20 MG capsule Take 20 mg by mouth daily.     [provider]  Flaxseed, Linseed, (FLAXSEED OIL) 1000 MG CAPS Take 1 capsule by mouth 2 (two) times daily.     [provider]  glipiZIDE (GLUCOTROL XL) 10 MG 24 hr tablet Take 10 mg by mouth daily after supper.  05/18/16   [provider]  glipiZIDE (GLUCOTROL) 10 MG tablet Take 10 mg by mouth daily.  05/05/16 05/05/17  [provider]  glucose (SUNMARK GLUCOSE) 4 GM chewable tablet Chew 1 tablet by mouth as needed. For low blood sugar 03/21/16 03/21/17  [provider]  metFORMIN (GLUCOPHAGE-XR) 500 MG 24 hr tablet Take 2 tablets by mouth 2 (two) times daily after a meal. 12/06/15 12/05/16  [provider]  Omega-3 Fatty Acids (FISH OIL) 1000 MG CAPS Take 1 capsule by mouth 2 (two) times daily.    [provider]  OXYGEN Place 2 L into the nose continuous. Oxygen per nasal cannula continuous    [provider]  predniSONE (DELTASONE) 5 MG tablet Take 4 tablets (20 mg total) by mouth daily. 05/27/16   Enedina Finner, MD  tamsulosin (FLOMAX) 0.4 MG CAPS capsule Take 0.4 mg by mouth daily. 04/08/20   [provider]    Physical  Exam: Vitals:   04/18/20 0834 04/18/20 0900 04/18/20 1000 04/18/20 1100  BP:  (!) 154/85 126/82 (!) 149/121  Pulse:  90 88 100  Resp:  (!) 22 19 (!) 22  Temp:      TempSrc:      SpO2: 97% 97% 96% 96%  Weight:      Height:       General: Not in acute distress HEENT:  Has bruise around the left eye       Eyes: PERRL, EOMI, no scleral icterus.       ENT: No discharge from the ears and nose, no pharynx injection, no tonsillar enlargement.        Neck: No JVD, no bruit, no mass felt. Heme: No neck lymph node enlargement. Cardiac: S1/S2, RRR, No murmurs, No gallops or rubs. Respiratory: No rales, wheezing, rhonchi or rubs. GI: Soft, nondistended, nontender, no rebound pain, no organomegaly, BS present. GU: No hematuria Ext: No pitting leg edema bilaterally. 2+DP/PT pulse bilaterally. Musculoskeletal:  No joint deformities, No joint redness or warmth, no limitation of ROM in spin. Skin: No rashes.  Neuro: Alert, oriented X3, cranial nerves II-XII grossly intact, moves all extremities. Psych: Patient is not psychotic, no suicidal or hemocidal ideation.  Labs on Admission: I have personally reviewed following labs and imaging studies  CBC: Recent Labs  Lab 04/18/20 0812  WBC 6.9  HGB 11.5*  HCT 32.4*  MCV 93.6  PLT 252   Basic Metabolic Panel: Recent Labs  Lab 04/18/20 0812  NA 124*  K 4.2  CL 85*  CO2 26  GLUCOSE 132*  BUN 14  CREATININE 1.09  CALCIUM 8.8*   GFR: Estimated Creatinine Clearance: 55.9 mL/min (by C-G formula based on SCr of 1.09 mg/dL). Liver Function Tests: Recent Labs  Lab 04/18/20 0812  AST 26  ALT 24  ALKPHOS 25*  BILITOT 1.0  PROT 6.8  ALBUMIN 3.3*   No results for input(s): LIPASE, AMYLASE in the last 168 hours. No results for input(s): AMMONIA in the last 168 hours. Coagulation Profile: No results for input(s): INR, PROTIME in the last 168 hours. Cardiac Enzymes: No results for input(s): CKTOTAL, CKMB, CKMBINDEX, TROPONINI in the last  168 hours. BNP (last 3 results) No results for input(s): PROBNP in the last 8760 hours. HbA1C: No results for input(s): HGBA1C in the last 72 hours. CBG: No results for input(s): GLUCAP in the last 168 hours. Lipid Profile: No results for input(s): CHOL, HDL, LDLCALC, TRIG, CHOLHDL, LDLDIRECT in the last 72 hours. Thyroid Function Tests: No results for input(s): TSH, T4TOTAL, FREET4, T3FREE, THYROIDAB in the last 72 hours. Anemia Panel: No results for input(s): VITAMINB12, FOLATE, FERRITIN, TIBC, IRON, RETICCTPCT in the last 72 hours. Urine analysis:    Component Value Date/Time   COLORURINE YELLOW 04/18/2020 0910   APPEARANCEUR CLEAR 04/18/2020 0910   LABSPEC 1.020 04/18/2020 0910   PHURINE 6.5 04/18/2020 0910   GLUCOSEU NEGATIVE 04/18/2020 0910   HGBUR NEGATIVE 04/18/2020 0910   BILIRUBINUR NEGATIVE 04/18/2020 0910   KETONESUR TRACE (A) 04/18/2020 0910   PROTEINUR NEGATIVE 04/18/2020 0910   NITRITE NEGATIVE 04/18/2020 0910   LEUKOCYTESUR NEGATIVE 04/18/2020 0910   Sepsis Labs: @LABRCNTIP (procalcitonin:4,lacticidven:4) )No results found for this or any previous visit (from the past 240 hour(s)).   Radiological Exams on Admission: No results found.   EKG: Independently reviewed.  Sinus rhythm, tachycardia, QTC 447, borderline LAD, poor R wave progression  Assessment/Plan Principal Problem:   Nausea vomiting and diarrhea Active Problems:   Generalized weakness   Fall   Hyponatremia   Elevated troponin   Elevated lactic acid level   Diabetes mellitus without complication (HCC)   Hypertension   Polymyositis (HCC)   Pulmonary fibrosis (HCC)   Nausea vomiting and diarrhea: Etiology is not clear.  Patient does not have abdominal pain.  Symptoms started after taking 1 pill of Augmentin.  May be due to the side effect of Augmentin.  Differential diagnosis include viral gastroenteritis versus C. difficile colitis.  -Placed on MedSurg for obs -Check C. difficile PCR and  GI pathogen panel -IV fluid: 1 L normal saline, followed by 75 cc/h -As needed Zofran for nausea vomiting  Generalized weakness and Fall: Most likely due to multifactorial etiology, including hyponatremia, nausea, vomiting, diarrhea. -PT/OT -Follow-up CT of head  Hyponatremia: Most likely due to GI loss, poor oral intake and dehydration. - Will check urine sodium, urine osmolality, serum osmolality. - check TSH - IVF: 1L NS in ED, will continue with IV normal saline  at 75 mL/h - f/u by BMP q8h - avoid over correction too fast due to risk of central pontine myelinolysis - hold enalapril-HCTZ  Elevated troponin: Troponin 64 --> 76. No CP.  Likely due to demand ischemia. -Aspirin -Check A1c, FLP -Trend troponin -Repeat EKG in morning  Elevated lactic acid level: Already normalized with IV fluid.  2.4 --> 1.5.  Most likely due to dehydration -IV fluid as above  Diabetes mellitus without complication (HCC): Most recent A1c 6.4, well controled. Patient is taking Metformin, glipizide at home -SSI  HTN:  -hold enalapril-HCTZ due to hyponatremia -hydralazine prn  Polymyositis (HCC): -continue Imran -Increase home prednisone dose from 5 to 10 mg daily -Give 50 mg of Solu-Cortef as stress dose today  Pulmonary fibrosis (HCC): -As needed albuterol and Mucinex for cough    DVT ppx:   SQ Lovenox Code Status: Partial code (I discussed with the patient in the presence of his wife, and explained the meaning of CODE STATUS, patient wants to be partial code, OK for CPR, but no intubation). Family Communication: Yes, patient's wife   at bed side Disposition Plan:  Anticipate discharge back to previous home environment Consults called:  none Admission status: Med-surg bed for obs  Status is: Observation The patient remains OBS appropriate and will d/c before 2 midnights. Dispo: The patient is from: Home              Anticipated d/c is to: Home              Anticipated d/c date is: 1  day              Patient currently is not medically stable to d/c.          Date of Service 04/18/2020    Lorretta Harp [facility]   If 7PM-7AM, please contact night-coverage [email or web address] 04/18/2020, 11:43 AM

## 2020-04-18 NOTE — Progress Notes (Signed)
PT Cancellation Note  Patient Details Name: Lee Johnson MRN: [id number] DOB: Apr 24, 1941   Cancelled Treatment:    Reason Eval/Treat Not Completed: Medical issues which prohibited therapy, Nsg is in room taking patient to the restroom and he is having episode of  O2 desaturation and nsg advises to hold PT and recheck tomorrow.    [address], [place] DPT 04/18/2020, 2:46 PM

## 2020-04-18 NOTE — ED Notes (Signed)
Pt and wife updated on plan of care/verbalized understanding.  Pt refusing CT head at this time/  No acute changes noted.  GCS 15, rr even/unlabored, +PMSC x4, skin now warm/pink and dry.  VSS will continue to monitor/reassess.

## 2020-04-18 NOTE — ED Provider Notes (Signed)
[facility] Emergency Department Provider Note ____________________________________________   First MD Initiated Contact with Patient 04/18/20 0825     (approximate)  I have reviewed the triage vital signs and the nursing notes.   HISTORY  Chief Complaint Weakness    HPI Lee Johnson is a 79 y.o. male with PMH as noted below including polymyositis, interstitial lung disease, and antisynthetase syndrome who presents with generalized weakness over the last several days, preceded by diarrhea, and associated with some vomiting as well.  The patient states that he was started on an antibiotic (Augmentin) for a sinus infection about 4 days ago, took 1 dose, and then immediately started having diarrhea and nausea.  He last had diarrhea 2 days ago.  He has had some nausea and vomiting until this morning.  He denies any abdominal pain, or other acute pain.  At this time, he mainly reports generalized weakness.  In addition he fell 2 days ago because he felt weak, and hit his head.  Past Medical History:  Diagnosis Date  . Diabetes mellitus without complication (HCC)   . Hypertension   . Polymyositis (HCC)   . Pulmonary fibrosis [facility])     Patient Active Problem List   Diagnosis Date Noted  . Diarrhea 04/18/2020  . Generalized weakness 04/18/2020  . Fall 04/18/2020  . Hyponatremia 04/18/2020  . Elevated troponin 04/18/2020  . Acute posthemorrhagic anemia   . Gastritis   . Second degree hemorrhoids   . Diverticulosis of large intestine without diverticulitis   . Acute hepatitis   . Jaundice 05/18/2016    Past Surgical History:  Procedure Laterality Date  . COLONOSCOPY WITH PROPOFOL N/A 05/25/2016   Procedure: COLONOSCOPY WITH PROPOFOL;  Surgeon: Midge Minium, MD;  Location: [facility] ENDOSCOPY;  Service: Endoscopy;  Laterality: N/A;  . ESOPHAGOGASTRODUODENOSCOPY (EGD) WITH PROPOFOL N/A 05/25/2016   Procedure: ESOPHAGOGASTRODUODENOSCOPY (EGD) WITH PROPOFOL;   Surgeon: Midge Minium, MD;  Location: [facility] ENDOSCOPY;  Service: Endoscopy;  Laterality: N/A;  . EYE SURGERY    . HERNIA REPAIR    . TESTICLE REMOVAL      Prior to Admission medications   Medication Sig Start Date End Date Taking? Authorizing Provider  Acetaminophen 500 MG coapsule Take 1,000 mg by mouth daily. Pt also takes PRN    [provider]  alendronate (FOSAMAX) 70 MG tablet Take 70 mg by mouth once a week. 04/14/20   [provider]  aspirin EC 81 MG tablet Take 81 mg by mouth daily.     [provider]  azaTHIOprine (IMURAN) 50 MG tablet Take 100 mg by mouth daily. 02/13/20   [provider]  betamethasone valerate lotion (VALISONE) 0.1 % Apply 1 application topically as needed. apply to face 10/07/13   [provider]  Calcium Carbonate-Vitamin D 600-400 MG-UNIT tablet Take 1 tablet by mouth daily.     [provider]  Cholecalciferol (VITAMIN D-1000 MAX ST) 1000 units tablet Take 1,000 Units by mouth daily.     [provider]  enalapril-hydrochlorothiazide (VASERETIC) 10-25 MG tablet Take 0.5 tablets by mouth daily.  12/06/15 12/05/16  [provider]  esomeprazole (NEXIUM) 20 MG capsule Take 20 mg by mouth daily.     [provider]  Flaxseed, Linseed, (FLAXSEED OIL) 1000 MG CAPS Take 1 capsule by mouth 2 (two) times daily.     [provider]  glipiZIDE (GLUCOTROL XL) 10 MG 24 hr tablet Take 10 mg by mouth daily after supper.  05/18/16  [provider]  glipiZIDE (GLUCOTROL) 10 MG tablet Take 10 mg by mouth daily.  05/05/16 05/05/17  [provider]  glucose (SUNMARK GLUCOSE) 4 GM chewable tablet Chew 1 tablet by mouth as needed. For low blood sugar 03/21/16 03/21/17  [provider]  metFORMIN (GLUCOPHAGE-XR) 500 MG 24 hr tablet Take 2 tablets by mouth 2 (two) times daily after a meal. 12/06/15 12/05/16  [provider]  Omega-3 Fatty Acids (FISH OIL) 1000 MG  CAPS Take 1 capsule by mouth 2 (two) times daily.    [provider]  OXYGEN Place 2 L into the nose continuous. Oxygen per nasal cannula continuous    [provider]  predniSONE (DELTASONE) 5 MG tablet Take 4 tablets (20 mg total) by mouth daily. 05/27/16   Enedina Finner, MD  tamsulosin (FLOMAX) 0.4 MG CAPS capsule Take 0.4 mg by mouth daily. 04/08/20   [provider]    Allergies Atorvastatin, Statins, Montelukast, Montelukast sodium, Other, Sulfa antibiotics, and Sulfamethoxazole-trimethoprim  Family History  Problem Relation Age of Onset  . CAD Mother     Social History Social History   Tobacco Use  . Smoking status: Former Games developer  . Smokeless tobacco: Never Used  Substance Use Topics  . Alcohol use: No  . Drug use: No    Review of Systems  Constitutional: No fever.  Positive for generalized weakness. Eyes: No redness. ENT: No neck pain. Cardiovascular: Denies chest pain. Respiratory: Denies shortness of breath. Gastrointestinal: Positive for resolved vomiting and diarrhea. Genitourinary: Negative for dysuria.  Musculoskeletal: Negative for back pain. Skin: Negative for rash. Neurological: Negative for headache.   ____________________________________________   PHYSICAL EXAM:  VITAL SIGNS: ED Triage Vitals  Enc Vitals Group     BP 04/18/20 0812 (!) 140/108     Pulse Rate 04/18/20 0812 (!) 105     Resp 04/18/20 0812 18     Temp 04/18/20 0812 98.4 F (36.9 C)     Temp Source 04/18/20 0812 Oral     SpO2 04/18/20 0812 95 %     Weight 04/18/20 0810 175 lb (79.4 kg)     Height 04/18/20 0810 5\' 9"  (1.753 m)     Head Circumference --      Peak Flow --      Pain Score 04/18/20 0809 5     Pain Loc --      Pain Edu? --      Excl. in GC? --     Constitutional: Alert and oriented.  Slightly weak appearing but in no acute distress. Eyes: Conjunctivae are normal.  EOMI. Head: Subacute appearing ecchymosis to the left periorbital and  maxillary area. Nose: No congestion/rhinnorhea. Mouth/Throat: Mucous membranes are somewhat dry.   Neck: Normal range of motion.  No midline tenderness. Cardiovascular: Normal rate, regular rhythm. Good peripheral circulation. Respiratory: Normal respiratory effort.  No retractions.  Gastrointestinal: Soft and nontender. No distention.  Genitourinary: No flank tenderness. Musculoskeletal: No lower extremity edema.  Extremities warm and well perfused.  Neurologic:  Normal speech and language. No gross focal neurologic deficits are appreciated.  Skin:  Skin is warm and dry. No rash noted. Psychiatric: Calm and cooperative.  ____________________________________________   LABS (all labs ordered are listed, but only abnormal results are displayed)  Labs Reviewed  BASIC METABOLIC PANEL - Abnormal; Notable for the following components:      Result Value   Sodium 124 (*)    Chloride 85 (*)    Glucose, Bld 132 (*)  Calcium 8.8 (*)    All other components within normal limits  CBC - Abnormal; Notable for the following components:   RBC 3.46 (*)    Hemoglobin 11.5 (*)    HCT 32.4 (*)    All other components within normal limits  URINALYSIS, COMPLETE (UACMP) WITH MICROSCOPIC - Abnormal; Notable for the following components:   Ketones, ur TRACE (*)    All other components within normal limits  HEPATIC FUNCTION PANEL - Abnormal; Notable for the following components:   Albumin 3.3 (*)    Alkaline Phosphatase 25 (*)    All other components within normal limits  LACTIC ACID, PLASMA - Abnormal; Notable for the following components:   Lactic Acid, Venous 2.4 (*)    All other components within normal limits  TROPONIN I (HIGH SENSITIVITY) - Abnormal; Notable for the following components:   Troponin I (High Sensitivity) 64 (*)    All other components within normal limits  SARS CORONAVIRUS 2 (TAT 6-24 HRS)  LACTIC ACID, PLASMA  TROPONIN I (HIGH SENSITIVITY)    ____________________________________________  EKG  ED ECG REPORT I, Dionne Bucy, the attending physician, personally viewed and interpreted this ECG.  Date: 04/18/2020 EKG Time: 0824 Rate: 95 Rhythm: normal sinus rhythm QRS Axis: normal Intervals: normal ST/T Wave abnormalities: Nonspecific T wave abnormalities Narrative Interpretation: Nonspecific abnormalities with no evidence of acute ischemia  ____________________________________________  RADIOLOGY    ____________________________________________   PROCEDURES  Procedure(s) performed: No  Procedures  Critical Care performed: No ____________________________________________   INITIAL IMPRESSION / ASSESSMENT AND PLAN / ED COURSE  Pertinent labs & imaging results that were available during my care of the patient were reviewed by me and considered in my medical decision making (see chart for details).  79 year old male with PMH as noted above including pulmonary fibrosis and polymyositis as well as hypertension and diabetes presents with generalized weakness over the last several days.  The symptoms started with vomiting and diarrhea after taking a dose of Augmentin.  The patient had a fall 2 days ago with head injury but no LOC.  I reviewed the past medical records in epic and confirmed the history of pulmonary fibrosis, antisynthetase syndrome and polymyositis.  The patient was seen by pulmonology 3 days ago and started on Augmentin for sinus pain at that time.  He had a CT chest on 4/21 showing fibrosis with chronic progression but no acute findings.  On exam, the patient is somewhat weak appearing but in no acute distress.  He was borderline tachycardic at triage, but this is improved.  His other vital signs are normal.  The abdomen is soft and nontender.  Neurologic exam is nonfocal.  He does have bruising around the left eye and maxillary area but no significant tenderness.  Overall I suspect most likely  dehydration due to the GI symptoms, which may have been a side effect of the Augmentin.  The patient has discontinued the antibiotic which I think is reasonable.  Differential includes AKI or other metabolic etiology, or less likely cardiac cause.  Given that it has been 2 days since the head injury and the patient has no headache or other symptoms of traumatic brain injury, I do not feel imaging is necessary.  I discussed this with the patient, and he also prefers not to proceed with CT at this time.  We will obtain labs, give a fluid bolus, and reassess.  ----------------------------------------- 10:28 AM on 04/18/2020 -----------------------------------------  Patient is feeling about the same after fluids.  Work-up reveals borderline hyponatremia as well as an elevated troponin.  I suspect that this is demand ischemia.  The patient has no chest pain or EKG changes.  Given these findings as well as the patient's overall generalized weakness, I will admit for further management.  I discussed the case with Dr. Blaine Hamper from the hospitalist service. ____________________________________________   FINAL CLINICAL IMPRESSION(S) / ED DIAGNOSES  Final diagnoses:  Dehydration  Generalized weakness  Elevated troponin      NEW MEDICATIONS STARTED DURING THIS VISIT:  New Prescriptions   No medications on file     Note:  This document was prepared using Dragon voice recognition software and may include unintentional dictation errors.    Arta Silence, MD 04/18/20 1029

## 2020-04-18 NOTE — Progress Notes (Signed)
Patient alert and oriented x4. He denies pain/ nausea. Per patient, last BM was yesterday morning. Stool sample pending dt pt not having any BM during this shift. Patient on enteric precautions.  covid test results pending. Upon admission, patient was assisted to the restroom and while he was walking, his oxygen sats dropped to low 70s HR 120s. Patient was placed on O2 2L and O2 sats improved to low 90s. Once patient got back in the bed, O2 sats increased to 99% so pt was taken off oxygen. O2 sats now 95% on room air while at rest.

## 2020-04-18 NOTE — ED Triage Notes (Signed)
Pt arrived via POV with wife with reports of possible dehydration and weakness, has had episodes of diarrhea over the past few days after taking an antibiotic.   Pt required assistance to get out of vehicle.

## 2020-04-19 DIAGNOSIS — R197 Diarrhea, unspecified: Secondary | ICD-10-CM | POA: Diagnosis not present

## 2020-04-19 DIAGNOSIS — R112 Nausea with vomiting, unspecified: Secondary | ICD-10-CM

## 2020-04-19 LAB — BASIC METABOLIC PANEL
Anion gap: 9 (ref 5–15)
BUN: 10 mg/dL (ref 8–23)
CO2: 24 mmol/L (ref 22–32)
Calcium: 8 mg/dL — ABNORMAL LOW (ref 8.9–10.3)
Chloride: 94 mmol/L — ABNORMAL LOW (ref 98–111)
Creatinine, Ser: 0.83 mg/dL (ref 0.61–1.24)
GFR calc Af Amer: >60 mL/min (ref >60)
GFR calc non Af Amer: >60 mL/min (ref >60)
Glucose, Bld: 93 mg/dL (ref 70–99)
Potassium: 3.6 mmol/L (ref 3.5–5.1)
Sodium: 127 mmol/L — ABNORMAL LOW (ref 135–145)

## 2020-04-19 LAB — TSH: TSH: 1.925 u[IU]/mL (ref 0.350–4.500)

## 2020-04-19 LAB — LIPID PANEL
Cholesterol: 208 mg/dL — ABNORMAL HIGH (ref 0–200)
HDL: 44 mg/dL (ref >40)
LDL Cholesterol: 148 mg/dL — ABNORMAL HIGH (ref 0–99)
Total CHOL/HDL Ratio: 4.7 RATIO
Triglycerides: 80 mg/dL (ref <150)
VLDL: 16 mg/dL (ref 0–40)

## 2020-04-19 LAB — CBC
HCT: 29.3 % — ABNORMAL LOW (ref 39.0–52.0)
Hemoglobin: 10 g/dL — ABNORMAL LOW (ref 13.0–17.0)
MCH: 33.2 pg (ref 26.0–34.0)
MCHC: 34.1 g/dL (ref 30.0–36.0)
MCV: 97.3 fL (ref 80.0–100.0)
Platelets: 226 10*3/uL (ref 150–400)
RBC: 3.01 MIL/uL — ABNORMAL LOW (ref 4.22–5.81)
RDW: 14.6 % (ref 11.5–15.5)
WBC: 5.7 10*3/uL (ref 4.0–10.5)
nRBC: 0 % (ref 0.0–0.2)

## 2020-04-19 LAB — GLUCOSE, CAPILLARY
Glucose-Capillary: 109 mg/dL — ABNORMAL HIGH (ref 70–99)
Glucose-Capillary: 155 mg/dL — ABNORMAL HIGH (ref 70–99)
Glucose-Capillary: 91 mg/dL (ref 70–99)
Glucose-Capillary: 97 mg/dL (ref 70–99)

## 2020-04-19 LAB — CK: Total CK: 82 U/L (ref 49–397)

## 2020-04-19 MED ORDER — ENALAPRIL MALEATE 10 MG PO TABS
10.0000 mg | ORAL_TABLET | Freq: Every day | ORAL | Status: DC
  Administered 2020-04-19: 11:00:00 10 mg via ORAL
  Filled 2020-04-19: qty 1

## 2020-04-19 MED ORDER — ENALAPRIL MALEATE 10 MG PO TABS: 10.0000 mg | ORAL_TABLET | Freq: Every day | ORAL | 0 refills | Status: DC

## 2020-04-19 NOTE — Care Management Obs Status (Signed)
MEDICARE OBSERVATION STATUS NOTIFICATION   Patient Details  Name: Lee Johnson MRN: [id number] Date of Birth: [birth year]   Medicare Observation Status Notification Given:  Yes    Allayne Butcher, RN 04/19/2020, 9:29 AM

## 2020-04-19 NOTE — Progress Notes (Signed)
Oxygen saturation on RA at rest 95% Oxygen saturation on RA with exertion 72% Oxygen saturation on 3L  with exertion 97%  Patient requires continuous oxygen at home for pulmonary fibrosis, he becomes short of breath with exertion and his Oxygen saturations drop below 87%.

## 2020-04-19 NOTE — Plan of Care (Signed)
Continue with current plan of care. Pt remains weak and needs assist with all ADL's.

## 2020-04-19 NOTE — TOC Initial Note (Signed)
Transition of Care [facility]) - Initial/Assessment Note    Patient Details  Name: Elek Holderness MRN: [id number] Date of Birth: 23-May-1941  Transition of Care [facility]) CM/SW Contact:    Allayne Butcher, RN Phone Number: 04/19/2020, 9:35 AM  Clinical Narrative:                 Patient placed in observation for dehydration and generalized weakness.  Patient's wife is at the bedside.  Patient gives this RNCM permission to speak with his wife, Delorise Shiner.  Delorise Shiner reports that patient can feed himself and assist with bathing and dressing.  Wife reports that he walks with a walker and has a wheelchair, he also has a bedside commode.  Patient does not drive but his wife provides all transportation.  Patient and wife decline home health services at this time.  Patient is current with PCP, prescriptions come from [facility] and [facility] in [place].   RNCM will cont to follow.    Expected Discharge Plan: Home/Self Care Barriers to Discharge: Continued Medical Work up   Patient Goals and CMS Choice Patient states their goals for this hospitalization and ongoing recovery are:: Patient's wife brought him in to the hospital to hydrate him.      Expected Discharge Plan and Services Expected Discharge Plan: Home/Self Care       Living arrangements for the past 2 months: Single Family Home                           HH Arranged: Refused HH          Prior Living Arrangements/Services Living arrangements for the past 2 months: Single Family Home Lives with:: Spouse Patient language and need for interpreter reviewed:: Yes Do you feel safe going back to the place where you live?: Yes      Need for Family Participation in Patient Care: Yes (Comment)(weakness) Care giver support system in place?: Yes (comment)(wife) Current home services: DME(Walker, wheelchair, bedside commode , walkin shower) Criminal Activity/Legal Involvement Pertinent to Current Situation/Hospitalization: No - Comment as  needed  Activities of Daily Living Home Assistive Devices/Equipment: None ADL Screening (condition at time of admission) Patient's cognitive ability adequate to safely complete daily activities?: Yes Is the patient deaf or have difficulty hearing?: No Does the patient have difficulty seeing, even when wearing glasses/contacts?: No Does the patient have difficulty concentrating, remembering, or making decisions?: No Patient able to express need for assistance with ADLs?: Yes Does the patient have difficulty dressing or bathing?: No Independently performs ADLs?: Yes (appropriate for developmental age) Does the patient have difficulty walking or climbing stairs?: Yes Weakness of Legs: Both Weakness of Arms/Hands: None  Permission Sought/Granted Permission sought to share information with : Case Manager, Family Supports Permission granted to share information with : Yes, Verbal Permission Granted  Share Information with NAME: Delorise Shiner     Permission granted to share info w Relationship: wife     Emotional Assessment   Attitude/Demeanor/Rapport: Engaged Affect (typically observed): Accepting Orientation: : Oriented to Self, Oriented to Place, Oriented to  Time, Oriented to Situation Alcohol / Substance Use: Not Applicable Psych Involvement: No (comment)  Admission diagnosis:  Dehydration [E86.0] Elevated troponin [R77.8] Generalized weakness [R53.1] Nausea vomiting and diarrhea [R11.2, R19.7] Patient Active Problem List   Diagnosis Date Noted  . Generalized weakness 04/18/2020  . Fall 04/18/2020  . Hyponatremia 04/18/2020  . Elevated troponin 04/18/2020  . Elevated lactic acid level 04/18/2020  .  Nausea vomiting and diarrhea 04/18/2020  . Diabetes mellitus without complication ([place])   . Hypertension   . Polymyositis ([place])   . Pulmonary fibrosis ([place])   . Acute posthemorrhagic anemia   . Gastritis   . Second degree hemorrhoids   . Diverticulosis of large intestine without  diverticulitis   . Acute hepatitis   . Jaundice 05/18/2016   PCP:  Valera Castle, MD Pharmacy:  No Pharmacies Listed    Social Determinants of Health (SDOH) Interventions    Readmission Risk Interventions No flowsheet data found.

## 2020-04-19 NOTE — Progress Notes (Signed)
Bedside report complete. Pt requesting to use bedside commode for BM, With assist of 2 nurses, assisted to commode.With activity, sats dropped to 83% on room air and heart rate up to the 140's. Pt is not visibly distressed. Both nurses stayed at bedside to assist patient back to bed. Spouse present at bedside. No BM at this time.

## 2020-04-19 NOTE — Progress Notes (Signed)
Pt assessed for home oxygen needs by nursing staff as well as physical therapy. Pt's oxygen saturation did drop to 72% on room air. He will need need oxygen at home. Social work will be setting this up. Home health services offered to patient and spouse. At this time, spouse declined home services. She states she will wait until after patient sees cardiologist at out patient appointment. This nurse explained to spouse that the home health services were independent of the cardiology appointment and could include such services at physical and occupational therapy. Spouse declines all services at this time. Waiting for discharge note to discharge patient home Spouse states she can take patient home via private vehicle with transfer to wheelchair.

## 2020-04-19 NOTE — Evaluation (Signed)
Physical Therapy Evaluation Patient Details Name: Lee Johnson MRN: [id number] DOB: April 04, 1941 Today's Date: 04/19/2020   History of Present Illness  Pt is a 79 y.o. male presenting to hospital 4/25 with generalized weakness, diarrhea, and vomiting; fall 2 days prior d/t weakness hitting head.  Pt noted with hyponatremia, elevated troponin, and elevated lactic acid level.  PMH includes polymyositis, interstitial lung disease, pulmonary fibrosis, antisynthetase syndrome, DM.  Clinical Impression  Prior to hospital admission, pt was ambulatory short household distances with rollator and also used manual w/c within home as well (pt's wife assisted pt with his mobility most of the time for safety).  Currently pt is min assist semi-supine to sitting edge of bed; CGA with transfers; and CGA ambulating 25 feet with RW.  Per discussion with pt's nurse, therapist attempted to obtain qualifying O2 sats for pt's O2 needs for discharge home.  Pt's O2 sats 95% on room air at rest (about 5 minutes after being taken off 3 L O2 via nasal cannula); 89% on room air with ambulating 25 feet with RW but right after pt sat down on edge of bed, pt's O2 suddenly started to trend down fairly quickly to 72-74% on room air; O2 did not significantly improve with 3 L O2 but then pt's O2 increased to 4 L and able to increase pt's O2 back up to 100% fairly quickly; pt then returned to 3 L O2 (pt on 3 L upon PT arrival) and pt's O2 sats 97% at rest (on 3 L) resting in bed end of session.  HR around 90 bpm at rest but increased to 142 bpm with ambulation.  Nursing notified of pt's vitals.  Pt would benefit from skilled PT to address noted impairments and functional limitations (see below for any additional details).  Pt's wife reports she plans to bring pt into home using manual w/c (although pt typically able to step up short step into home).  Upon hospital discharge, pt would benefit from HHPT (pt's wife requesting no home services  at this time until after Cardiology appt mid May when they figure out what they are going to do for pt's heart): discussed with pt's nurse and care manager.    Follow Up Recommendations Home health PT;Supervision/Assistance - 24 hour    Equipment Recommendations  (pt has rollator (with seat) and manual w/c at home)    Recommendations for Other Services       Precautions / Restrictions Precautions Precautions: Fall Precaution Comments: Monitor O2 sats and HR Restrictions Weight Bearing Restrictions: No      Mobility  Bed Mobility Overal bed mobility: Needs Assistance Bed Mobility: Supine to Sit;Sit to Supine     Supine to sit: Min assist;HOB elevated(assist for trunk) Sit to supine: Supervision;HOB elevated   General bed mobility comments: mild increased effort to perform  Transfers Overall transfer level: Needs assistance Equipment used: Rolling walker (2 wheeled) Transfers: Sit to/from Stand Sit to Stand: Min guard         General transfer comment: fairly strong stand up to RW  Ambulation/Gait Ambulation/Gait assistance: Min guard Gait Distance (Feet): 25 Feet Assistive device: Rolling walker (2 wheeled)   Gait velocity: decreased   General Gait Details: decreased B LE step length/foot clearance/heelstrike; steady with RW use  Stairs            Wheelchair Mobility    Modified Rankin (Stroke Patients Only)       Balance Overall balance assessment: Needs assistance Sitting-balance support: Bilateral upper  extremity supported;Feet supported Sitting balance-Leahy Scale: Poor Sitting balance - Comments: pt with intermittent posterior lean requiring vc's vs min assist for upright posture   Standing balance support: Bilateral upper extremity supported Standing balance-Leahy Scale: Fair Standing balance comment: steady static standing with B UE support on RW                             Pertinent Vitals/Pain Pain Assessment: Faces Faces  Pain Scale: Hurts a little bit 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) Pain Intervention(s): Limited activity within patient's tolerance;Monitored during session;Repositioned    Home Living Family/patient expects to be discharged to:: Private residence Living Arrangements: Spouse/significant other Available Help at Discharge: Family;Available 24 hours/day Type of Home: House Home Access: Stairs to enter Entrance Stairs-Rails: None Entrance Stairs-Number of Steps: short 3-4 inch step to enter home Home Layout: One level Home Equipment: Walker - 4 wheels;Shower seat;Wheelchair - manual;Bedside commode      Prior Function Level of Independence: Needs assistance   Gait / Transfers Assistance Needed: Short household distances with rollator or manual w/c (pt's wife usually around whenever pt mobilizes for safety)     Comments: H/o falls     Hand Dominance        Extremity/Trunk Assessment   Upper Extremity Assessment Upper Extremity Assessment: Generalized weakness    Lower Extremity Assessment Lower Extremity Assessment: Generalized weakness    Cervical / Trunk Assessment Cervical / Trunk Assessment: Normal  Communication   Communication: No difficulties  Cognition Arousal/Alertness: Awake/alert Behavior During Therapy: WFL for tasks assessed/performed Overall Cognitive Status: Within Functional Limits for tasks assessed                                        General Comments   Nursing cleared pt for participation in physical therapy.  Pt agreeable to PT session.  Pt's wife present during session.    Exercises     Assessment/Plan    PT Assessment Patient needs continued PT services  PT Problem List Decreased strength;Decreased balance;Decreased activity tolerance;Decreased knowledge of use of DME;Decreased mobility;Decreased knowledge of precautions;Cardiopulmonary status limiting activity       PT Treatment Interventions DME  instruction;Gait training;Stair training;Functional mobility training;Therapeutic activities;Therapeutic exercise;Balance training;Patient/family education    PT Goals (Current goals can be found in the Care Plan section)  Acute Rehab PT Goals Patient Stated Goal: To return home PT Goal Formulation: With patient/family Time For Goal Achievement: 05/03/20 Potential to Achieve Goals: Good    Frequency Min 2X/week   Barriers to discharge        Co-evaluation               AM-PAC PT "6 Clicks" Mobility  Outcome Measure Help needed turning from your back to your side while in a flat bed without using bedrails?: None Help needed moving from lying on your back to sitting on the side of a flat bed without using bedrails?: A Little Help needed moving to and from a bed to a chair (including a wheelchair)?: A Little Help needed standing up from a chair using your arms (e.g., wheelchair or bedside chair)?: A Little Help needed to walk in hospital room?: A Little Help needed climbing 3-5 steps with a railing? : A Lot 6 Click Score: 18    End of Session Equipment Utilized During Treatment: Gait belt;Oxygen Activity  Tolerance: Treatment limited secondary to medical complications (Comment)(Limited d/t O2 desaturation with activity) Patient left: in bed;with call bell/phone within reach;with bed alarm set;with family/visitor present Nurse Communication: Mobility status;Precautions;Other (comment)(Pt's HR and O2 sats during session) PT Visit Diagnosis: Other abnormalities of gait and mobility (R26.89);Muscle weakness (generalized) (M62.81);History of falling (Z91.81);Difficulty in walking, not elsewhere classified (R26.2)    Time: 1215-1300 PT Time Calculation (min) (ACUTE ONLY): 45 min   Charges:   PT Evaluation $PT Eval Low Complexity: 1 Low PT Treatments $Therapeutic Activity: 8-22 mins       Hendricks Limes, PT 04/19/20, 1:47 PM

## 2020-04-19 NOTE — Discharge Summary (Signed)
Physician Discharge Summary  Dietrick Barris VQM:[id number] DOB: [birth year] DOA: 04/18/2020  PCP: Dione Housekeeper, MD  Admit date: 04/18/2020 Discharge date: 04/19/2020  Admitted From: Home Disposition:  Home  Recommendations for Outpatient Follow-up:  1. Follow up with PCP in 1-2 weeks 2. Please obtain BMP/CBC in one week 3. Please follow up on the following pending results:None  Home Health: No-they were recommended and declined by patient and his wife Equipment/Devices: Home oxygen Discharge Condition: Fair CODE STATUS: Partial Diet recommendation: Heart Healthy / Carb Modified  Brief/Interim Summary: Quantavius Humm is a 79 y.o. male with medical history significant of hypertension, diabetes mellitus, GERD, polymyositis, pulmonary fibrosis, who presents with generalized weakness, fall and diarrhea.  Pt states that he started having diarrhea after he took 1 pill of Augmentin for possible sinus infection on Wednesday.  He had at least 3 times of watery diarrhea on Wednesday, which has been improving.  Patient has nausea and vomited several times with nonbilious nonbloody vomitus. No abdominal pain.  No fever or chills.  He has generalized weakness, and felt on Friday.  Injured her left side of head. He has bruise around his left yes.  No headache or neck pain.   Patient did not had any bowel movement while in hospital.  Nausea and vomiting resolved.  Per patient wife whenever he takes antibiotics he gets diarrhea.  His CK level was normal.  Mildly elevated troponin most likely secondary to demand.  No chest pain.  PT/OT evaluated him and they were recommending home health services which was declined by patient and his wife.  Patient was desaturating with mild exertion requiring 2-3 L of oxygen.  Per wife he was given home oxygen twice before due to his underlying lung condition.  They were asking for small portable oxygen as it was really hard for him to carry cylinder.  Order was  placed for home oxygen.  Patient was also found to have hyponatremia, it seems chronic.  Patient was given some IV fluid for dehydration.  Patient was on HCTZ at home which was discontinued.  Discussed with patient and his wife to communicate with his PCP and he should not be on HCTZ anymore.  He can continue with enalapril and dose can be titrated up by his PCP according to his needs.  TSH was within normal limit.  Patient has an history of polymyositis and pulmonary fibrosis and will continue with rest of his home meds and follow-up with pulmonologist and cardiologist.  He already have an appointment to see his cardiologist next week.  Discharge Diagnoses:  Principal Problem:   Nausea vomiting and diarrhea Active Problems:   Generalized weakness   Fall   Hyponatremia   Elevated troponin   Elevated lactic acid level   Diabetes mellitus without complication (HCC)   Hypertension   Polymyositis (HCC)   Pulmonary fibrosis (HCC)  Discharge Instructions  Discharge Instructions    Diet - low sodium heart healthy   Complete by: As directed    Discharge instructions   Complete by: As directed    It was pleasure taking care of you. It is very important that you use oxygen whenever exerting yourself or moving around. We discussed I am separating your blood pressure pill as it has enalapril and HCTZ combination.  We are stopping HCTZ due to decreased in your sodium and he will continue with enalapril.  You need to follow-up with your primary care physician for further management and dose titration. Please follow-up  with your pulmonologist and cardiologist according to your scheduled appointments.   Increase activity slowly   Complete by: As directed      Allergies as of 04/19/2020      Reactions   Atorvastatin    Other reaction(s): Muscle Pain Other reaction(s): Muscle Pain Other reaction(s): Muscle Pain Other reaction(s): Muscle Pain   Statins Other (See Comments)   Polymyositis.  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     Montelukast    Other reaction(s): Other (See Comments)   Montelukast Sodium    Other reaction(s): Unknown   Other Diarrhea   Sulfa Antibiotics Diarrhea   Sulfamethoxazole-trimethoprim Diarrhea      Medication List    STOP taking these medications   enalapril-hydrochlorothiazide 10-25 MG tablet Commonly known as: VASERETIC     TAKE these medications   Acetaminophen 500 MG capsule Take 500 mg by mouth daily as needed.   alendronate 70 MG tablet Commonly known as: FOSAMAX Take 70 mg by mouth once a week.   aspirin EC 81 MG tablet Take 81 mg by mouth daily.   azaTHIOprine 50 MG tablet Commonly known as: IMURAN Take 125 mg by mouth daily.   Calcium Carbonate-Vitamin D 600-400 MG-UNIT tablet Take 1 tablet by mouth daily.   enalapril 10 MG tablet Commonly known as: VASOTEC Take 1 tablet (10 mg total) by mouth daily. Start taking on: April 20, 2020   esomeprazole 20 MG capsule Commonly known as: NEXIUM Take 20 mg by mouth daily.   Fish Oil 1000 MG Caps Take 1 capsule by mouth 2 (two) times daily.   Flaxseed Oil 1000 MG Caps Take 1 capsule by mouth 2 (two) times daily.   glipiZIDE 10 MG 24 hr tablet Commonly known as: GLUCOTROL XL Take 10 mg by mouth every other day.   metFORMIN 500 MG 24 hr tablet Commonly known as: GLUCOPHAGE-XR Take 2 tablets by mouth 2 (two) times daily after a meal.   predniSONE 5 MG tablet Commonly known as: DELTASONE Take 4 tablets (20 mg total) by mouth daily. What changed: how much to take   tamsulosin 0.4 MG Caps capsule Commonly known as: FLOMAX Take 0.4 mg by mouth daily.   Vitamin D-1000 Max St 25 MCG (1000 UT) tablet Generic drug: Cholecalciferol Take 1,000 Units by mouth daily.            Durable Medical Equipment  (From admission, onward)         Start     Ordered   04/19/20 1255  For home use only DME oxygen  Once    Question Answer Comment  Length of Need Lifetime   Mode or  (Route) Nasal cannula   Liters per Minute 2   Frequency Continuous (stationary and portable oxygen unit needed)   Oxygen conserving device Yes   Oxygen delivery system Gas      04/19/20 1254         Follow-up Information    Valera Castle, MD. Schedule an appointment as soon as possible for a visit.   Specialty: Family Medicine Contact information: [place] [address] [phone]          Allergies  Allergen Reactions  . Atorvastatin     Other reaction(s): Muscle Pain Other reaction(s): Muscle Pain Other reaction(s): Muscle Pain Other reaction(s): Muscle Pain  . Statins Other (See Comments)    Polymyositis. 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    . Montelukast     Other reaction(s): Other (See  Comments)  . Montelukast Sodium     Other reaction(s): Unknown  . Other Diarrhea  . Sulfa Antibiotics Diarrhea  . Sulfamethoxazole-Trimethoprim Diarrhea    Consultations:  None  Procedures/Studies:  No results found.  Subjective: Patient has no new complaints today.  He was accompanied by his wife in the room and would like to go back home.  Discharge Exam: Vitals:   04/18/20 2335 04/19/20 0729  BP: 115/76 (!) 146/54  Pulse: 84 91  Resp: 20 20  Temp: 98.4 F (36.9 C) 97.6 F (36.4 C)  SpO2: 96% 96%   Vitals:   04/18/20 1300 04/18/20 1349 04/18/20 2335 04/19/20 0729  BP: (!) 144/74 (!) 159/93 115/76 (!) 146/54  Pulse: 84 97 84 91  Resp: 19 20 20 20   Temp:  98.4 F (36.9 C) 98.4 F (36.9 C) 97.6 F (36.4 C)  TempSrc:  Oral Oral Oral  SpO2: 97% 98% 96% 96%  Weight:      Height:        General: Pt is alert, awake, not in acute distress, bruising on left side of face. Cardiovascular: RRR, S1/S2 +, no rubs, no gallops Respiratory: CTA bilaterally, no wheezing, no rhonchi Abdominal: Soft, NT, ND, bowel sounds + Extremities: no edema, no cyanosis   The results of significant diagnostics from this hospitalization (including imaging,  microbiology, ancillary and laboratory) are listed below for reference.    Microbiology: Recent Results (from the past 240 hour(s))  SARS CORONAVIRUS 2 (TAT 6-24 HRS) Nasopharyngeal Nasopharyngeal Swab     Status: None   Collection Time: 04/18/20  9:58 AM   Specimen: Nasopharyngeal Swab  Result Value Ref Range Status   SARS Coronavirus 2 NEGATIVE NEGATIVE Final    Comment: (NOTE) SARS-CoV-2 target nucleic acids are NOT DETECTED. The SARS-CoV-2 RNA is generally detectable in upper and lower respiratory specimens during the acute phase of infection. Negative results do not preclude SARS-CoV-2 infection, do not rule out co-infections with other pathogens, and should not be used as the sole basis for treatment or other patient management decisions. Negative results must be combined with clinical observations, patient history, and epidemiological information. The expected result is Negative. Fact Sheet for Patients: 04/20/20 Fact Sheet for Healthcare Providers: HairSlick.no This test is not yet approved or cleared by the [place] FDA and  has been authorized for detection and/or diagnosis of SARS-CoV-2 by FDA under an Emergency Use Authorization (EUA). This EUA will remain  in effect (meaning this test can be used) for the duration of the COVID-19 declaration under Section 56 4(b)(1) of the Act, 21 U.S.C. section 360bbb-3(b)(1), unless the authorization is terminated or revoked sooner. Performed at [facility], [address], [place], [place]      Labs: BNP (last 3 results) No results for input(s): BNP in the last 8760 hours. Basic Metabolic Panel: Recent Labs  Lab 04/18/20 0812 04/18/20 1152 04/18/20 1829 04/19/20 0219  NA 124* 127* 126* 127*  K 4.2 3.7 4.2 3.6  CL 85* 91* 91* 94*  CO2 26 26 26 24   GLUCOSE 132* 105* 143* 93  BUN 14 12 11 10   CREATININE 1.09 0.83 0.95 0.83  CALCIUM 8.8*  8.1* 8.3* 8.0*   Liver Function Tests: Recent Labs  Lab 04/18/20 0812  AST 26  ALT 24  ALKPHOS 25*  BILITOT 1.0  PROT 6.8  ALBUMIN 3.3*   No results for input(s): LIPASE, AMYLASE in the last 168 hours. No results for input(s): AMMONIA in the  last 168 hours. CBC: Recent Labs  Lab 04/18/20 0812 04/19/20 0219  WBC 6.9 5.7  HGB 11.5* 10.0*  HCT 32.4* 29.3*  MCV 93.6 97.3  PLT 252 226   Cardiac Enzymes: Recent Labs  Lab 04/19/20 0219  CKTOTAL 82   BNP: Invalid input(s): POCBNP CBG: Recent Labs  Lab 04/18/20 1923 04/19/20 0043 04/19/20 0303 04/19/20 0726 04/19/20 1117  GLUCAP 170* 97 91 109* 155*   D-Dimer No results for input(s): DDIMER in the last 72 hours. Hgb A1c Recent Labs    04/18/20 1152  HGBA1C 6.4*   Lipid Profile Recent Labs    04/19/20 0219  CHOL 208*  HDL 44  LDLCALC 148*  TRIG 80  CHOLHDL 4.7   Thyroid function studies Recent Labs    04/19/20 0219  TSH 1.925   Anemia work up No results for input(s): VITAMINB12, FOLATE, FERRITIN, TIBC, IRON, RETICCTPCT in the last 72 hours. Urinalysis    Component Value Date/Time   COLORURINE YELLOW 04/18/2020 0910   APPEARANCEUR CLEAR 04/18/2020 0910   LABSPEC 1.020 04/18/2020 0910   PHURINE 6.5 04/18/2020 0910   GLUCOSEU NEGATIVE 04/18/2020 0910   HGBUR NEGATIVE 04/18/2020 0910   BILIRUBINUR NEGATIVE 04/18/2020 0910   KETONESUR TRACE (A) 04/18/2020 0910   PROTEINUR NEGATIVE 04/18/2020 0910   NITRITE NEGATIVE 04/18/2020 0910   LEUKOCYTESUR NEGATIVE 04/18/2020 0910   Sepsis Labs Invalid input(s): PROCALCITONIN,  WBC,  LACTICIDVEN Microbiology Recent Results (from the past 240 hour(s))  SARS CORONAVIRUS 2 (TAT 6-24 HRS) Nasopharyngeal Nasopharyngeal Swab     Status: None   Collection Time: 04/18/20  9:58 AM   Specimen: Nasopharyngeal Swab  Result Value Ref Range Status   SARS Coronavirus 2 NEGATIVE NEGATIVE Final    Comment: (NOTE) SARS-CoV-2 target nucleic acids are NOT  DETECTED. The SARS-CoV-2 RNA is generally detectable in upper and lower respiratory specimens during the acute phase of infection. Negative results do not preclude SARS-CoV-2 infection, do not rule out co-infections with other pathogens, and should not be used as the sole basis for treatment or other patient management decisions. Negative results must be combined with clinical observations, patient history, and epidemiological information. The expected result is Negative. Fact Sheet for Patients: HairSlick.no Fact Sheet for Healthcare Providers: quierodirigir.com This test is not yet approved or cleared by the [place] FDA and  has been authorized for detection and/or diagnosis of SARS-CoV-2 by FDA under an Emergency Use Authorization (EUA). This EUA will remain  in effect (meaning this test can be used) for the duration of the COVID-19 declaration under Section 56 4(b)(1) of the Act, 21 U.S.C. section 360bbb-3(b)(1), unless the authorization is terminated or revoked sooner. Performed at [facility], [address], [place], [place] [address]     Time coordinating discharge: Over 30 minutes  SIGNED:  Arnetha Courser, MD  [facility] 04/19/2020, 1:00 PM  If 7PM-7AM, please contact night-coverage [email or web address]  This record has been created using Conservation officer, historic buildings. Errors have been sought and corrected,but may not always be located. Such creation errors do not reflect on the standard of care.

## 2020-04-19 NOTE — Plan of Care (Signed)
Pt being discharged to home.

## 2020-04-19 NOTE — TOC Transition Note (Signed)
Transition of Care [facility]) - CM/SW Discharge Note   Patient Details  Name: Lee Johnson MRN: [id number] Date of Birth: [birth year]  [facility] CM/SW Contact:  Allayne Butcher, RN Phone Number: 04/19/2020, 1:13 PM   Clinical Narrative:    Patient is medically cleared for discharge home.  Patient and wife refuse home health services.  Patient needs oxygen, [facility] will deliver oxygen to the room.  Mitchell Heir with [facility] given oxygen referral.  Patient's wife will transport patient home.    Final next level of care: Home/Self Care Barriers to Discharge: Barriers Resolved   Patient Goals and CMS Choice Patient states their goals for this hospitalization and ongoing recovery are:: Patient's wife brought him in to the hospital to hydrate him. CMS Medicare.gov Compare Post Acute Care list provided to:: Patient Choice offered to / list presented to : Patient  Discharge Placement                       Discharge Plan and Services                DME Arranged: Oxygen DME Agency: [facility] Date DME Agency Contacted: 04/19/20 Time DME Agency Contacted: [phone] Representative spoke with at DME Agency: Mitchell Heir [facility] Arranged: Refused HH          Social Determinants of Health (SDOH) Interventions     Readmission Risk Interventions No flowsheet data found.

## 2020-08-22 ENCOUNTER — Encounter: Payer: Self-pay | Admitting: Emergency Medicine

## 2020-08-22 ENCOUNTER — Ambulatory Visit
Admission: EM | Admit: 2020-08-22 | Discharge: 2020-08-22 | Disposition: A | Discharge status: Home / Self Care | Payer: Medicare Other | Attending: Family Medicine | Admitting: Family Medicine

## 2020-08-22 ENCOUNTER — Other Ambulatory Visit: Payer: Self-pay

## 2020-08-22 DIAGNOSIS — I10 Essential (primary) hypertension: Secondary | ICD-10-CM

## 2020-08-22 DIAGNOSIS — R350 Frequency of micturition: Secondary | ICD-10-CM

## 2020-08-22 LAB — URINALYSIS, COMPLETE (UACMP) WITH MICROSCOPIC
Bilirubin Urine: NEGATIVE
Glucose, UA: 100 mg/dL — AB
Hgb urine dipstick: NEGATIVE
Leukocytes,Ua: NEGATIVE
Nitrite: NEGATIVE
Specific Gravity, Urine: 1.015 (ref 1.005–1.030)
pH: 5.5 (ref 5.0–8.0)

## 2020-08-22 NOTE — ED Provider Notes (Signed)
[facility]    CSN: [id number] Arrival date & time: 08/22/20  1008      History   Chief Complaint Chief Complaint  Patient presents with  . Dysuria  . Urinary Urgency    HPI Lee Johnson is a 79 y.o. male.   Patient presents with urinary frequency x2 days and dysuria that started last night.  He denies hematuria, fever, abdominal pain, back pain, rash, penile discharge, testicular pain, or other symptoms.  No treatments attempted at home.  His medical history includes diabetes and hypertension.  The history is provided by the patient and the spouse.    Past Medical History:  Diagnosis Date  . Diabetes mellitus without complication (HCC)   . Hypertension   . Polymyositis (HCC)   . Pulmonary fibrosis [facility])     Patient Active Problem List   Diagnosis Date Noted  . Generalized weakness 04/18/2020  . Fall 04/18/2020  . Hyponatremia 04/18/2020  . Elevated troponin 04/18/2020  . Elevated lactic acid level 04/18/2020  . Nausea vomiting and diarrhea 04/18/2020  . Diabetes mellitus without complication (HCC)   . Hypertension   . Polymyositis (HCC)   . Pulmonary fibrosis (HCC)   . Acute posthemorrhagic anemia   . Gastritis   . Second degree hemorrhoids   . Diverticulosis of large intestine without diverticulitis   . Acute hepatitis   . Jaundice 05/18/2016    Past Surgical History:  Procedure Laterality Date  . COLONOSCOPY WITH PROPOFOL N/A 05/25/2016   Procedure: COLONOSCOPY WITH PROPOFOL;  Surgeon: Midge Minium, MD;  Location: [facility] ENDOSCOPY;  Service: Endoscopy;  Laterality: N/A;  . ESOPHAGOGASTRODUODENOSCOPY (EGD) WITH PROPOFOL N/A 05/25/2016   Procedure: ESOPHAGOGASTRODUODENOSCOPY (EGD) WITH PROPOFOL;  Surgeon: Midge Minium, MD;  Location: [facility] ENDOSCOPY;  Service: Endoscopy;  Laterality: N/A;  . EYE SURGERY    . HERNIA REPAIR    . TESTICLE REMOVAL         Home Medications    Prior to Admission medications   Medication Sig Start Date End Date  Taking? Authorizing Provider  Acetaminophen 500 MG coapsule Take 500 mg by mouth daily as needed.    Yes [provider]  alendronate (FOSAMAX) 70 MG tablet Take 70 mg by mouth once a week. 04/14/20  Yes [provider]  aspirin EC 81 MG tablet Take 81 mg by mouth daily.    Yes [provider]  Calcium Carbonate-Vitamin D 600-400 MG-UNIT tablet Take 1 tablet by mouth daily.    Yes [provider]  Cholecalciferol (VITAMIN D-1000 MAX ST) 1000 units tablet Take 1,000 Units by mouth daily.    Yes [provider]  esomeprazole (NEXIUM) 20 MG capsule Take 20 mg by mouth daily.    Yes [provider]  Flaxseed, Linseed, (FLAXSEED OIL) 1000 MG CAPS Take 1 capsule by mouth 2 (two) times daily.    Yes [provider]  metFORMIN (GLUCOPHAGE-XR) 500 MG 24 hr tablet Take 2 tablets by mouth 2 (two) times daily after a meal. 12/06/15 08/22/20 Yes [provider]  Omega-3 Fatty Acids (FISH OIL) 1000 MG CAPS Take 1 capsule by mouth 2 (two) times daily.   Yes [provider]  predniSONE (DELTASONE) 5 MG tablet Take 4 tablets (20 mg total) by mouth daily. Patient taking differently: Take 5 mg by mouth daily.  05/27/16  Yes Enedina Finner, MD  tamsulosin (FLOMAX) 0.4 MG CAPS capsule Take 0.4 mg by mouth daily. 04/08/20  Yes [provider]  azaTHIOprine Tommas Olp)  50 MG tablet Take 125 mg by mouth daily.  02/13/20   [provider]  enalapril (VASOTEC) 10 MG tablet Take 1 tablet (10 mg total) by mouth daily. 04/20/20   Arnetha Courser, MD  glipiZIDE (GLUCOTROL XL) 10 MG 24 hr tablet Take 10 mg by mouth every other day.  05/18/16   [provider]    Family History Family History  Problem Relation Age of Onset  . CAD Mother     Social History Social History   Tobacco Use  . Smoking status: Former Games developer  . Smokeless tobacco: Never Used  Vaping Use  . Vaping Use: Never used  Substance Use Topics  . Alcohol use:  No  . Drug use: No     Allergies   Atorvastatin, Statins, Montelukast, Montelukast sodium, Other, Sulfa antibiotics, and Sulfamethoxazole-trimethoprim   Review of Systems Review of Systems  Constitutional: Negative for chills and fever.  HENT: Negative for ear pain and sore throat.   Eyes: Negative for pain and visual disturbance.  Respiratory: Negative for cough and shortness of breath.   Cardiovascular: Negative for chest pain and palpitations.  Gastrointestinal: Negative for abdominal pain and vomiting.  Genitourinary: Positive for dysuria and frequency. Negative for hematuria.  Musculoskeletal: Negative for arthralgias and back pain.  Skin: Negative for color change and rash.  Neurological: Negative for seizures and syncope.  All other systems reviewed and are negative.    Physical Exam Triage Vital Signs ED Triage Vitals  Enc Vitals Group     BP      Pulse      Resp      Temp      Temp src      SpO2      Weight      Height      Head Circumference      Peak Flow      Pain Score      Pain Loc      Pain Edu?      Excl. in GC?    No data found.  Updated Vital Signs BP (!) 157/96 (BP Location: Left Arm)   Pulse 77   Temp 98.1 F (36.7 C) (Oral)   Resp 16   Ht 5\' 10"  (1.778 m)   Wt 173 lb (78.5 kg)   SpO2 100%   BMI 24.82 kg/m   Visual Acuity Right Eye Distance:   Left Eye Distance:   Bilateral Distance:    Right Eye Near:   Left Eye Near:    Bilateral Near:     Physical Exam Vitals and nursing note reviewed.  Constitutional:      General: He is not in acute distress.    Appearance: He is well-developed.  HENT:     Head: Normocephalic and atraumatic.     Mouth/Throat:     Mouth: Mucous membranes are moist.  Eyes:     Conjunctiva/sclera: Conjunctivae normal.  Cardiovascular:     Rate and Rhythm: Normal rate and regular rhythm.     Heart sounds: No murmur heard.   Pulmonary:     Effort: Pulmonary effort is normal. No respiratory  distress.     Breath sounds: Normal breath sounds.  Abdominal:     Palpations: Abdomen is soft.     Tenderness: There is no abdominal tenderness. There is no right CVA tenderness, left CVA tenderness, guarding or rebound.  Musculoskeletal:     Cervical back: Neck supple.  Skin:    General: Skin is  warm and dry.     Findings: No rash.  Neurological:     Mental Status: He is alert.     Comments: In wheelchair  Psychiatric:        Mood and Affect: Mood normal.        Behavior: Behavior normal.      UC Treatments / Results  Labs (all labs ordered are listed, but only abnormal results are displayed) Labs Reviewed  URINALYSIS, COMPLETE (UACMP) WITH MICROSCOPIC - Abnormal; Notable for the following components:      Result Value   Glucose, UA 100 (*)    Ketones, ur TRACE (*)    Protein, ur TRACE (*)    Bacteria, UA RARE (*)    All other components within normal limits    EKG   Radiology No results found.  Procedures Procedures (including critical care time)  Medications Ordered in UC Medications - No data to display  Initial Impression / Assessment and Plan / UC Course  I have reviewed the triage vital signs and the nursing notes.  Pertinent labs & imaging results that were available during my care of the patient were reviewed by me and considered in my medical decision making (see chart for details).   Urinary frequency.  Elevated blood pressure with known hypertension.  Urine does not show signs of infection.  Instructed patient to follow-up with his PCP if his symptoms or not improving.  Discussed that his blood pressure is elevated today and needs to be rechecked by his PCP in 2 to 4 weeks.  Patient agrees to plan of care.  Final Clinical Impressions(s) / UC Diagnoses   Final diagnoses:  Urinary frequency  Elevated blood pressure reading in office with diagnosis of hypertension     Discharge Instructions     Your urine does not show signs of infection today.   Follow-up with your primary care provider if your symptoms are not improving.    Your blood pressure is elevated today at 157/96.  Please have this rechecked by your primary care provider in 2-4 weeks.         ED Prescriptions    None     PDMP not reviewed this encounter.   Mickie Bail, NP 08/22/20 1125

## 2020-08-22 NOTE — Discharge Instructions (Addendum)
Your urine does not show signs of infection today.  Follow-up with your primary care provider if your symptoms are not improving.    Your blood pressure is elevated today at 157/96.  Please have this rechecked by your primary care provider in 2-4 weeks.

## 2020-08-22 NOTE — ED Triage Notes (Signed)
Patient c/o urinary frequency that started 2 days ago.  Patient reports burning when urinating that started last night.

## 2020-08-23 LAB — URINE CULTURE
Culture: <10000 — AB
Special Requests: NORMAL

## 2021-03-15 ENCOUNTER — Inpatient Hospital Stay
Admission: EM | Admit: 2021-03-15 | Discharge: 2021-03-17 | DRG: 442 | Disposition: A | Discharge status: Home / Self Care | Payer: Medicare Other | Attending: Internal Medicine | Admitting: Internal Medicine

## 2021-03-15 ENCOUNTER — Other Ambulatory Visit: Payer: Self-pay

## 2021-03-15 ENCOUNTER — Emergency Department: Payer: Medicare Other

## 2021-03-15 ENCOUNTER — Inpatient Hospital Stay: Payer: Medicare Other

## 2021-03-15 DIAGNOSIS — J8410 Pulmonary fibrosis, unspecified: Secondary | ICD-10-CM | POA: Diagnosis present

## 2021-03-15 DIAGNOSIS — M3320 Polymyositis, organ involvement unspecified: Secondary | ICD-10-CM | POA: Diagnosis present

## 2021-03-15 DIAGNOSIS — Z79899 Other long term (current) drug therapy: Secondary | ICD-10-CM

## 2021-03-15 DIAGNOSIS — E872 Acidosis, unspecified: Secondary | ICD-10-CM

## 2021-03-15 DIAGNOSIS — E1142 Type 2 diabetes mellitus with diabetic polyneuropathy: Secondary | ICD-10-CM | POA: Diagnosis present

## 2021-03-15 DIAGNOSIS — E1165 Type 2 diabetes mellitus with hyperglycemia: Secondary | ICD-10-CM | POA: Diagnosis present

## 2021-03-15 DIAGNOSIS — E861 Hypovolemia: Secondary | ICD-10-CM | POA: Diagnosis present

## 2021-03-15 DIAGNOSIS — R17 Unspecified jaundice: Secondary | ICD-10-CM | POA: Diagnosis present

## 2021-03-15 DIAGNOSIS — Z888 Allergy status to other drugs, medicaments and biological substances status: Secondary | ICD-10-CM | POA: Diagnosis not present

## 2021-03-15 DIAGNOSIS — Z7984 Long term (current) use of oral hypoglycemic drugs: Secondary | ICD-10-CM | POA: Diagnosis not present

## 2021-03-15 DIAGNOSIS — I10 Essential (primary) hypertension: Secondary | ICD-10-CM | POA: Diagnosis present

## 2021-03-15 DIAGNOSIS — Z7983 Long term (current) use of bisphosphonates: Secondary | ICD-10-CM | POA: Diagnosis not present

## 2021-03-15 DIAGNOSIS — E785 Hyperlipidemia, unspecified: Secondary | ICD-10-CM | POA: Diagnosis present

## 2021-03-15 DIAGNOSIS — B179 Acute viral hepatitis, unspecified: Secondary | ICD-10-CM

## 2021-03-15 DIAGNOSIS — R7401 Elevation of levels of liver transaminase levels: Secondary | ICD-10-CM | POA: Diagnosis present

## 2021-03-15 DIAGNOSIS — Z66 Do not resuscitate: Secondary | ICD-10-CM | POA: Diagnosis present

## 2021-03-15 DIAGNOSIS — E778 Other disorders of glycoprotein metabolism: Secondary | ICD-10-CM | POA: Diagnosis present

## 2021-03-15 DIAGNOSIS — Z87891 Personal history of nicotine dependence: Secondary | ICD-10-CM

## 2021-03-15 DIAGNOSIS — Z882 Allergy status to sulfonamides status: Secondary | ICD-10-CM

## 2021-03-15 DIAGNOSIS — N390 Urinary tract infection, site not specified: Secondary | ICD-10-CM | POA: Diagnosis present

## 2021-03-15 DIAGNOSIS — E871 Hypo-osmolality and hyponatremia: Secondary | ICD-10-CM | POA: Diagnosis present

## 2021-03-15 DIAGNOSIS — Z20822 Contact with and (suspected) exposure to covid-19: Secondary | ICD-10-CM | POA: Diagnosis present

## 2021-03-15 DIAGNOSIS — B957 Other staphylococcus as the cause of diseases classified elsewhere: Secondary | ICD-10-CM | POA: Diagnosis present

## 2021-03-15 DIAGNOSIS — R531 Weakness: Secondary | ICD-10-CM

## 2021-03-15 DIAGNOSIS — E860 Dehydration: Secondary | ICD-10-CM | POA: Diagnosis present

## 2021-03-15 DIAGNOSIS — D638 Anemia in other chronic diseases classified elsewhere: Secondary | ICD-10-CM | POA: Diagnosis present

## 2021-03-15 LAB — HEPATIC FUNCTION PANEL
ALT: 61 U/L — ABNORMAL HIGH (ref 0–44)
AST: 69 U/L — ABNORMAL HIGH (ref 15–41)
Albumin: 3 g/dL — ABNORMAL LOW (ref 3.5–5.0)
Alkaline Phosphatase: 162 U/L — ABNORMAL HIGH (ref 38–126)
Bilirubin, Direct: 3 mg/dL — ABNORMAL HIGH (ref 0.0–0.2)
Indirect Bilirubin: 2.2 mg/dL — ABNORMAL HIGH (ref 0.3–0.9)
Total Bilirubin: 5.2 mg/dL — ABNORMAL HIGH (ref 0.3–1.2)
Total Protein: 6 g/dL — ABNORMAL LOW (ref 6.5–8.1)

## 2021-03-15 LAB — URINALYSIS, COMPLETE (UACMP) WITH MICROSCOPIC
Bacteria, UA: NONE SEEN
Bilirubin Urine: NEGATIVE
Glucose, UA: NEGATIVE mg/dL
Hgb urine dipstick: NEGATIVE
Ketones, ur: 5 mg/dL — AB
Nitrite: POSITIVE — AB
Protein, ur: NEGATIVE mg/dL
Specific Gravity, Urine: 1.016 (ref 1.005–1.030)
pH: 6 (ref 5.0–8.0)

## 2021-03-15 LAB — LACTIC ACID, PLASMA
Lactic Acid, Venous: 5 mmol/L (ref 0.5–1.9)
Lactic Acid, Venous: 5.3 mmol/L (ref 0.5–1.9)
Lactic Acid, Venous: 3.4 mmol/L (ref 0.5–1.9)
Lactic Acid, Venous: 5.4 mmol/L (ref 0.5–1.9)

## 2021-03-15 LAB — CBC
HCT: 27.7 % — ABNORMAL LOW (ref 39.0–52.0)
Hemoglobin: 8.9 g/dL — ABNORMAL LOW (ref 13.0–17.0)
MCH: 36.6 pg — ABNORMAL HIGH (ref 26.0–34.0)
MCHC: 32.1 g/dL (ref 30.0–36.0)
MCV: 114 fL — ABNORMAL HIGH (ref 80.0–100.0)
Platelets: 269 10*3/uL (ref 150–400)
RBC: 2.43 MIL/uL — ABNORMAL LOW (ref 4.22–5.81)
RDW: 16.8 % — ABNORMAL HIGH (ref 11.5–15.5)
WBC: 5.7 10*3/uL (ref 4.0–10.5)
nRBC: 0 % (ref 0.0–0.2)

## 2021-03-15 LAB — LIPASE, BLOOD: Lipase: 94 U/L — ABNORMAL HIGH (ref 11–51)

## 2021-03-15 LAB — RESP PANEL BY RT-PCR (FLU A&B, COVID) ARPGX2
Influenza A by PCR: NEGATIVE
Influenza B by PCR: NEGATIVE
SARS Coronavirus 2 by RT PCR: NEGATIVE

## 2021-03-15 LAB — BASIC METABOLIC PANEL
Anion gap: 14 (ref 5–15)
BUN: 30 mg/dL — ABNORMAL HIGH (ref 8–23)
CO2: 27 mmol/L (ref 22–32)
Calcium: 9.5 mg/dL (ref 8.9–10.3)
Chloride: 92 mmol/L — ABNORMAL LOW (ref 98–111)
Creatinine, Ser: 1.17 mg/dL (ref 0.61–1.24)
GFR, Estimated: >60 mL/min (ref >60)
Glucose, Bld: 179 mg/dL — ABNORMAL HIGH (ref 70–99)
Potassium: 4.2 mmol/L (ref 3.5–5.1)
Sodium: 133 mmol/L — ABNORMAL LOW (ref 135–145)

## 2021-03-15 LAB — MAGNESIUM: Magnesium: 1.6 mg/dL — ABNORMAL LOW (ref 1.7–2.4)

## 2021-03-15 LAB — CK: Total CK: 30 U/L — ABNORMAL LOW (ref 49–397)

## 2021-03-15 LAB — CBG MONITORING, ED: Glucose-Capillary: 179 mg/dL — ABNORMAL HIGH (ref 70–99)

## 2021-03-15 LAB — GLUCOSE, CAPILLARY: Glucose-Capillary: 158 mg/dL — ABNORMAL HIGH (ref 70–99)

## 2021-03-15 MED ORDER — ONDANSETRON 4 MG PO TBDP: 4.0000 mg | ORAL_TABLET | Freq: Once | ORAL | Status: DC

## 2021-03-15 MED ORDER — IBUPROFEN 400 MG PO TABS: 400.0000 mg | ORAL_TABLET | Freq: Four times a day (QID) | ORAL | Status: DC | PRN

## 2021-03-15 MED ORDER — SODIUM CHLORIDE 0.9 % IV SOLN
1.0000 g | Freq: Once | INTRAVENOUS | Status: AC
  Administered 2021-03-15: 1 g via INTRAVENOUS
  Filled 2021-03-15: qty 10

## 2021-03-15 MED ORDER — PANTOPRAZOLE SODIUM 40 MG PO TBEC
40.0000 mg | DELAYED_RELEASE_TABLET | Freq: Every day | ORAL | Status: DC
  Administered 2021-03-16: 40 mg via ORAL
  Filled 2021-03-15: qty 1

## 2021-03-15 MED ORDER — SODIUM CHLORIDE 0.9 % IV BOLUS
1000.0000 mL | Freq: Once | INTRAVENOUS | Status: AC
  Administered 2021-03-15: 1000 mL via INTRAVENOUS

## 2021-03-15 MED ORDER — SODIUM CHLORIDE 0.9 % IV SOLN: INTRAVENOUS | Status: DC

## 2021-03-15 MED ORDER — SODIUM CHLORIDE 0.9 % IV SOLN
1.0000 g | INTRAVENOUS | Status: DC
  Filled 2021-03-15: qty 10

## 2021-03-15 MED ORDER — TAMSULOSIN HCL 0.4 MG PO CAPS
0.4000 mg | ORAL_CAPSULE | Freq: Every day | ORAL | Status: DC
  Administered 2021-03-15 – 2021-03-16 (×2): 0.4 mg via ORAL
  Filled 2021-03-15 (×2): qty 1

## 2021-03-15 MED ORDER — MAGNESIUM SULFATE 2 GM/50ML IV SOLN
2.0000 g | Freq: Once | INTRAVENOUS | Status: AC
  Administered 2021-03-15: 2 g via INTRAVENOUS
  Filled 2021-03-15: qty 50

## 2021-03-15 MED ORDER — INSULIN ASPART 100 UNIT/ML ~~LOC~~ SOLN
0.0000 [IU] | Freq: Every day | SUBCUTANEOUS | Status: DC
  Administered 2021-03-16: 21:00:00 2 [IU] via SUBCUTANEOUS
  Filled 2021-03-15: qty 1

## 2021-03-15 MED ORDER — ENOXAPARIN SODIUM 40 MG/0.4ML ~~LOC~~ SOLN
40.0000 mg | SUBCUTANEOUS | Status: DC
  Administered 2021-03-15 – 2021-03-16 (×2): 40 mg via SUBCUTANEOUS
  Filled 2021-03-15 (×2): qty 0.4

## 2021-03-15 MED ORDER — INSULIN ASPART 100 UNIT/ML ~~LOC~~ SOLN
0.0000 [IU] | Freq: Three times a day (TID) | SUBCUTANEOUS | Status: DC
  Administered 2021-03-15: 2 [IU] via SUBCUTANEOUS
  Administered 2021-03-16: 14:00:00 3 [IU] via SUBCUTANEOUS
  Administered 2021-03-16: 1 [IU] via SUBCUTANEOUS
  Administered 2021-03-17: 09:00:00 2 [IU] via SUBCUTANEOUS
  Filled 2021-03-15 (×4): qty 1

## 2021-03-15 MED ORDER — PREDNISONE 5 MG PO TABS
7.5000 mg | ORAL_TABLET | Freq: Every day | ORAL | Status: DC
  Administered 2021-03-16 – 2021-03-17 (×2): 7.5 mg via ORAL
  Filled 2021-03-15 (×2): qty 1

## 2021-03-15 MED ORDER — ENALAPRIL MALEATE 5 MG PO TABS
5.0000 mg | ORAL_TABLET | Freq: Every day | ORAL | Status: DC
  Administered 2021-03-15 – 2021-03-17 (×3): 5 mg via ORAL
  Filled 2021-03-15 (×3): qty 1

## 2021-03-15 MED ORDER — ONDANSETRON HCL 4 MG/2ML IJ SOLN
4.0000 mg | Freq: Once | INTRAMUSCULAR | Status: DC
  Filled 2021-03-15: qty 2

## 2021-03-15 MED ORDER — MAGNESIUM OXIDE 400 (241.3 MG) MG PO TABS
400.0000 mg | ORAL_TABLET | Freq: Two times a day (BID) | ORAL | Status: DC
  Administered 2021-03-15 – 2021-03-17 (×4): 400 mg via ORAL
  Filled 2021-03-15 (×4): qty 1

## 2021-03-15 NOTE — ED Provider Notes (Signed)
[facility] Emergency Department Provider Note  Time seen: 1:17 PM  I have reviewed the triage vital signs and the nursing notes.   HISTORY  Chief Complaint Weakness   HPI Lee Johnson is a 80 y.o. male with a past medical history of diabetes, polymyositis, pulmonary fibrosis, hypertension, presents to the emergency department for generalized weakness.   According to the patient and wife for the past several weeks the patient has had progressive generalized weakness but has been acutely worse over the past 1 week now with mild cough and congestion.  Wife states patient recently had a urinary tract infection but was unclear if this had resolved.  States patient also has a history of hypomagnesemia with similar weakness in the past.  Here the patient is awake alert, no distress does have a slight hue of jaundice and possible scleral icterus.  No abdominal pain nausea or vomiting.  Wife states they saw their PCP today who thought that his right arm seemed somewhat weaker, in addition to his generalized weakness sent him to the emergency department for evaluation.  Past Medical History:  Diagnosis Date  . Diabetes mellitus without complication (HCC)   . Hypertension   . Polymyositis (HCC)   . Pulmonary fibrosis [facility])     Patient Active Problem List   Diagnosis Date Noted  . Generalized weakness 04/18/2020  . Fall 04/18/2020  . Hyponatremia 04/18/2020  . Elevated troponin 04/18/2020  . Elevated lactic acid level 04/18/2020  . Nausea vomiting and diarrhea 04/18/2020  . Diabetes mellitus without complication (HCC)   . Hypertension   . Polymyositis (HCC)   . Pulmonary fibrosis (HCC)   . Acute posthemorrhagic anemia   . Gastritis   . Second degree hemorrhoids   . Diverticulosis of large intestine without diverticulitis   . Acute hepatitis   . Jaundice 05/18/2016    Past Surgical History:  Procedure Laterality Date  . COLONOSCOPY WITH PROPOFOL N/A  05/25/2016   Procedure: COLONOSCOPY WITH PROPOFOL;  Surgeon: Midge Minium, MD;  Location: [facility] ENDOSCOPY;  Service: Endoscopy;  Laterality: N/A;  . ESOPHAGOGASTRODUODENOSCOPY (EGD) WITH PROPOFOL N/A 05/25/2016   Procedure: ESOPHAGOGASTRODUODENOSCOPY (EGD) WITH PROPOFOL;  Surgeon: Midge Minium, MD;  Location: [facility] ENDOSCOPY;  Service: Endoscopy;  Laterality: N/A;  . EYE SURGERY    . HERNIA REPAIR    . TESTICLE REMOVAL      Prior to Admission medications   Medication Sig Start Date End Date Taking? Authorizing Provider  Acetaminophen 500 MG coapsule Take 500 mg by mouth daily as needed.     [provider]  alendronate (FOSAMAX) 70 MG tablet Take 70 mg by mouth once a week. 04/14/20   [provider]  aspirin EC 81 MG tablet Take 81 mg by mouth daily.     [provider]  azaTHIOprine (IMURAN) 50 MG tablet Take 125 mg by mouth daily.  02/13/20   [provider]  Calcium Carbonate-Vitamin D 600-400 MG-UNIT tablet Take 1 tablet by mouth daily.     [provider]  Cholecalciferol (VITAMIN D-1000 MAX ST) 1000 units tablet Take 1,000 Units by mouth daily.     [provider]  enalapril (VASOTEC) 10 MG tablet Take 1 tablet (10 mg total) by mouth daily. 04/20/20   Arnetha Courser, MD  esomeprazole (NEXIUM) 20 MG capsule Take 20 mg by mouth daily.     [provider]  Flaxseed, Linseed, (FLAXSEED OIL) 1000 MG CAPS Take 1 capsule by mouth 2 (two) times  daily.     [provider]  glipiZIDE (GLUCOTROL XL) 10 MG 24 hr tablet Take 10 mg by mouth every other day.  05/18/16   [provider]  metFORMIN (GLUCOPHAGE-XR) 500 MG 24 hr tablet Take 2 tablets by mouth 2 (two) times daily after a meal. 12/06/15 08/22/20  [provider]  Omega-3 Fatty Acids (FISH OIL) 1000 MG CAPS Take 1 capsule by mouth 2 (two) times daily.    [provider]  predniSONE (DELTASONE) 5 MG tablet Take 4 tablets (20 mg total) by mouth daily. Patient  taking differently: Take 5 mg by mouth daily.  05/27/16   Enedina Finner, MD  tamsulosin (FLOMAX) 0.4 MG CAPS capsule Take 0.4 mg by mouth daily. 04/08/20   [provider]    Allergies  Allergen Reactions  . Atorvastatin     Other reaction(s): Muscle Pain Other reaction(s): Muscle Pain Other reaction(s): Muscle Pain Other reaction(s): Muscle Pain  . Statins Other (See Comments)    Polymyositis. 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    . Montelukast     Other reaction(s): Other (See Comments)  . Montelukast Sodium     Other reaction(s): Unknown  . Other Diarrhea  . Sulfa Antibiotics Diarrhea  . Sulfamethoxazole-Trimethoprim Diarrhea    Family History  Problem Relation Age of Onset  . CAD Mother     Social History Social History   Tobacco Use  . Smoking status: Former Games developer  . Smokeless tobacco: Never Used  Vaping Use  . Vaping Use: Never used  Substance Use Topics  . Alcohol use: No  . Drug use: No    Review of Systems Constitutional: Negative for fever.  Positive for generalized fatigue and weakness. Cardiovascular: Negative for chest pain. Respiratory: Negative for shortness of breath.  Mild cough Gastrointestinal: Negative for abdominal pain, vomiting  Genitourinary: Negative for urinary compaints Musculoskeletal: Negative for musculoskeletal complaints Skin: Patient denies skin color changes. Neurological: Negative for headache All other ROS negative  ____________________________________________   PHYSICAL EXAM:  VITAL SIGNS: ED Triage Vitals  Enc Vitals Group     BP 03/15/21 0959 (!) 156/83     Pulse Rate 03/15/21 0959 79     Resp 03/15/21 0959 18     Temp 03/15/21 0959 97.7 F (36.5 C)     Temp src --      SpO2 03/15/21 0959 100 %     Weight 03/15/21 1000 170 lb (77.1 kg)     Height 03/15/21 1000 5\' 10"  (1.778 m)     Head Circumference --      Peak Flow --      Pain Score 03/15/21 1000 8     Pain Loc --      Pain Edu? --      Excl. in GC? --     Constitutional: Alert and oriented. Well appearing and in no distress. Eyes: Slight scleral icterus. ENT      Head: Normocephalic and atraumatic.      Mouth/Throat: Mucous membranes are moist. Cardiovascular: Normal rate, regular rhythm. Respiratory: Normal respiratory effort without tachypnea nor retractions. Breath sounds are clear Gastrointestinal: Soft and nontender. No distention.  Umbilical hernia present but reducible and nontender, patient states longstanding. Musculoskeletal: Nontender with normal range of motion in all extremities.  Neurologic:  Normal speech and language. No gross focal neurologic deficits  Skin:  Skin is warm.  Appears mildly jaundiced. Psychiatric: Mood and affect are normal.   ____________________________________________    EKG  EKG  viewed and interpreted by myself shows sinus tachycardia 109 bpm with a narrow QRS, left axis deviation, largely normal intervals with nonspecific ST changes but no elevation.  ____________________________________________    RADIOLOGY  CT scan head is negative. Chest x-ray is negative for acute abnormality but does show progressive fibrotic changes.  ____________________________________________   INITIAL IMPRESSION / ASSESSMENT AND PLAN / ED COURSE  Pertinent labs & imaging results that were available during my care of the patient were reviewed by me and considered in my medical decision making (see chart for details).   Patient presents to the emergency department for generalized fatigue weakness which they state has been progressing but acutely worse over the past 1 week.  Also states their physician noted a right upper extremity weakness/deficit on his examination.  Currently the patient appears well with equal strength bilaterally in all extremities and no sensory deficits.  Patient does appear generally fatigued.  Appears to have mild scleral icterus and jaundiced skin.  Patient's lab results show nitrite  positive urine possibly indicating urinary tract infection we will cover with antibiotics and send a urine culture.  Patient's lactic acid is significantly elevated at 5.0.  Patient IV hydrated with 1 L of normal saline and repeat lactic acid has increased to 5.3.  We will send blood cultures as a precaution.  Patient's magnesium is largely within normal limits.  Remainder the lab work shows no significant abnormality patient is anemic although largely unchanged from prior values.  Hepatic function panel and lipase pending.  Patient's hepatic function panel shows increased LFTs and a total bilirubin of 5.2.  Patient's lipase is elevated at 94.  Patient has no abdominal tenderness or complaints of pain however given the elevated LFTs we will proceed with an ultrasound to further evaluate.  I reviewed the patient's chart and approximately 4 years ago he was admitted for a very similar presentation for generalized weakness found to have an elevated bilirubin and LFTs with an otherwise reassuring work-up.  We will continue IV hydration we will obtain a right upper quadrant ultrasound to further evaluate.  Given the patient's generalized weakness elevated LFTs including total bilirubin, urinary tract infection anticipate admission to the hospital service once his emergency department work-up is been completed.   Lee Johnson was evaluated in Emergency Department on 03/15/2021 for the symptoms described in the history of present illness. He was evaluated in the context of the global COVID-19 pandemic, which necessitated consideration that the patient might be at risk for infection with the SARS-CoV-2 virus that causes COVID-19. Institutional protocols and algorithms that pertain to the evaluation of patients at risk for COVID-19 are in a state of rapid change based on information released by regulatory bodies including the CDC and federal and state organizations. These policies and algorithms were followed during  the patient's care in the ED.  ____________________________________________   FINAL CLINICAL IMPRESSION(S) / ED DIAGNOSES  Generalized weakness Acute hepatitis/transaminitis Urinary tract infection   Minna Antis, MD 03/15/21 1511

## 2021-03-15 NOTE — H&P (Addendum)
History and Physical  Lee Johnson MBE:[id number] DOB: 18-Jun-1941 DOA: 03/15/2021  Referring physician: Dr. Lenard Lance, EDP PCP: Dione Housekeeper, MD  Outpatient Specialists: Rheumatology Patient coming from: Home.  Chief Complaint: Generalized weakness  HPI: Lee Johnson is a 80 y.o. male with medical history significant for polymyositis, pulmonary fibrosis, chronic hypoxemia on 4L [place], essential hypertension, DM2, polyneuropathy, hyperlipidemia who presented to [facility] ED as a referral from his primary care provider due to worsening generalized weakness of 1 month duration acutely worsened in the last few days.  Patient is a poor historian.  His wife at bedside reports that 2 days ago she noticed jaundice, icteric sclera.  They went to his PCP yesterday who recommended to go to the ED for further evaluation.  Metformin was discontinued and his prednisone dose was reduced to 7.5 mg daily.  No other changes in his medications.  Per his wife he had a similar appearance about 4 days ago and it was thought that it was secondary to [facility] which was discontinued at that time.  Patient stopped taking CellCept in August 2021 due to hypomagnesemia and concern for arrhythmia.  Patient denies having any significant abdominal pain.  Denies having any diarrhea.  Denies dysuria or fevers.  Per his wife at bedside his mentation is at baseline.  He presented to the ED for further evaluation.  In the ED work-up revealed acute transaminitis and hyperbilirubinemia.  Right upper quadrant abdominal ultrasound ordered and pending.  ED Course:  Afebrile, BP 150/74, pulse 86, respiratory 22, O2 saturation 100% on 4L.  Lab studies significant for serum sodium 133, serum glucose 179, magnesium 1.6, alkaline phosphatase 162, lipase 94, AST 69, ALT 61, total bilirubin 5.2, lactic acid 5.3 from 5.0, hemoglobin 8.9 with baseline of 10, MCV 114, platelet 269, WBC 5.7.  UA positive for nitrite, WBC 6-10.  Review of  Systems: Review of systems as noted in the HPI. All other systems reviewed and are negative.   Past Medical History:  Diagnosis Date  . Diabetes mellitus without complication (HCC)   . Hypertension   . Polymyositis (HCC)   . Pulmonary fibrosis (HCC)    Past Surgical History:  Procedure Laterality Date  . COLONOSCOPY WITH PROPOFOL N/A 05/25/2016   Procedure: COLONOSCOPY WITH PROPOFOL;  Surgeon: Midge Minium, MD;  Location: [facility] ENDOSCOPY;  Service: Endoscopy;  Laterality: N/A;  . ESOPHAGOGASTRODUODENOSCOPY (EGD) WITH PROPOFOL N/A 05/25/2016   Procedure: ESOPHAGOGASTRODUODENOSCOPY (EGD) WITH PROPOFOL;  Surgeon: Midge Minium, MD;  Location: [facility] ENDOSCOPY;  Service: Endoscopy;  Laterality: N/A;  . EYE SURGERY    . HERNIA REPAIR    . TESTICLE REMOVAL      Social History:  reports that he has quit smoking. He has never used smokeless tobacco. He reports that he does not drink alcohol and does not use drugs.   Allergies  Allergen Reactions  . Atorvastatin     Other reaction(s): Muscle Pain Other reaction(s): Muscle Pain Other reaction(s): Muscle Pain Other reaction(s): Muscle Pain  . Statins Other (See Comments)    Polymyositis. 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    . Montelukast     Other reaction(s): Other (See Comments)  . Montelukast Sodium     Other reaction(s): Unknown  . Other Diarrhea  . Sulfa Antibiotics Diarrhea  . Sulfamethoxazole-Trimethoprim Diarrhea    Family History  Problem Relation Age of Onset  . CAD Mother       Prior to Admission medications   Medication Sig Start Date End Date  Taking? Authorizing Provider  Acetaminophen 500 MG coapsule Take 500 mg by mouth daily as needed.    Yes [provider]  alendronate (FOSAMAX) 70 MG tablet Take 70 mg by mouth once a week. 04/14/20  Yes [provider]  aspirin EC 81 MG tablet Take 81 mg by mouth daily.    Yes [provider]  azaTHIOprine (IMURAN) 50 MG tablet Take 125 mg by mouth daily.   02/13/20  Yes [provider]  Calcium Carbonate-Vitamin D 600-400 MG-UNIT tablet Take 1 tablet by mouth daily.    Yes [provider]  Cholecalciferol 25 MCG (1000 UT) tablet Take 1,000 Units by mouth daily.    Yes [provider]  enalapril (VASOTEC) 10 MG tablet Take 1 tablet (10 mg total) by mouth daily. 04/20/20   Arnetha Courser, MD  enalapril-hydrochlorothiazide (VASERETIC) 10-25 MG tablet Take 1 tablet by mouth daily. 12/20/20   [provider]  esomeprazole (NEXIUM) 20 MG capsule Take 20 mg by mouth daily.     [provider]  Flaxseed, Linseed, (FLAXSEED OIL) 1000 MG CAPS Take 1 capsule by mouth 2 (two) times daily.     [provider]  glipiZIDE (GLUCOTROL XL) 10 MG 24 hr tablet Take 10 mg by mouth every other day.  05/18/16   [provider]  metFORMIN (GLUCOPHAGE-XR) 500 MG 24 hr tablet Take 2 tablets by mouth 2 (two) times daily after a meal. 12/06/15 08/22/20  [provider]  Omega-3 Fatty Acids (FISH OIL) 1000 MG CAPS Take 1 capsule by mouth 2 (two) times daily.    [provider]  predniSONE (DELTASONE) 5 MG tablet Take 4 tablets (20 mg total) by mouth daily. Patient taking differently: Take 5 mg by mouth daily.  05/27/16   Enedina Finner, MD  tamsulosin (FLOMAX) 0.4 MG CAPS capsule Take 0.4 mg by mouth daily. 04/08/20   [provider]    Physical Exam: BP (!) 152/72   Pulse 81   Temp 97.7 F (36.5 C)   Resp (!) 35   Ht 5\' 10"  (1.778 m)   Wt 77.1 kg   SpO2 100%   BMI 24.39 kg/m   . General: 80 y.o. year-old male well developed well nourished in no acute distress.  Alert and interactive.  Icteric sclera. . Cardiovascular: Regular rate and rhythm with no rubs or gallops.  No thyromegaly or JVD noted.  No lower extremity edema. 2/4 pulses in all 4 extremities. 80 Respiratory: Clear to auscultation with no wheezes or rales. Good inspiratory effort. . Abdomen: Soft nontender nondistended with  normal bowel sounds x4 quadrants. . Muskuloskeletal: No cyanosis, clubbing or edema noted bilaterally . Neuro: CN II-XII intact, strength, sensation, reflexes . Skin: No ulcerative lesions noted or rashes.  No jaundice. Marland Kitchen Psychiatry: Judgement and insight appear normal. Mood is appropriate for condition and setting          Labs on Admission:  Basic Metabolic Panel: Recent Labs  Lab 03/15/21 1010  NA 133*  K 4.2  CL 92*  CO2 27  GLUCOSE 179*  BUN 30*  CREATININE 1.17  CALCIUM 9.5  MG 1.6*   Liver Function Tests: Recent Labs  Lab 03/15/21 1010  AST 69*  ALT 61*  ALKPHOS 162*  BILITOT 5.2*  PROT 6.0*  ALBUMIN 3.0*   Recent Labs  Lab 03/15/21 1010  LIPASE 94*   No results for input(s): AMMONIA in the last 168 hours. CBC: Recent Labs  Lab 03/15/21 1010  WBC 5.7  HGB 8.9*  HCT 27.7*  MCV 114.0*  PLT 269   Cardiac Enzymes: Recent Labs  Lab 03/15/21 1010  CKTOTAL 30*    BNP (last 3 results) No results for input(s): BNP in the last 8760 hours.  ProBNP (last 3 results) No results for input(s): PROBNP in the last 8760 hours.  CBG: No results for input(s): GLUCAP in the last 168 hours.  Radiological Exams on Admission: DG Chest 2 View  Result Date: 03/15/2021 CLINICAL DATA:  Cough and weakness EXAM: CHEST - 2 VIEW COMPARISON:  05/18/2016 FINDINGS: Cardiac shadow is enlarged but stable. Aortic calcifications are noted. Lungs are well aerated bilaterally with bibasilar scarring and diffuse fibrotic changes. No acute focal infiltrate or effusion is noted. No acute bony abnormality is noted. IMPRESSION: Progressive fibrotic changes and scarring in the bases when compared with the prior exam. Electronically Signed   By: Alcide CleverMark  Lukens M.D.   On: 03/15/2021 11:18   CT Head Wo Contrast  Result Date: 03/15/2021 CLINICAL DATA:  Transient ischemic attack (TIA) Right arm weakness EXAM: CT HEAD WITHOUT CONTRAST TECHNIQUE: Contiguous axial images were obtained from the  base of the skull through the vertex without intravenous contrast. COMPARISON:  None. FINDINGS: Brain: No evidence of acute large vascular territory infarction, hemorrhage, hydrocephalus, extra-axial collection or mass lesion/mass effect. Age related global parenchymal volume loss with ex vacuo dilatation of ventricular system. There are scattered and more confluent hypodense subcortical and periventricular white matter hypodensities which are nonspecific but likely represent a mild burden of chronic ischemic white matter disease. Mineralization left basal ganglia. Vascular: No hyperdense vessel. Atherosclerotic calcifications of the intracranial portions of the internal and vertebral arteries. Skull: Unremarkable.  Negative for fracture or focal lesion. Sinuses/Orbits: Paranasal sinuses and mastoid air cells are predominantly clear. Other: Cerumen in bilateral internal auditory canals. IMPRESSION: 1. No acute intracranial findings. 2. Age related global parenchymal volume loss with mild burden of chronic ischemic white matter disease. Electronically Signed   By: Maudry MayhewJeffrey  Waltz MD   On: 03/15/2021 13:04    EKG: I independently viewed the EKG done and my findings are as followed: Sinus tachycardia rate of 79, nonspecific ST-T changes.  QTc 442.  Assessment/Plan Present on Admission: . Transaminitis  Active Problems:   Transaminitis  Acute transaminitis, unclear etiology, secondary to Imuran? Presented with elevated liver chemistries T bili 5.2. Most recent change in medications: Metformin recently discontinued and prednisone dose reduced to 7.5mg . Right upper quadrant abdominal ultrasound ordered and pending. Avoid hepatotoxic agents. We will hold off home Imuran for now due to possible hepatotoxicity as a side effect. Start gentle IV fluid hydration.  Hyperbilirubinemia, unclear etiology Elevated direct and indirect bilirubin. Follow right upper quadrant abdominal ultrasound. Consult  GI.  Lactic acidosis Presented with lactic acid 5.0, uptrending to 5.3 Continue IV fluid hydration Repeat lactic acid level.  Generalized weakness likely multifactorial secondary to hypomagnesemia, acute illness, dehydration, poor oral intake, possible UTI Reports worsening acutely in the last few days. Replete electrolytes, treat underlying conditions PT OT to assess Fall precautions  Possible UTI He denies dysuria UA positive nitrite and WBC Start Rocephin empirically Follow urine culture and blood cultures. DC antibiotics if urine culture is negative. IV fluid hydration  Hypovolemic hyponatremia Presented with serum sodium 133 Start normal saline IV fluid hydration.  Essential hypertension BP is not at goal Hold off home HCTZ due to hyponatremia Resume home enalapril Monitor vital signs  Type 2 diabetes with hyperglycemia Obtain hemoglobin A1c  Start insulin sliding scale Hold off home oral hypoglycemics  Hypomagnesemia Serum magnesium 1.6 Replete intravenously.  Elevated lipase Presents with lipase 94 Denies any abdominal pain Repeat lipase level in the morning.  Anemia of chronic disease Baseline hemoglobin appears to be 11 Presented with hemoglobin of 8.9 and MCV 114 Obtain iron studies, B12 and folic acid level. Monitor H&H  History of polymyositis/history of pulmonary fibrosis Hold off home Imuran due to possible hepatotoxicity as a side effect. Resume home prednisone. Contact his rheumatologist in the morning. Home Ofev was discontinued 4 years ago due to jaundice Home CellCept was discontinued in August 2021 due to hypomagnesemia.   DVT prophylaxis: Subcu Lovenox daily.  Code Status: DNR as stated by the patient himself.  His wife at bedside.  Family Communication: His wife at bedside.  All questions answered to the best of my ability.  Disposition Plan: Admit to MedSurg unit with remote telemetry.  Consults called: GI consult.  Admission  status: Inpatient status.  Patient will require at least 2 midnights for further evaluation and treatment of present condition.   Status is: Inpatient   Dispo:  Patient From: Home  Planned Disposition: Home  Anticipated on discharge 03/17/2021 or when GI signs off.  Medically stable for discharge: No         Darlin Drop MD [facility] Pager [phone]  If 7PM-7AM, please contact night-coverage [email or web address] Password [facility]  03/15/2021, 2:47 PM

## 2021-03-15 NOTE — ED Triage Notes (Signed)
Pt c/o of generalized weakness for a while. States full aches all over body. wears 4 L chronically. C/o cough and congestion. States urinates a lot. Wife stated that urine culture has grown some bacteria but unsure what.

## 2021-03-15 NOTE — ED Notes (Signed)
MD Bradler informed of pt's Lactic value of 5.0

## 2021-03-15 NOTE — ED Notes (Signed)
See triage note, pt wife states to ED to get [place] of right arm. Denies pain to arm, states doctor told them it was discolored. No discoloration, swelling or redness noted to right arm. When asked, wife states pt has been weak since magnesium has been low, states has been taking supplements correctly.  Pt 100% on 4L [place], wears 4L chronically.

## 2021-03-15 NOTE — Plan of Care (Signed)
Pt admitted to unit. Vitals stable. Wife at bedside. Pt denies pain. Chief complaint is weakness and weight loss. Will continue to monitor.  Problem: Education: Goal: Knowledge of General Education information will improve Description: Including pain rating scale, medication(s)/side effects and non-pharmacologic comfort measures Outcome: Progressing   Problem: Health Behavior/Discharge Planning: Goal: Ability to manage health-related needs will improve Outcome: Progressing   Problem: Clinical Measurements: Goal: Ability to maintain clinical measurements within normal limits will improve Outcome: Progressing Goal: Will remain free from infection Outcome: Progressing Goal: Diagnostic test results will improve Outcome: Progressing Goal: Respiratory complications will improve Outcome: Progressing Goal: Cardiovascular complication will be avoided Outcome: Progressing   Problem: Activity: Goal: Risk for activity intolerance will decrease Outcome: Progressing   Problem: Nutrition: Goal: Adequate nutrition will be maintained Outcome: Progressing   Problem: Coping: Goal: Level of anxiety will decrease Outcome: Progressing   Problem: Elimination: Goal: Will not experience complications related to bowel motility Outcome: Progressing Goal: Will not experience complications related to urinary retention Outcome: Progressing   Problem: Pain Managment: Goal: General experience of comfort will improve Outcome: Progressing   Problem: Safety: Goal: Ability to remain free from injury will improve Outcome: Progressing   Problem: Skin Integrity: Goal: Risk for impaired skin integrity will decrease Outcome: Progressing

## 2021-03-16 ENCOUNTER — Inpatient Hospital Stay: Payer: Medicare Other

## 2021-03-16 ENCOUNTER — Encounter: Payer: Self-pay | Admitting: Internal Medicine

## 2021-03-16 LAB — GLUCOSE, CAPILLARY
Glucose-Capillary: 120 mg/dL — ABNORMAL HIGH (ref 70–99)
Glucose-Capillary: 224 mg/dL — ABNORMAL HIGH (ref 70–99)
Glucose-Capillary: 145 mg/dL — ABNORMAL HIGH (ref 70–99)
Glucose-Capillary: 205 mg/dL — ABNORMAL HIGH (ref 70–99)

## 2021-03-16 LAB — COMPREHENSIVE METABOLIC PANEL
ALT: 50 U/L — ABNORMAL HIGH (ref 0–44)
AST: 54 U/L — ABNORMAL HIGH (ref 15–41)
Albumin: 2.4 g/dL — ABNORMAL LOW (ref 3.5–5.0)
Alkaline Phosphatase: 133 U/L — ABNORMAL HIGH (ref 38–126)
Anion gap: 8 (ref 5–15)
BUN: 22 mg/dL (ref 8–23)
CO2: 31 mmol/L (ref 22–32)
Calcium: 8.5 mg/dL — ABNORMAL LOW (ref 8.9–10.3)
Chloride: 100 mmol/L (ref 98–111)
Creatinine, Ser: 0.99 mg/dL (ref 0.61–1.24)
GFR, Estimated: >60 mL/min (ref >60)
Glucose, Bld: 104 mg/dL — ABNORMAL HIGH (ref 70–99)
Potassium: 4.4 mmol/L (ref 3.5–5.1)
Sodium: 139 mmol/L (ref 135–145)
Total Bilirubin: 4.5 mg/dL — ABNORMAL HIGH (ref 0.3–1.2)
Total Protein: 5.1 g/dL — ABNORMAL LOW (ref 6.5–8.1)

## 2021-03-16 LAB — CBC WITH DIFFERENTIAL/PLATELET
Abs Immature Granulocytes: 0.04 10*3/uL (ref 0.00–0.07)
Basophils Absolute: 0 10*3/uL (ref 0.0–0.1)
Basophils Relative: 0 %
Eosinophils Absolute: 0 10*3/uL (ref 0.0–0.5)
Eosinophils Relative: 1 %
HCT: 23.8 % — ABNORMAL LOW (ref 39.0–52.0)
Hemoglobin: 7.6 g/dL — ABNORMAL LOW (ref 13.0–17.0)
Immature Granulocytes: 1 %
Lymphocytes Relative: 19 %
Lymphs Abs: 0.8 10*3/uL (ref 0.7–4.0)
MCH: 36.4 pg — ABNORMAL HIGH (ref 26.0–34.0)
MCHC: 31.9 g/dL (ref 30.0–36.0)
MCV: 113.9 fL — ABNORMAL HIGH (ref 80.0–100.0)
Monocytes Absolute: 0.4 10*3/uL (ref 0.1–1.0)
Monocytes Relative: 11 %
Neutro Abs: 2.7 10*3/uL (ref 1.7–7.7)
Neutrophils Relative %: 68 %
Platelets: 185 10*3/uL (ref 150–400)
RBC: 2.09 MIL/uL — ABNORMAL LOW (ref 4.22–5.81)
RDW: 17.1 % — ABNORMAL HIGH (ref 11.5–15.5)
WBC: 4 10*3/uL (ref 4.0–10.5)
nRBC: 0 % (ref 0.0–0.2)

## 2021-03-16 LAB — IRON AND TIBC
Iron: 62 ug/dL (ref 45–182)
Saturation Ratios: 45 % — ABNORMAL HIGH (ref 17.9–39.5)
TIBC: 139 ug/dL — ABNORMAL LOW (ref 250–450)
UIBC: 77 ug/dL

## 2021-03-16 LAB — LACTIC ACID, PLASMA
Lactic Acid, Venous: 4.1 mmol/L (ref 0.5–1.9)
Lactic Acid, Venous: 3.4 mmol/L (ref 0.5–1.9)

## 2021-03-16 LAB — HEMOGLOBIN A1C
Hgb A1c MFr Bld: 5.7 % — ABNORMAL HIGH (ref 4.8–5.6)
Mean Plasma Glucose: 116.89 mg/dL

## 2021-03-16 LAB — RETICULOCYTES
Immature Retic Fract: 25.9 % — ABNORMAL HIGH (ref 2.3–15.9)
RBC.: 2.08 MIL/uL — ABNORMAL LOW (ref 4.22–5.81)
Retic Count, Absolute: 63.4 10*3/uL (ref 19.0–186.0)
Retic Ct Pct: 3.1 % (ref 0.4–3.1)

## 2021-03-16 LAB — HEMOGLOBIN AND HEMATOCRIT, BLOOD
HCT: 24.5 % — ABNORMAL LOW (ref 39.0–52.0)
Hemoglobin: 7.9 g/dL — ABNORMAL LOW (ref 13.0–17.0)

## 2021-03-16 LAB — VITAMIN B12: Vitamin B-12: 457 pg/mL (ref 180–914)

## 2021-03-16 LAB — FOLATE: Folate: 4.8 ng/mL — ABNORMAL LOW (ref >5.9)

## 2021-03-16 LAB — FERRITIN: Ferritin: 325 ng/mL (ref 24–336)

## 2021-03-16 LAB — MAGNESIUM: Magnesium: 2 mg/dL (ref 1.7–2.4)

## 2021-03-16 LAB — PHOSPHORUS: Phosphorus: 3 mg/dL (ref 2.5–4.6)

## 2021-03-16 LAB — LIPASE, BLOOD: Lipase: 90 U/L — ABNORMAL HIGH (ref 11–51)

## 2021-03-16 MED ORDER — GADOBUTROL 1 MMOL/ML IV SOLN
7.0000 mL | Freq: Once | INTRAVENOUS | Status: AC | PRN
  Administered 2021-03-16: 14:00:00 7 mL via INTRAVENOUS

## 2021-03-16 MED ORDER — ADULT MULTIVITAMIN W/MINERALS CH
1.0000 | ORAL_TABLET | Freq: Every day | ORAL | Status: DC
  Administered 2021-03-17: 09:00:00 1 via ORAL
  Filled 2021-03-16: qty 1

## 2021-03-16 MED ORDER — METRONIDAZOLE IN NACL 5-0.79 MG/ML-% IV SOLN
500.0000 mg | Freq: Three times a day (TID) | INTRAVENOUS | Status: DC
  Administered 2021-03-16 – 2021-03-17 (×3): 500 mg via INTRAVENOUS
  Filled 2021-03-16 (×5): qty 100

## 2021-03-16 MED ORDER — SODIUM CHLORIDE 0.9 % IV SOLN
2.0000 g | INTRAVENOUS | Status: DC
  Administered 2021-03-16: 14:00:00 2 g via INTRAVENOUS
  Filled 2021-03-16: qty 2
  Filled 2021-03-16: qty 20

## 2021-03-16 MED ORDER — ENSURE ENLIVE PO LIQD
237.0000 mL | Freq: Two times a day (BID) | ORAL | Status: DC
  Administered 2021-03-17: 237 mL via ORAL

## 2021-03-16 MED ORDER — PANTOPRAZOLE SODIUM 40 MG PO TBEC
40.0000 mg | DELAYED_RELEASE_TABLET | Freq: Two times a day (BID) | ORAL | Status: DC
  Administered 2021-03-16 – 2021-03-17 (×2): 40 mg via ORAL
  Filled 2021-03-16 (×2): qty 1

## 2021-03-16 MED ORDER — TECHNETIUM TC 99M MEBROFENIN IV KIT
7.4990 | PACK | Freq: Once | INTRAVENOUS | Status: AC | PRN
  Administered 2021-03-16: 7.499 via INTRAVENOUS

## 2021-03-16 NOTE — Progress Notes (Signed)
Patient ID: Lee Johnson, male   DOB: Jul 19, 1941, 80 y.o.   MRN: [id number]     SURGICAL FOLLOW UP NOTE   Hospital Day(s): 1.   Post op day(s):  Marland Kitchen   Interval History: Patient seen and examined, no acute events or new complaints since last evaluation.  Patient just came back from HIDA scan.  Patient had MRCP and HIDA scan.  Patient denies any pain.  There is H.  There is no alleviating or rating factor.  Patient denies any nausea or vomiting at this moment.  Patient is alert.  Vital signs in last 24 hours: [min-max] current  Temp:  [98 F (36.7 C)-98.7 F (37.1 C)] 98 F (36.7 C) (03/23 1650) Pulse Rate:  [43-77] 69 (03/23 1650) Resp:  [16-18] 16 (03/23 1650) BP: (98-146)/(49-82) 124/82 (03/23 1650) SpO2:  [100 %] 100 % (03/23 1650) Weight:  [73 kg] 73 kg (03/22 1852)     Height: 5\' 10"  (177.8 cm) Weight: 73 kg BMI (Calculated): 23.09   Physical Exam:  Constitutional: alert, cooperative and no distress  Gastrointestinal: soft, non-tender, and non-distended  Labs:  CBC Latest Ref Rng & Units 03/16/2021 03/16/2021 03/15/2021  WBC 4.0 - 10.5 K/uL - 4.0 5.7  Hemoglobin 13.0 - 17.0 g/dL 7.9(L) 7.6(L) 8.9(L)  Hematocrit 39.0 - 52.0 % 24.5(L) 23.8(L) 27.7(L)  Platelets 150 - 400 K/uL - 185 269   CMP Latest Ref Rng & Units 03/16/2021 03/15/2021 04/19/2020  Glucose 70 - 99 mg/dL 04/21/2020) 026(V) 93  BUN 8 - 23 mg/dL 22 785(Y) 10  Creatinine 0.61 - 1.24 mg/dL 85(O 2.77 4.12  Sodium 135 - 145 mmol/L 139 133(L) 127(L)  Potassium 3.5 - 5.1 mmol/L 4.4 4.2 3.6  Chloride 98 - 111 mmol/L 100 92(L) 94(L)  CO2 22 - 32 mmol/L 31 27 24   Calcium 8.9 - 10.3 mg/dL 8.78) 9.5 8.0(L)  Total Protein 6.5 - 8.1 g/dL 5.1(L) 6.0(L) -  Total Bilirubin 0.3 - 1.2 mg/dL 4.5(H) 5.2(H) -  Alkaline Phos 38 - 126 U/L 133(H) 162(H) -  AST 15 - 41 U/L 54(H) 69(H) -  ALT 0 - 44 U/L 50(H) 61(H) -    Imaging studies: I personally evaluated the MRCP that was done today.  I do not see any stones in the gallbladder.  I do  not see any stones in the common bile duct.  There is no biliary ducts dilation.  I personally evaluated the HIDA scan done today.  There is adequate uptake of the gallbladder.  I personally evaluated the MRI done in 2017.  MRCP at that time very similar to today's MRCP.   Assessment/Plan:  Patient reevaluated.  I had a long discussion with the patient's wife and the patient was present.  From the surgical standpoint there is no sign of obstructive jaundice.  There is no stones in the bladder.  HIDA scan negative for cholecystitis.  I would not recommend any surgical intervention.  Of note I was evaluating the patient's chart and on 2017 he had an admission with very similar presentation.  During that admission he had an ultrasound of the abdomen and an MRCP due to elevated bilirubin and liver enzymes.  Both MRCP this shows the same finding of mildly inflammation of the pancreas without any sign of biliary duct obstruction.  There is no stone appreciated on the ultrasound done in 2017.  Today's ultrasound had a questionable stone but MRCP also negative for cholelithiasis.  I do not think that patient's jaundice is  caused by any gallbladder etiology.  At that time it was assessed that he was medically induced jaundice.  No surgical management during during this admission.  Will sign off case.  This follow-up encounter was more than 30-minute most of the time counseling the patient and his family and coordinating plan of care.  Gae Gallop, MD

## 2021-03-16 NOTE — Progress Notes (Signed)
PROGRESS NOTE  Lee Johnson ZOX:[id number] DOB: 1940/12/26 DOA: 03/15/2021 PCP: Dione Housekeeper, MD  HPI/Recap of past 24 hours: Lee Johnson is a 80 y.o. male with medical history significant for polymyositis, pulmonary fibrosis, chronic hypoxemia on 4L , essential hypertension, DM2, polyneuropathy, hyperlipidemia who presented to [facility] ED as a referral from his primary care provider due to worsening generalized weakness of 1 month duration acutely worsened in the last few days.  Patient is a poor historian.  His wife at bedside reports that 2 days ago she noticed jaundice, icteric sclera.  They went to his PCP yesterday who recommended to go to the ED for further evaluation.  Metformin was discontinued and his prednisone dose was reduced to 7.5 mg daily.  No other changes in his medications.  Per his wife he had a similar appearance about 4 days ago and it was thought that it was secondary to [facility] which was discontinued at that time.  Patient stopped taking CellCept in August 2021 due to hypomagnesemia and concern for arrhythmia.  Patient denies having any significant abdominal pain.  Denies having any diarrhea.  Denies dysuria or fevers.  Per his wife at bedside his mentation is at baseline.  He presented to the ED for further evaluation.  In the ED work-up revealed acute transaminitis and hyperbilirubinemia.  Right upper quadrant abdominal ultrasound  done on 03/15/2021 revealed gallstones and possible cholecystitis.  HIDA scan ordered and pending.  GI and general surgery consulted.  GI recommended MRCP which has been ordered and is pending.  03/16/21: Patient was seen and examined with his wife at his bedside.  He denies any nausea at the time of this visit.  Denies any significant abdominal pain.  Main complaint is with generalized weakness.   Assessment/Plan: Active Problems:   Transaminitis  Acute transaminitis in the setting of gallstone and possible cholecystitis on right  upper quadrant abdominal ultrasound done on 03/15/2021.   His home Imuran was held on admission due to hepatotoxicity as a possible side effect. Will defer to GI and general surgery on when to restart Imuran. Presented with elevated liver chemistries, T bili 5.2. Liver chemistries are downtrending. Continue to avoid hepatotoxic agents. GI and general surgery have been consulted GI recommended MRCP, ordered and pending.  Suspected acute cholecystitis Right upper quadrant abdominal ultrasound done on 03/15/2021 showed gallstones and gallbladder wall thickening. HIDA scan has been ordered and is pending.  Worsening lactic acidosis Presented with lactic acid 5.0, uptrending to 5.3, uptrending to 5.4 Increased rate of IV fluid hydration, normal saline at 100 cc/h. Continue Rocephin, increased dose to 2 g daily, add IV Flagyl. Repeat lactic acid  Hyperbilirubinemia, in the setting of gallstones. No bile duct dilatation no right upper quadrant abdominal ultrasound. T bilirubin is downtrending. Follow MRCP.  Generalized weakness likely multifactorial secondary to hypomagnesemia, acute illness, dehydration, poor oral intake, possible UTI Reports worsening acutely in the last few days. Replete electrolytes, treat underlying conditions PT OT to assess Fall precautions  Possible UTI He denies dysuria UA positive nitrite and WBC Started Rocephin empirically on admission. Continue to follow urine culture and blood cultures. Continue IV fluid hydration.  Resolved post repletion: Hypovolemic hyponatremia Presented with serum sodium 133 Currently on normal saline IV fluid hydration. Repeated serum sodium this morning 139.  Essential hypertension BP is currently at goal. Continue to hold off home HCTZ due to recent hyponatremia. Continue home enalapril. Continue to closely monitor vital signs.  Type 2 diabetes with  hyperglycemia Obtain hemoglobin A1c Start insulin sliding  scale Hold off home oral hypoglycemics  Resolved post repletion: Hypomagnesemia Serum magnesium 1.6, repeat 2.0. Replete intravenously.  Elevated lipase, downtrending in the setting of gallstone. Presents with lipase 94, repeat 90. Follow MRCP.  Acute on chronic anemia of chronic disease Baseline hemoglobin appears to be 11 Presented with hemoglobin of 8.9 and MCV 114 Hemoglobin downtrending. Obtain iron studies, B12 and folic acid level. Monitor H&H  History of polymyositis/history of pulmonary fibrosis Hold off home Imuran due to possible hepatotoxicity as a side effect. Resume home prednisone. Contact his rheumatologist in the morning. Home Ofev was discontinued 4 years ago due to jaundice Home CellCept was discontinued in August 2021 due to hypomagnesemia.   DVT prophylaxis: Subcu Lovenox daily.  Code Status: DNR as stated by the patient himself.  His wife at bedside.  Family Communication: His wife at bedside.  All questions answered to the best of my ability.  Disposition Plan: Admit to MedSurg unit with remote telemetry.  Consults called: GI.  General surgery.  Admission status: Inpatient status.  Patient will require at least 2 midnights for further evaluation and treatment of present condition.   Status is: Inpatient   Dispo:             Patient From: Home             Planned Disposition: Home  Anticipated on discharge 03/17/2021 or when GI and general surgery sign off.             Medically stable for discharge: No, ongoing management of acute transaminitis, hyperbilirubinemia, lactic acidosis.               Objective: Vitals:   03/15/21 1645 03/15/21 1852 03/16/21 0431 03/16/21 0812  BP:  (!) 146/64 (!) 98/49 127/78  Pulse: (!) 55 77 (!) 53 75  Resp: 17  16 18   Temp:  98.7 F (37.1 C) 98.1 F (36.7 C) 98.3 F (36.8 C)  TempSrc:  Oral  Oral  SpO2: 100% 100% 100% 100%  Weight:  73 kg    Height:  5\' 10"  (1.778 m)       Intake/Output Summary (Last 24 hours) at 03/16/2021 1011 Last data filed at 03/16/2021 0400 Gross per 24 hour  Intake 600 ml  Output 500 ml  Net 100 ml   Filed Weights   03/15/21 1000 03/15/21 1852  Weight: 77.1 kg 73 kg    Exam:  . General: 80 y.o. year-old male weak appearing, frail in no acute distress.  Alert and interactive. . Cardiovascular: Regular rate and rhythm with no rubs or gallops.  No thyromegaly or JVD noted.   Marland Kitchen. Respiratory: Clear to auscultation with no wheezes or rales. Good inspiratory effort. . Abdomen: Soft nontender nondistended with normal bowel sounds x4 quadrants. . Musculoskeletal: No lower extremity edema. 2/4 pulses in all 4 extremities. . Skin: No ulcerative lesions noted or rashes, jaundice.  Icteric sclera. . Psychiatry: Mood is appropriate for condition and setting   Data Reviewed: CBC: Recent Labs  Lab 03/15/21 1010 03/16/21 0513  WBC 5.7 4.0  NEUTROABS  --  2.7  HGB 8.9* 7.6*  HCT 27.7* 23.8*  MCV 114.0* 113.9*  PLT 269 185   Basic Metabolic Panel: Recent Labs  Lab 03/15/21 1010 03/16/21 0513  NA 133* 139  K 4.2 4.4  CL 92* 100  CO2 27 31  GLUCOSE 179* 104*  BUN 30* 22  CREATININE 1.17 0.99  CALCIUM 9.5  8.5*  MG 1.6* 2.0  PHOS  --  3.0   GFR: Estimated Creatinine Clearance: 62.5 mL/min (by C-G formula based on SCr of 0.99 mg/dL). Liver Function Tests: Recent Labs  Lab 03/15/21 1010 03/16/21 0513  AST 69* 54*  ALT 61* 50*  ALKPHOS 162* 133*  BILITOT 5.2* 4.5*  PROT 6.0* 5.1*  ALBUMIN 3.0* 2.4*   Recent Labs  Lab 03/15/21 1010 03/16/21 0513  LIPASE 94* 90*   No results for input(s): AMMONIA in the last 168 hours. Coagulation Profile: No results for input(s): INR, PROTIME in the last 168 hours. Cardiac Enzymes: Recent Labs  Lab 03/15/21 1010  CKTOTAL 30*   BNP (last 3 results) No results for input(s): PROBNP in the last 8760 hours. HbA1C: No results for input(s): HGBA1C in the last 72  hours. CBG: Recent Labs  Lab 03/15/21 1629 03/15/21 2150 03/16/21 0811  GLUCAP 179* 158* 120*   Lipid Profile: No results for input(s): CHOL, HDL, LDLCALC, TRIG, CHOLHDL, LDLDIRECT in the last 72 hours. Thyroid Function Tests: No results for input(s): TSH, T4TOTAL, FREET4, T3FREE, THYROIDAB in the last 72 hours. Anemia Panel: No results for input(s): VITAMINB12, FOLATE, FERRITIN, TIBC, IRON, RETICCTPCT in the last 72 hours. Urine analysis:    Component Value Date/Time   COLORURINE AMBER (A) 03/15/2021 1145   APPEARANCEUR CLEAR (A) 03/15/2021 1145   LABSPEC 1.016 03/15/2021 1145   PHURINE 6.0 03/15/2021 1145   GLUCOSEU NEGATIVE 03/15/2021 1145   HGBUR NEGATIVE 03/15/2021 1145   BILIRUBINUR NEGATIVE 03/15/2021 1145   KETONESUR 5 (A) 03/15/2021 1145   PROTEINUR NEGATIVE 03/15/2021 1145   NITRITE POSITIVE (A) 03/15/2021 1145   LEUKOCYTESUR TRACE (A) 03/15/2021 1145   Sepsis Labs: @LABRCNTIP (procalcitonin:4,lacticidven:4)  ) Recent Results (from the past 240 hour(s))  Blood culture (routine x 2)     Status: None (Preliminary result)   Collection Time: 03/15/21 10:10 AM   Specimen: BLOOD  Result Value Ref Range Status   Specimen Description BLOOD RIGHT AC  Final   Special Requests   Final    BOTTLES DRAWN AEROBIC AND ANAEROBIC Blood Culture adequate volume   Culture   Final    NO GROWTH < 24 HOURS Performed at [facility], [address] [place], [place]    Report Status PENDING  Incomplete  Blood culture (routine x 2)     Status: None (Preliminary result)   Collection Time: 03/15/21 11:45 AM   Specimen: BLOOD  Result Value Ref Range Status   Specimen Description BLOOD Blood Culture adequate volume  Final   Special Requests BOTTLES DRAWN AEROBIC AND ANAEROBIC FOREARM  Final   Culture   Final    NO GROWTH < 24 HOURS Performed at [facility], [address] [place], [place]    Report Status PENDING  Incomplete  Resp Panel  by RT-PCR (Flu A&B, Covid) Nasopharyngeal Swab     Status: None   Collection Time: 03/15/21 11:50 AM   Specimen: Nasopharyngeal Swab; Nasopharyngeal(NP) swabs in vial transport medium  Result Value Ref Range Status   SARS Coronavirus 2 by RT PCR NEGATIVE NEGATIVE Final    Comment: (NOTE) SARS-CoV-2 target nucleic acids are NOT DETECTED.  The SARS-CoV-2 RNA is generally detectable in upper respiratory specimens during the acute phase of infection. The lowest concentration of SARS-CoV-2 viral copies this assay can detect is 138 copies/mL. A negative result does not preclude SARS-Cov-2 infection and should not be used as the sole basis for treatment or other  patient management decisions. A negative result may occur with  improper specimen collection/handling, submission of specimen other than nasopharyngeal swab, presence of viral mutation(s) within the areas targeted by this assay, and inadequate number of viral copies(<138 copies/mL). A negative result must be combined with clinical observations, patient history, and epidemiological information. The expected result is Negative.  Fact Sheet for Patients:  BloggerCourse.com  Fact Sheet for Healthcare Providers:  SeriousBroker.it  This test is no t yet approved or cleared by the [place] FDA and  has been authorized for detection and/or diagnosis of SARS-CoV-2 by FDA under an Emergency Use Authorization (EUA). This EUA will remain  in effect (meaning this test can be used) for the duration of the COVID-19 declaration under Section 564(b)(1) of the Act, 21 U.S.C.section 360bbb-3(b)(1), unless the authorization is terminated  or revoked sooner.       Influenza A by PCR NEGATIVE NEGATIVE Final   Influenza B by PCR NEGATIVE NEGATIVE Final    Comment: (NOTE) The Xpert Xpress SARS-CoV-2/FLU/RSV plus assay is intended as an aid in the diagnosis of influenza from Nasopharyngeal swab  specimens and should not be used as a sole basis for treatment. Nasal washings and aspirates are unacceptable for Xpert Xpress SARS-CoV-2/FLU/RSV testing.  Fact Sheet for Patients: BloggerCourse.com  Fact Sheet for Healthcare Providers: SeriousBroker.it  This test is not yet approved or cleared by the [place] FDA and has been authorized for detection and/or diagnosis of SARS-CoV-2 by FDA under an Emergency Use Authorization (EUA). This EUA will remain in effect (meaning this test can be used) for the duration of the COVID-19 declaration under Section 564(b)(1) of the Act, 21 U.S.C. section 360bbb-3(b)(1), unless the authorization is terminated or revoked.  Performed at [facility], [address] [place], [place] [address]       Studies: DG Chest 2 View  Result Date: 03/15/2021 CLINICAL DATA:  Cough and weakness EXAM: CHEST - 2 VIEW COMPARISON:  05/18/2016 FINDINGS: Cardiac shadow is enlarged but stable. Aortic calcifications are noted. Lungs are well aerated bilaterally with bibasilar scarring and diffuse fibrotic changes. No acute focal infiltrate or effusion is noted. No acute bony abnormality is noted. IMPRESSION: Progressive fibrotic changes and scarring in the bases when compared with the prior exam. Electronically Signed   By: Alcide Clever M.D.   On: 03/15/2021 11:18   CT Head Wo Contrast  Result Date: 03/15/2021 CLINICAL DATA:  Transient ischemic attack (TIA) Right arm weakness EXAM: CT HEAD WITHOUT CONTRAST TECHNIQUE: Contiguous axial images were obtained from the base of the skull through the vertex without intravenous contrast. COMPARISON:  None. FINDINGS: Brain: No evidence of acute large vascular territory infarction, hemorrhage, hydrocephalus, extra-axial collection or mass lesion/mass effect. Age related global parenchymal volume loss with ex vacuo dilatation of ventricular system. There are scattered  and more confluent hypodense subcortical and periventricular white matter hypodensities which are nonspecific but likely represent a mild burden of chronic ischemic white matter disease. Mineralization left basal ganglia. Vascular: No hyperdense vessel. Atherosclerotic calcifications of the intracranial portions of the internal and vertebral arteries. Skull: Unremarkable.  Negative for fracture or focal lesion. Sinuses/Orbits: Paranasal sinuses and mastoid air cells are predominantly clear. Other: Cerumen in bilateral internal auditory canals. IMPRESSION: 1. No acute intracranial findings. 2. Age related global parenchymal volume loss with mild burden of chronic ischemic white matter disease. Electronically Signed   By: Maudry Mayhew MD   On: 03/15/2021 13:04   US ABDOMEN LIMITED RUQ (LIVER/GB)  Result  Date: 03/15/2021 CLINICAL DATA:  Elevated LFTs. EXAM: ULTRASOUND ABDOMEN LIMITED RIGHT UPPER QUADRANT COMPARISON:  MRCP 05/19/2016. FINDINGS: Gallbladder: Question 3 mm gallstone. Prominent thickening of the gallbladder wall at 5.5 mm. Negative Murphy sign. Common bile duct: Diameter: 2.5 mm Liver: No focal lesion identified. Within normal limits in parenchymal echogenicity. Portal vein is patent on color Doppler imaging with normal direction of blood flow towards the liver. Other: None. IMPRESSION: Question 3 mm gallstone. Prominent thickening of the gallbladder wall at 5.5 mm. Gallbladder wall thickening can be secondary to cholecystitis and or hypoproteinemic states. Negative Murphy sign. No biliary distention. Electronically Signed   By: Maisie Fus  Register   On: 03/15/2021 16:15    Scheduled Meds: . enalapril  5 mg Oral Daily  . enoxaparin (LOVENOX) injection  40 mg Subcutaneous Q24H  . insulin aspart  0-5 Units Subcutaneous QHS  . insulin aspart  0-9 Units Subcutaneous TID WC  . magnesium oxide  400 mg Oral BID  . ondansetron (ZOFRAN) IV  4 mg Intravenous Once  . pantoprazole  40 mg Oral Daily  .  predniSONE  7.5 mg Oral Daily  . tamsulosin  0.4 mg Oral QPC supper    Continuous Infusions: . sodium chloride 100 mL/hr at 03/16/21 0833  . cefTRIAXone (ROCEPHIN)  IV       LOS: 1 day     Darlin Drop, MD [facility] Pager [phone]  If 7PM-7AM, please contact night-coverage [email or web address] Password [facility] 03/16/2021, 10:11 AM

## 2021-03-16 NOTE — Consult Note (Signed)
GI Inpatient Consult Note  Reason for Consult: Transaminitis and cholestasis, suspected acute cholecystitis   Attending Requesting Consult: Dr. Irene Pap, DO  History of Present Illness: Lee Johnson is a 80 y.o. male seen for evaluation of transaminitis and cholestasis in setting of suspected acute cholecystitis at the request of Dr. Irene Pap, DO. Pt has a PMH of polymyositis, pulmonary fibrosis, chronic hypoxemia on 4L O2 via nasal canula, HTN, T2DM with polyneuropathy, and HLD. He presented to the [facility] ED yesterday for chief complaint of worsening generalized weakness and painless jaundice. Upon presentation to the ED, he was afebrile with otherwise stable vital signs. Labs were significant for transaminitis with AST 69, ALT 61 and cholestasis with tbili 5.2 and alk phos 162, and elevated lactic acid 5.3 with normal WBC. Lipase minimally elevated at 94. RUQ Ultrasound was performed and showed 3 mm gallstone and prominent thickening of gallbladder wall at 5.5 mm with negative Murphy's sign. There was no extrahepatic biliary dilatation mentioned. There was no complaint of abdominal pain, nausea, or vomiting. His only complaint is progressive generalized weakness over the past month and a half. GI was consulted in context of elevated liver enzymes and painless jaundice.   Patient seen and examined this afternoon resting comfortably in hospital bed. Wife present and helps answer all questions. He denies any abdominal pain, nausea, or vomiting. A CMP was repeated this morning and shows downtrending transaminitis and cholestasis - AST 54, ALT 50, alk phos 133, and tbili 4.5. The bilirubin has not been fractionated. Prior to this hospitalization the only medication changes were the discontinuation of Metformin and prednisone dose was reduced to 7.5 mg daily. No other acute complaints or concerns.    Last Colonoscopy:  05/25/2016 - sigmoid diverticulosis, Grade II non-bleeding IH  Last Endoscopy:   05/25/2016 - normal esophagus, gastritis, normal duodenum   Past Medical History:  Past Medical History:  Diagnosis Date  . Diabetes mellitus without complication ([place])   . Hypertension   . Polymyositis ([place])   . Pulmonary fibrosis ([place])     Problem List: Patient Active Problem List   Diagnosis Date Noted  . Transaminitis 03/15/2021  . Generalized weakness 04/18/2020  . Fall 04/18/2020  . Hyponatremia 04/18/2020  . Elevated troponin 04/18/2020  . Elevated lactic acid level 04/18/2020  . Nausea vomiting and diarrhea 04/18/2020  . Diabetes mellitus without complication ([place])   . Hypertension   . Polymyositis ([place])   . Pulmonary fibrosis ([place])   . Acute posthemorrhagic anemia   . Gastritis   . Second degree hemorrhoids   . Diverticulosis of large intestine without diverticulitis   . Acute hepatitis   . Jaundice 05/18/2016    Past Surgical History: Past Surgical History:  Procedure Laterality Date  . COLONOSCOPY WITH PROPOFOL N/A 05/25/2016   Procedure: COLONOSCOPY WITH PROPOFOL;  Surgeon: Lucilla Lame, MD;  Location: [facility] ENDOSCOPY;  Service: Endoscopy;  Laterality: N/A;  . ESOPHAGOGASTRODUODENOSCOPY (EGD) WITH PROPOFOL N/A 05/25/2016   Procedure: ESOPHAGOGASTRODUODENOSCOPY (EGD) WITH PROPOFOL;  Surgeon: Lucilla Lame, MD;  Location: [facility] ENDOSCOPY;  Service: Endoscopy;  Laterality: N/A;  . EYE SURGERY    . HERNIA REPAIR    . TESTICLE REMOVAL      Allergies: Allergies  Allergen Reactions  . Atorvastatin     Other reaction(s): Muscle Pain Other reaction(s): Muscle Pain Other reaction(s): Muscle Pain Other reaction(s): Muscle Pain  . Statins Other (See Comments)    Polymyositis. 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    . Montelukast  Other reaction(s): Other (See Comments)  . Montelukast Sodium     Other reaction(s): Unknown  . Other Diarrhea  . Sulfa Antibiotics Diarrhea  . Sulfamethoxazole-Trimethoprim Diarrhea    Home Medications: Medications Prior to Admission   Medication Sig Dispense Refill Last Dose  . Acetaminophen 500 MG coapsule Take 500 mg by mouth daily as needed.    prn at prn  . alendronate (FOSAMAX) 70 MG tablet Take 70 mg by mouth once a week.   Past Week at Unknown time  . aspirin EC 81 MG tablet Take 81 mg by mouth daily.    03/14/2021 at Unknown time  . azaTHIOprine (IMURAN) 50 MG tablet Take 125 mg by mouth daily.    03/15/2021 at 0800  . Calcium Carbonate-Vitamin D 600-400 MG-UNIT tablet Take 1 tablet by mouth daily.    03/15/2021 at 0800  . Cholecalciferol 25 MCG (1000 UT) tablet Take 1,000 Units by mouth daily.    03/15/2021 at 0800  . enalapril-hydrochlorothiazide (VASERETIC) 10-25 MG tablet Take 0.5 tablets by mouth daily.   03/14/2021 at Unknown time  . esomeprazole (NEXIUM) 20 MG capsule Take 20 mg by mouth daily.    03/15/2021 at 0800  . Flaxseed, Linseed, (FLAXSEED OIL) 1000 MG CAPS Take 1 capsule by mouth 2 (two) times daily.    03/15/2021 at 0800  . glipiZIDE (GLUCOTROL XL) 10 MG 24 hr tablet Take 10 mg by mouth daily as needed. If sugar is higher > 150   prn at prn  . magnesium oxide (MAG-OX) 400 MG tablet Take 0-800 mg by mouth See admin instructions. 450 mg in the morning and 350 in the evening   03/15/2021 at Unknown time  . Omega-3 Fatty Acids (FISH OIL) 1000 MG CAPS Take 1 capsule by mouth 2 (two) times daily.   03/15/2021 at 0800  . predniSONE (DELTASONE) 5 MG tablet Take 4 tablets (20 mg total) by mouth daily. (Patient taking differently: Take 7.5 mg by mouth daily.) 30 tablet 0 03/15/2021 at Unknown time  . tamsulosin (FLOMAX) 0.4 MG CAPS capsule Take 0.4 mg by mouth daily after supper.   03/14/2021 at Unknown time   Home medication reconciliation was completed with the patient.   Scheduled Inpatient Medications:   . enalapril  5 mg Oral Daily  . enoxaparin (LOVENOX) injection  40 mg Subcutaneous Q24H  . insulin aspart  0-5 Units Subcutaneous QHS  . insulin aspart  0-9 Units Subcutaneous TID WC  . magnesium oxide  400 mg  Oral BID  . ondansetron (ZOFRAN) IV  4 mg Intravenous Once  . pantoprazole  40 mg Oral BID  . predniSONE  7.5 mg Oral Daily  . tamsulosin  0.4 mg Oral QPC supper    Continuous Inpatient Infusions:   . sodium chloride 100 mL/hr at 03/16/21 0833  . cefTRIAXone (ROCEPHIN)  IV 2 g (03/16/21 1416)  . metronidazole 500 mg (03/16/21 1124)    PRN Inpatient Medications:  ibuprofen  Family History: family history includes CAD in his mother.  The patient's family history is negative for inflammatory bowel disorders, GI malignancy, or solid organ transplantation.  Social History:   reports that he has quit smoking. He has never used smokeless tobacco. He reports that he does not drink alcohol and does not use drugs. The patient denies ETOH, tobacco, or drug use.   Review of Systems: Constitutional: Weight is stable.  Eyes: No changes in vision. ENT: No oral lesions, sore throat.  GI: see HPI.  Heme/Lymph: No  easy bruising.  CV: No chest pain.  GU: No hematuria.  Integumentary: No rashes.  Neuro: No headaches.  Psych: No depression/anxiety.  Endocrine: No heat/cold intolerance.  Allergic/Immunologic: No urticaria.  Resp: No cough, SOB.  Musculoskeletal: No joint swelling.    Physical Examination: BP 133/76 (BP Location: Right Arm)   Pulse (!) 43   Temp 98.5 F (36.9 C) (Oral)   Resp 18   Ht _0  (1.778 m)   Wt 73 kg   SpO2 100%   BMI 23.09 kg/m   Non-toxic appearing male laying supine on hospital bed. No accessory muscle use when breathing. Wife present and helps to answer all questions.  Gen: NAD, alert and oriented x 4 HEENT: PEERLA, EOMI, +sceral icterus, +jaundiced Neck: supple, no JVD or thyromegaly Chest: CTA bilaterally, no wheezes, crackles, or other adventitious sounds CV: RRR, no m/g/c/r Abd: soft, NT, ND, +BS in all four quadrants; no HSM, guarding, ridigity, or rebound tenderness Ext: no edema, well perfused with 2+ pulses, Skin: no rash or lesions  noted Lymph: no LAD  Data: Lab Results  Component Value Date   WBC 4.0 03/16/2021   HGB 7.9 (L) 03/16/2021   HCT 24.5 (L) 03/16/2021   MCV 113.9 (H) 03/16/2021   PLT 185 03/16/2021   Recent Labs  Lab 03/15/21 1010 03/16/21 0513 03/16/21 1032  HGB 8.9* 7.6* 7.9*   Lab Results  Component Value Date   NA 139 03/16/2021   K 4.4 03/16/2021   CL 100 03/16/2021   CO2 31 03/16/2021   BUN 22 03/16/2021   CREATININE 0.99 03/16/2021   Lab Results  Component Value Date   ALT 50 (H) 03/16/2021   AST 54 (H) 03/16/2021   ALKPHOS 133 (H) 03/16/2021   BILITOT 4.5 (H) 03/16/2021   No results for input(s): APTT, INR, PTT in the last 168 hours. 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 GI consulted in the context of mild transaminitis and cholestasis suspected 2/2 acute cholecystitis   1. Mild transaminitis and cholestasis in sitting of direct hyperbilirubinemia - unclear etiology at present time. Differential includes choledocholithiasis versus obstructive jaundice 2/2 neoplasm/choleangiosarcoma. Other etiologies include medication side effect from azathioprine, viral hepatitis, congestive hepatopathy, etc. Given his presentation with painless jaundice and unintentional weight loss, concern for underlying neoplasm is heightened. Cholangitis is deemed very unlikely given lack of abdominal pain and fever.   2. Cholelithiasis with possible cholecystitis - 3 mm gallstone  3. Unintentional weight loss - 10 lbs x 6 weeks per wife  4. Lactic acidosis - lactic acid 5.3  5. Progressive generalized weakness - likely multifactorial 2/2 dehydration, poor PO intake, acute illness  6. UTI - urine culture pending  7. Anemia of chronic disease    Recommendations:  - Agree with current management - Recommend MRCP today to rule out biliary dilatation, choledocholithiasis, and any underlying neoplasm to account for patient's presentation of painless jaundice - If evidence of choledocholithiasis, will plan on ERCP with Dr. Allen Norris - Continue to trend LFTs - Serial abdominal examinations  - Further recommendations after MRCP  - Following along with you    Thank you for the consult. Please call with questions or concerns.  Reeves Forth [facility] [phone] (Cell)

## 2021-03-16 NOTE — Progress Notes (Signed)
OT Cancellation Note  Patient Details Name: Lee Johnson MRN: [id number] DOB: October 10, 1941   Cancelled Treatment:    Reason Eval/Treat Not Completed: Medical issues which prohibited therapy. OT order received and chart reviewed. Lactic acid value of 5.4 yesterday and not re-drawn at this time. Pt on hold per therapy guideline. OT to follow up at next available time when pt is medically able to participate.   Jackquline Denmark, MS, OTR/L , CBIS ascom [phone]  03/16/21, 9:30 AM   03/16/2021, 9:27 AM

## 2021-03-16 NOTE — Consult Note (Signed)
SURGICAL CONSULTATION NOTE   HISTORY OF PRESENT ILLNESS (HPI):  80 y.o. male presented to [facility] ED for evaluation of weakness and jaundice. Patient report is poor historian but the wife was at bedside and gave the whole history.  The wife reported that the patient has been weaker during the last week.  She noticed he is eyes and skin jaundiced.  She went to see her primary care and there was a concern of UTI.  She came to the emergency room for further evaluation.  She reported that he was found with UTI with positive nitrate.  Urine culture in process.  He was also found with elevated lactic acid.  He was found with bilirubin of 5.  Alkaline phosphatase, AST ALT also elevated.  There is no leukocytosis but patient with neutropenia.  The wife denies any abdominal pain.  There has been no pain radiation, alleviating or aggravating factors.  At the ED he also had ultrasound of the abdomen that reported a questionable stone in the gallbladder.  I personally evaluated the images.  The images of the gallbladder were very difficult to appreciate any stones.  Repeated labs shows persistent elevated bilirubin, persisting elevated lactic acid.  Hospitalist, Dr. Margo Aye consulted GI and surgery for evaluation of elevated bilirubin.  PAST MEDICAL HISTORY (PMH):  Past Medical History:  Diagnosis Date  . Diabetes mellitus without complication (H[facility])   . Hypertension   . Polymyositis ([facility])   . Pulmonary fibrosis ([facility])      PAST SURGICAL HISTORY (PSH):  Past Surgical History:  Procedure Laterality Date  . COLONOSCOPY WITH PROPOFOL N/A 05/25/2016   Procedure: COLONOSCOPY WITH PROPOFOL;  Surgeon: Midge Minium, MD;  Location: [facility] ENDOSCOPY;  Service: Endoscopy;  Laterality: N/A;  . ESOPHAGOGASTRODUODENOSCOPY (EGD) WITH PROPOFOL N/A 05/25/2016   Procedure: ESOPHAGOGASTRODUODENOSCOPY (EGD) WITH PROPOFOL;  Surgeon: Midge Minium, MD;  Location: [facility] ENDOSCOPY;  Service: Endoscopy;  Laterality: N/A;  . EYE SURGERY    .  HERNIA REPAIR    . TESTICLE REMOVAL       MEDICATIONS:  Prior to Admission medications   Medication Sig Start Date End Date Taking? Authorizing Provider  Acetaminophen 500 MG coapsule Take 500 mg by mouth daily as needed.    Yes [provider]  alendronate (FOSAMAX) 70 MG tablet Take 70 mg by mouth once a week. 04/14/20  Yes [provider]  aspirin EC 81 MG tablet Take 81 mg by mouth daily.    Yes [provider]  azaTHIOprine (IMURAN) 50 MG tablet Take 125 mg by mouth daily.  02/13/20  Yes [provider]  Calcium Carbonate-Vitamin D 600-400 MG-UNIT tablet Take 1 tablet by mouth daily.    Yes [provider]  Cholecalciferol 25 MCG (1000 UT) tablet Take 1,000 Units by mouth daily.    Yes [provider]  enalapril-hydrochlorothiazide (VASERETIC) 10-25 MG tablet Take 0.5 tablets by mouth daily. 12/20/20  Yes [provider]  esomeprazole (NEXIUM) 20 MG capsule Take 20 mg by mouth daily.    Yes [provider]  Flaxseed, Linseed, (FLAXSEED OIL) 1000 MG CAPS Take 1 capsule by mouth 2 (two) times daily.    Yes [provider]  glipiZIDE (GLUCOTROL XL) 10 MG 24 hr tablet Take 10 mg by mouth daily as needed. If sugar is higher > 150 05/18/16  Yes [provider]  magnesium oxide (MAG-OX) 400 MG tablet Take 0-800 mg by mouth See admin instructions. 450 mg in the morning and 350 in the evening  Yes [provider]  Omega-3 Fatty Acids (FISH OIL) 1000 MG CAPS Take 1 capsule by mouth 2 (two) times daily.   Yes [provider]  predniSONE (DELTASONE) 5 MG tablet Take 4 tablets (20 mg total) by mouth daily. Patient taking differently: Take 7.5 mg by mouth daily. 05/27/16  Yes Enedina Finner, MD  tamsulosin (FLOMAX) 0.4 MG CAPS capsule Take 0.4 mg by mouth daily after supper. 04/08/20  Yes [provider]     ALLERGIES:  Allergies  Allergen Reactions  . Atorvastatin     Other reaction(s):  Muscle Pain Other reaction(s): Muscle Pain Other reaction(s): Muscle Pain Other reaction(s): Muscle Pain  . Statins Other (See Comments)    Polymyositis. 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    . Montelukast     Other reaction(s): Other (See Comments)  . Montelukast Sodium     Other reaction(s): Unknown  . Other Diarrhea  . Sulfa Antibiotics Diarrhea  . Sulfamethoxazole-Trimethoprim Diarrhea     SOCIAL HISTORY:  Social History   Socioeconomic History  . Marital status: Married    Spouse name: Not on file  . Number of children: Not on file  . Years of education: Not on file  . Highest education level: Not on file  Occupational History  . Not on file  Tobacco Use  . Smoking status: Former Games developer  . Smokeless tobacco: Never Used  Vaping Use  . Vaping Use: Never used  Substance and Sexual Activity  . Alcohol use: No  . Drug use: No  . Sexual activity: Never  Other Topics Concern  . Not on file  Social History Narrative  . Not on file   Social Determinants of Health   Financial Resource Strain: Not on file  Food Insecurity: Not on file  Transportation Needs: Not on file  Physical Activity: Not on file  Stress: Not on file  Social Connections: Not on file  Intimate Partner Violence: Not on file      FAMILY HISTORY:  Family History  Problem Relation Age of Onset  . CAD Mother      REVIEW OF SYSTEMS:  Constitutional: denies weight loss, fever, chills, or sweats  Eyes: denies any other vision changes, history of eye injury.  Positive for yellow sclera ENT: denies sore throat, hearing problems  Respiratory: denies shortness of breath, wheezing  Cardiovascular: denies chest pain, palpitations  Gastrointestinal: Negative abdominal pain, nausea and vomiting Genitourinary: denies burning with urination or urinary frequency Musculoskeletal: denies any other joint pains or cramps  Skin: denies any other rashes or skin discolorations  Neurological: denies any other headache,  dizziness, weakness  Psychiatric: denies any other depression, anxiety   All other review of systems were negative   VITAL SIGNS:  Temp:  [98.1 F (36.7 C)-98.7 F (37.1 C)] 98.5 F (36.9 C) (03/23 1202) Pulse Rate:  [43-79] 43 (03/23 1202) Resp:  [16-23] 18 (03/23 1202) BP: (98-146)/(49-80) 133/76 (03/23 1202) SpO2:  [100 %] 100 % (03/23 1202) Weight:  [73 kg] 73 kg (03/22 1852)     Height: 5\' 10"  (177.8 cm) Weight: 73 kg BMI (Calculated): 23.09   INTAKE/OUTPUT:  This shift: Total I/O In: 360 [P.O.:360] Out: -   Last 2 shifts: @IOLAST2SHIFTS @   PHYSICAL EXAM:  Constitutional:  -- Normal body habitus  -- Awake, alert, and oriented x3  Eyes:  -- Pupils equally round and reactive to light  -- No scleral icterus  Ear, nose, and throat:  -- No jugular venous distension  Pulmonary:  -- No crackles  -- Equal breath sounds bilaterally -- Breathing non-labored at rest Cardiovascular:  -- S1, S2 present  -- No pericardial rubs Gastrointestinal:  -- Abdomen soft, nontender, non-distended, no guarding or rebound tenderness -- No abdominal masses appreciated, pulsatile or otherwise  Musculoskeletal and Integumentary:  -- Wounds: None appreciated -- Extremities: B/L UE and LE FROM, hands and feet warm, no edema  Neurologic:  -- Motor function: intact and symmetric -- Sensation: intact and symmetric   Labs:  CBC Latest Ref Rng & Units 03/16/2021 03/16/2021 03/15/2021  WBC 4.0 - 10.5 K/uL - 4.0 5.7  Hemoglobin 13.0 - 17.0 g/dL 7.9(L) 7.6(L) 8.9(L)  Hematocrit 39.0 - 52.0 % 24.5(L) 23.8(L) 27.7(L)  Platelets 150 - 400 K/uL - 185 269   CMP Latest Ref Rng & Units 03/16/2021 03/15/2021 04/19/2020  Glucose 70 - 99 mg/dL 716(R) 678(L) 93  BUN 8 - 23 mg/dL 22 38(B) 10  Creatinine 0.61 - 1.24 mg/dL 0.17 5.10 2.58  Sodium 135 - 145 mmol/L 139 133(L) 127(L)  Potassium 3.5 - 5.1 mmol/L 4.4 4.2 3.6  Chloride 98 - 111 mmol/L 100 92(L) 94(L)  CO2 22 - 32 mmol/L 31 27 24   Calcium 8.9 -  10.3 mg/dL ) 9.5 8.0(L)  Total Protein 6.5 - 8.1 g/dL 5.1(L) 6.0(L) -  Total Bilirubin 0.3 - 1.2 mg/dL 4.5(H) 5.2(H) -  Alkaline Phos 38 - 126 U/L 133(H) 162(H) -  AST 15 - 41 U/L 54(H) 69(H) -  ALT 0 - 44 U/L 50(H) 61(H) -    Imaging studies:  EXAM: ULTRASOUND ABDOMEN LIMITED RIGHT UPPER QUADRANT  COMPARISON:  MRCP 05/19/2016.  FINDINGS: Gallbladder:  Question 3 mm gallstone. Prominent thickening of the gallbladder wall at 5.5 mm. Negative Murphy sign.  Common bile duct:  Diameter: 2.5 mm  Liver:  No focal lesion identified. Within normal limits in parenchymal echogenicity. Portal vein is patent on color Doppler imaging with normal direction of blood flow towards the liver.  Other: None.  IMPRESSION: Question 3 mm gallstone. Prominent thickening of the gallbladder wall at 5.5 mm. Gallbladder wall thickening can be secondary to cholecystitis and or hypoproteinemic states. Negative Murphy sign. No biliary distention.   Electronically Signed   By: 05/21/2016  Register   On: 03/15/2021 16:15  Assessment/Plan:  80 y.o. male with pain generalized, complicated by pertinent comorbidities including polymyositis complicated with pulmonary fibrosis, chronic hypoxemia on 4 L nasal cannula, hypertension, diabetes mellitus, polyneuropathy.  As per wife history and patient they deny abdominal pain.  He was found with elevated bilirubin.  There is questionable stone in the gallbladder.  I agree with ordered MRCP.  This will help identify the cause of the jaundice.  If patient had choledocholithiasis he will benefit of evaluation of possible ERCP.  Agree with current management.  I will continue to follow with MRCP results for further recommendations.  76, MD

## 2021-03-16 NOTE — Progress Notes (Signed)
Initial Nutrition Assessment  DOCUMENTATION CODES:   Not applicable  INTERVENTION:   -Ensure Enlive po BID, each supplement provides 350 kcal and 20 grams of protein -MVI with minerals daily  NUTRITION DIAGNOSIS:   Inadequate oral intake related to decreased appetite as evidenced by per patient/family report,meal completion < 25%.  GOAL:   Patient will meet greater than or equal to 90% of their needs  MONITOR:   PO intake,Supplement acceptance,Diet advancement,Labs,Weight trends,Skin,I & O's  REASON FOR ASSESSMENT:   Malnutrition Screening Tool    ASSESSMENT:   Lee Johnson is a 80 y.o. male with medical history significant for polymyositis, pulmonary fibrosis, chronic hypoxemia on 4L [place], essential hypertension, DM2, polyneuropathy, hyperlipidemia who presented to [facility] ED as a referral from his primary care provider due to worsening generalized weakness of 1 month duration acutely worsened in the last few days.  Pt admitted with acute transaminitis.   Reviewed I/O's: +100 ml x 24 hours  UOP: 500 ml x 24 hours  Spoke with pt and wife at bedside, who report a general decline in health over the past 6 months. She reports pt has been experiencing decreased oral intake due to decreased appetite and often requires a lot of encouragement to eat. Per pt, he consumes 2 meals per day (Breakfast: ham biscuit or eggs and coffe; Lunch: spaghetti OR soup OR meat, starch, and vegetable, Snack: peanut butter crackers or sandwich).   Per wife, pt has lost about 20 pounds over the past 6 months and 10 pounds over the past month. His UBW is around 194#. Per wt hx; pt has experienced a 8.5% wt loss over the past 11 months, which is not significant for time frame.   Discussed importance of meal and supplement intake to promote healing. Pt amenable to oral nutrition supplements.   Medications reviewed and include prednisone, magnesium oxide, and 0.9% sodium chloride infusion @ 100 ml/hr.    Labs reviewed: CBGS: 120-158 (inpatient orders for glycemic control are 0-9 units insulin aspart TID with meals and 0-5 units insulin aspart daily at bedtime).   NUTRITION - FOCUSED PHYSICAL EXAM:  Flowsheet Row Most Recent Value  Orbital Region No depletion  Upper Arm Region Mild depletion  Thoracic and Lumbar Region No depletion  Buccal Region No depletion  Temple Region No depletion  Clavicle Bone Region Moderate depletion  Clavicle and Acromion Bone Region Mild depletion  Scapular Bone Region Mild depletion  Dorsal Hand Mild depletion  Patellar Region Moderate depletion  Anterior Thigh Region Moderate depletion  Posterior Calf Region Moderate depletion  Edema (RD Assessment) None  Hair Reviewed  Eyes Reviewed  Mouth Reviewed  Skin Reviewed  Nails Reviewed       Diet Order:   Diet Order            Diet NPO time specified  Diet effective now                 EDUCATION NEEDS:   Education needs have been addressed  Skin:  Skin Assessment: Reviewed RN Assessment  Last BM:  03/16/21  Height:   Ht Readings from Last 1 Encounters:  03/15/21 5\' 10"  (1.778 m)    Weight:   Wt Readings from Last 1 Encounters:  03/15/21 73 kg    Ideal Body Weight:  75.5 kg  BMI:  Body mass index is 23.09 kg/m.  Estimated Nutritional Needs:   Kcal:  2000-2200  Protein:  105-120 grams  Fluid:  > 2 L  Loistine Chance, RD, LDN, [place] Registered Dietitian II Certified Diabetes Care and Education Specialist Please refer to [facility] for RD and/or RD on-call/weekend/after hours pager

## 2021-03-16 NOTE — Evaluation (Signed)
Physical Therapy Evaluation Patient Details Name: Lee Johnson MRN: [id number] DOB: [birth year] Today's Date: 03/16/2021   History of Present Illness  80 y.o. male with medical history significant for polymyositis, pulmonary fibrosis, chronic hypoxemia on 4L [place], essential hypertension, DM2, polyneuropathy, hyperlipidemia who presented to [facility] ED as a referral from his primary care provider due to worsening generalized weakness of 1 month, wife reports that really he has been quite limited for the last 6 months.  Clinical Impression  Pt generally anxious t/o the session, but was able to do some limited mobility and rise and take a few steps to the recliner.  Per wife he has not really done much more than w/c to commode/seat transfers for months and generally has been very weak and limited.  Apparently he has had issues with elevated HR during essentially all activity (even minimal); the same happened today with HR in the 110-120s most of the time in sitting with brief but not infrequent spikes into the 130s, 140s in sitting and standing and even the 150s during ambulation.  Pt's O2 remained in the 90s on 4L t/o the session but he was exhausted with the modest effort, hardly able to control descent onto the recliner after <5 ft of "walking."      Follow Up Recommendations SNF (they are not interested, state they have done HHPT in the past, have erg and)    Equipment Recommendations  None recommended by PT    Recommendations for Other Services       Precautions / Restrictions Precautions Precautions: Fall Restrictions Weight Bearing Restrictions: No      Mobility  Bed Mobility Overal bed mobility: Needs Assistance Bed Mobility: Sidelying to Sit   Sidelying to sit: Min assist       General bed mobility comments: pt able to do most of the transition to sitting, only needing light assist to facilitate    Transfers Overall transfer level: Needs assistance Equipment used: Rolling  walker (2 wheeled) Transfers: Sit to/from Stand Sit to Stand: Min assist         General transfer comment: Pt needing a lot of encouargement to motivate an attempt at standing.  He did need light assist to keep weight shifting forward, but with walker was able to maintain standing  Ambulation/Gait Ambulation/Gait assistance: Min assist;Mod assist Gait Distance (Feet): 5 Feet Assistive device: Rolling walker (2 wheeled)       General Gait Details: Pt was able to manage a few very hesitant, shuffling, walker reliant steps.  He fatigued extremely quickly and even the first half of this modest effort was much better and more stable than the second half of the effort.  He started to become anxious, stagger and reach for the recliner before he was even close to being squared up.  Pt unsafe and showed very poor tolerance  Stairs            Wheelchair Mobility    Modified Rankin (Stroke Patients Only)       Balance                                             Pertinent Vitals/Pain Pain Assessment: No/denies pain    Home Living Family/patient expects to be discharged to:: Private residence Living Arrangements: Spouse/significant other Available Help at Discharge: Family;Available 24 hours/day Type of Home: House Home Access: Stairs to  enter Entrance Stairs-Rails: None Entrance Stairs-Number of Steps:  (<4" threshold) Home Layout: One level Home Equipment: Walker - 4 wheels;Shower seat;Wheelchair - manual;Bedside commode      Prior Function Level of Independence: Needs assistance   Gait / Transfers Assistance Needed: Short household distances with rollator or manual w/c (pt's wife usually around whenever pt mobilizes for safety).  Apparently hasn't walked more than a few feet/transfers for months now.  Rarely out of the home           Hand Dominance        Extremity/Trunk Assessment   Upper Extremity Assessment Upper Extremity Assessment:  Generalized weakness    Lower Extremity Assessment Lower Extremity Assessment: Generalized weakness       Communication   Communication: No difficulties  Cognition Arousal/Alertness: Awake/alert Behavior During Therapy: Anxious Overall Cognitive Status: Within Functional Limits for tasks assessed                                 General Comments: appropriate, though somewhat confused (apparently at/near baseline)      General Comments General comments (skin integrity, edema, etc.): Pt's HR was an issue (and per wife HR quickly elevates anytime he does even modest activity).  At rest in supine his HR was <100, however in sitting it went up to ~130 and with seated rest fluctuated 110s-120s most of the time with spikes into the 130140 s, initiated standing/ambulation when in 110-120s range. During very brief bout of ambulation (little more than a stand/turn to recliner) his HR rose even more, even into the high 150s.  O2 remained in the mid 90s (on his baseline 4L) t/o the session    Exercises     Assessment/Plan    PT Assessment Patient needs continued PT services  PT Problem List Decreased strength;Decreased range of motion;Decreased mobility;Decreased balance;Decreased activity tolerance;Decreased safety awareness;Decreased knowledge of use of DME;Pain       PT Treatment Interventions DME instruction;Gait training;Functional mobility training;Therapeutic activities;Therapeutic exercise;Balance training;Patient/family education    PT Goals (Current goals can be found in the Care Plan section)  Acute Rehab PT Goals Patient Stated Goal: Go home PT Goal Formulation: With family Time For Goal Achievement: 03/30/21 Potential to Achieve Goals: Good    Frequency Min 2X/week   Barriers to discharge        Co-evaluation               AM-PAC PT "6 Clicks" Mobility  Outcome Measure Help needed turning from your back to your side while in a flat bed without  using bedrails?: A Little Help needed moving from lying on your back to sitting on the side of a flat bed without using bedrails?: A Little Help needed moving to and from a bed to a chair (including a wheelchair)?: A Lot Help needed standing up from a chair using your arms (e.g., wheelchair or bedside chair)?: A Lot Help needed to walk in hospital room?: A Lot Help needed climbing 3-5 steps with a railing? : A Lot 6 Click Score: 14    End of Session Equipment Utilized During Treatment: Gait belt;Oxygen (4L) Activity Tolerance: Patient limited by fatigue Patient left: in chair;with call bell/phone within reach;with family/visitor present Nurse Communication: Mobility status (elevated HR with activity) PT Visit Diagnosis: Muscle weakness (generalized) (M62.81);Difficulty in walking, not elsewhere classified (R26.2)    Time: 2202-5427 PT Time Calculation (min) (ACUTE ONLY): 28 min   Charges:  PT Evaluation $PT Eval Low Complexity: 1 Low PT Treatments $Therapeutic Activity: 8-22 mins        Malachi Pro, DPT 03/16/2021, 12:11 PM

## 2021-03-16 NOTE — Progress Notes (Signed)
Advance Directive Education. Prayer of comfort

## 2021-03-17 LAB — CK: Total CK: 43 U/L — ABNORMAL LOW (ref 49–397)

## 2021-03-17 LAB — COMPREHENSIVE METABOLIC PANEL
ALT: 51 U/L — ABNORMAL HIGH (ref 0–44)
AST: 54 U/L — ABNORMAL HIGH (ref 15–41)
Albumin: 2.5 g/dL — ABNORMAL LOW (ref 3.5–5.0)
Alkaline Phosphatase: 133 U/L — ABNORMAL HIGH (ref 38–126)
Anion gap: 7 (ref 5–15)
BUN: 13 mg/dL (ref 8–23)
CO2: 28 mmol/L (ref 22–32)
Calcium: 8.1 mg/dL — ABNORMAL LOW (ref 8.9–10.3)
Chloride: 102 mmol/L (ref 98–111)
Creatinine, Ser: 0.83 mg/dL (ref 0.61–1.24)
GFR, Estimated: >60 mL/min (ref >60)
Glucose, Bld: 144 mg/dL — ABNORMAL HIGH (ref 70–99)
Potassium: 3.8 mmol/L (ref 3.5–5.1)
Sodium: 137 mmol/L (ref 135–145)
Total Bilirubin: 4.3 mg/dL — ABNORMAL HIGH (ref 0.3–1.2)
Total Protein: 4.9 g/dL — ABNORMAL LOW (ref 6.5–8.1)

## 2021-03-17 LAB — CBC WITH DIFFERENTIAL/PLATELET
Abs Immature Granulocytes: 0.02 10*3/uL (ref 0.00–0.07)
Basophils Absolute: 0 10*3/uL (ref 0.0–0.1)
Basophils Relative: 1 %
Eosinophils Absolute: 0.1 10*3/uL (ref 0.0–0.5)
Eosinophils Relative: 2 %
HCT: 23.6 % — ABNORMAL LOW (ref 39.0–52.0)
Hemoglobin: 8 g/dL — ABNORMAL LOW (ref 13.0–17.0)
Immature Granulocytes: 1 %
Lymphocytes Relative: 17 %
Lymphs Abs: 0.7 10*3/uL (ref 0.7–4.0)
MCH: 37.6 pg — ABNORMAL HIGH (ref 26.0–34.0)
MCHC: 33.9 g/dL (ref 30.0–36.0)
MCV: 110.8 fL — ABNORMAL HIGH (ref 80.0–100.0)
Monocytes Absolute: 0.5 10*3/uL (ref 0.1–1.0)
Monocytes Relative: 13 %
Neutro Abs: 2.7 10*3/uL (ref 1.7–7.7)
Neutrophils Relative %: 66 %
Platelets: 183 10*3/uL (ref 150–400)
RBC: 2.13 MIL/uL — ABNORMAL LOW (ref 4.22–5.81)
RDW: 17.5 % — ABNORMAL HIGH (ref 11.5–15.5)
Smear Review: NORMAL
WBC: 3.9 10*3/uL — ABNORMAL LOW (ref 4.0–10.5)
nRBC: 0 % (ref 0.0–0.2)

## 2021-03-17 LAB — URINE CULTURE: Culture: >=100000 — AB

## 2021-03-17 LAB — GLUCOSE, CAPILLARY: Glucose-Capillary: 164 mg/dL — ABNORMAL HIGH (ref 70–99)

## 2021-03-17 MED ORDER — ENALAPRIL MALEATE 5 MG PO TABS
5.0000 mg | ORAL_TABLET | Freq: Every day | ORAL | 0 refills | Status: DC
Start: 2021-03-18 — End: 2021-10-08

## 2021-03-17 MED ORDER — AMOXICILLIN-POT CLAVULANATE 875-125 MG PO TABS: 1.0000 | ORAL_TABLET | Freq: Two times a day (BID) | ORAL | 0 refills | Status: AC

## 2021-03-17 MED ORDER — PREDNISONE 2.5 MG PO TABS: 7.5000 mg | ORAL_TABLET | Freq: Every day | ORAL | 0 refills | Status: AC

## 2021-03-17 MED ORDER — AMOXICILLIN-POT CLAVULANATE 875-125 MG PO TABS
1.0000 | ORAL_TABLET | Freq: Two times a day (BID) | ORAL | Status: DC
  Administered 2021-03-17: 1 via ORAL
  Filled 2021-03-17: qty 1

## 2021-03-17 MED ORDER — ENSURE ENLIVE PO LIQD: 237.0000 mL | Freq: Two times a day (BID) | ORAL | 0 refills | Status: AC

## 2021-03-17 NOTE — Progress Notes (Signed)
Gave discharge instructions to patient and wife at bedside. She agreed to make appointments as seen fit for patient's needs. He is resting in bed with call bell in reach. All items gathered. Awaiting transport to take patient out to car.

## 2021-03-17 NOTE — Plan of Care (Signed)
Pt Aox3-4, forgetful. C/o generalized pain but did not want PRN med overnight. NS infusing per MAR, ABX infused w/o issue. New PIV placed overnight. Turns self in bed. Remains on chronic 4L  humidified. Episode of HR into 150s reading a-fib on telemetry while standing to use urinal, pt asymptomatic and per wife has had extensive workup for a-fib which was all negative and cardiologists state it is due to over-exertion of lungs; quickly recovered to HR 90s after sitting back down. No BM overnight, adequate UO. Rounding performed, fall/safety precautions in place, needs/concerns addressed.  Problem: Education: Goal: Knowledge of General Education information will improve Description: Including pain rating scale, medication(s)/side effects and non-pharmacologic comfort measures 03/17/2021 0405 by Michail Sermon, RN Outcome: Progressing 03/17/2021 0404 by Michail Sermon, RN Outcome: Progressing   Problem: Health Behavior/Discharge Planning: Goal: Ability to manage health-related needs will improve 03/17/2021 0405 by Michail Sermon, RN Outcome: Progressing 03/17/2021 0404 by Michail Sermon, RN Outcome: Progressing   Problem: Clinical Measurements: Goal: Ability to maintain clinical measurements within normal limits will improve 03/17/2021 0405 by Michail Sermon, RN Outcome: Progressing 03/17/2021 0404 by Michail Sermon, RN Outcome: Progressing Goal: Will remain free from infection 03/17/2021 0405 by Michail Sermon, RN Outcome: Progressing 03/17/2021 0404 by Michail Sermon, RN Outcome: Progressing Goal: Diagnostic test results will improve 03/17/2021 0405 by Michail Sermon, RN Outcome: Progressing 03/17/2021 0404 by Michail Sermon, RN Outcome: Progressing Goal: Respiratory complications will improve 03/17/2021 0405 by Michail Sermon, RN Outcome: Progressing 03/17/2021 0404 by Michail Sermon, RN Outcome: Progressing Goal: Cardiovascular complication will be avoided 03/17/2021 0405 by  Michail Sermon, RN Outcome: Progressing 03/17/2021 0404 by Michail Sermon, RN Outcome: Progressing   Problem: Activity: Goal: Risk for activity intolerance will decrease 03/17/2021 0405 by Michail Sermon, RN Outcome: Progressing 03/17/2021 0404 by Michail Sermon, RN Outcome: Progressing   Problem: Nutrition: Goal: Adequate nutrition will be maintained 03/17/2021 0405 by Michail Sermon, RN Outcome: Progressing 03/17/2021 0404 by Michail Sermon, RN Outcome: Progressing   Problem: Coping: Goal: Level of anxiety will decrease 03/17/2021 0405 by Michail Sermon, RN Outcome: Progressing 03/17/2021 0404 by Michail Sermon, RN Outcome: Progressing   Problem: Elimination: Goal: Will not experience complications related to bowel motility 03/17/2021 0405 by Michail Sermon, RN Outcome: Progressing 03/17/2021 0404 by Michail Sermon, RN Outcome: Progressing Goal: Will not experience complications related to urinary retention 03/17/2021 0405 by Michail Sermon, RN Outcome: Progressing 03/17/2021 0404 by Michail Sermon, RN Outcome: Progressing   Problem: Pain Managment: Goal: General experience of comfort will improve 03/17/2021 0405 by Michail Sermon, RN Outcome: Progressing 03/17/2021 0404 by Michail Sermon, RN Outcome: Progressing   Problem: Safety: Goal: Ability to remain free from injury will improve 03/17/2021 0405 by Michail Sermon, RN Outcome: Progressing 03/17/2021 0404 by Michail Sermon, RN Outcome: Progressing   Problem: Skin Integrity: Goal: Risk for impaired skin integrity will decrease 03/17/2021 0405 by Michail Sermon, RN Outcome: Progressing 03/17/2021 0404 by Michail Sermon, RN Outcome: Progressing

## 2021-03-17 NOTE — Discharge Summary (Addendum)
Discharge Summary  Lee Johnson ZOX:[id number] DOB: December 12, 1941  PCP: Dione Housekeeper, MD  Admit date: 03/15/2021 Discharge date: 03/17/2021  Time spent: 35 minutes.  Recommendations for Outpatient Follow-up:  1. Follow-up with your rheumatologist Dr. Gavin Potters within a week. 2. Follow-up with GI in 1 to 2 weeks. 3. Follow-up with your primary care provider in 1 to 2 weeks. 4. Take your medications as prescribed. 5. Continue fall precautions.  Discharge Diagnoses:  Active Hospital Problems   Diagnosis Date Noted  . Transaminitis 03/15/2021    Resolved Hospital Problems  No resolved problems to display.    Discharge Condition: Stable.  Diet recommendation: Resume previous diet.  Vitals:   03/17/21 0352 03/17/21 0738  BP: (!) 141/106 (!) 132/91  Pulse: 93 90  Resp: (!) 22 17  Temp: 98.4 F (36.9 C) 98 F (36.7 C)  SpO2: 100% 100%    History of present illness:  Lee Johnson a 79 y.o.malewith medical history significant forpolymyositis, pulmonary fibrosis, chronic hypoxemia on 4L [place], essential hypertension, DM2,polyneuropathy, hyperlipidemia who presented to [facility] ED as a referral from his primary care provider due to worsening generalized weakness of 1month duration acutely worsened in the last few days. Patient is a poor historian. His wife at bedside reports that 2 days ago she noticed jaundice, ictericsclera. They went to his PCP yesterday who recommended to go to the ED for further evaluation. Metformin was discontinued and his prednisone dose was reduced to 7.5 mgdaily. No other changes in his medications. Per his wife he had a similar appearance about 4 days ago and it was thought that it was secondary to OFEVwhich was discontinued at that time. Patient stopped taking CellCept in August 2021 due to hypomagnesemia and concernfor arrhythmia. Patient denies having any significant abdominal pain. Denies having any diarrhea. Denies dysuria or  fevers. Per his wife at bedside his mentation is at baseline. He presented to the ED for further evaluation. In the ED work-up revealed acute transaminitis and hyperbilirubinemia. Right upper quadrant abdominal ultrasound done on 03/15/2021 revealed suspected gallstone and possible cholecystitis.    GI and general surgery were consulted.  HIDA scan done on 03/16/2021 revealed no evidence of acute cholecystitis or complete common bile duct obstruction.  MRCP done on 03/16/2021 showed no common bile duct stones or intrahepatic biliary dilatation.  No indication for ERCP or surgical intervention.  GI and general surgery signed off.  03/17/21:  Patient was seen and examined with his wife present at bedside.  He has no new complaints.  Patient and his wife want to go home today.  Called rheumatologist office and spoke with Dr. Allena Katz, Dr. Guillermina [place] colleague.  Dr. Allena Katz will have nursing staff arrange for follow-up appointment in a week with Dr. Gavin Potters.  He requested to obtain CPK and aldolase levels prior to discharge which were ordered.  Updated the patient's wife who agreed with the plan.    Hospital Course:  Active Problems:   Transaminitis  Nonobstructive jaundice suspect secondary to Imuran, HIDA scan and MRCP ruled out gallstones, cholelithiasis, and cholecystitis.   Presented with jaundice, elevated liver chemistries, T bilirubin 5.2, direct bilirubin 3.0, indirect bilirubin 2.2. His home Imuran was held on admission due to hepatotoxicity as a possible side effect. GI and general surgery were consulted. HIDA scan done on 03/16/2021 revealed no evidence of acute cholecystitis or complete common bile duct obstruction.  MRCP done on 03/16/2021 showed no common bile duct stones or intrahepatic biliary dilatation.  No indication for  ERCP or surgical intervention.  GI and general surgery signed off. Follow-up with your rheumatologist within a week.  Lactic acidosis likely secondary to  UTI. Presented with lactic acid 5.0, down trended to 3.4 on 03/16/2021. Received IV fluid hydration and IV antibiotics Rocephin and IV Flagyl, stopped on 03/17/2021, obstruction was ruled out.  Generalized weakness likely multifactorial secondary to polymyositis and Staph epidermidis UTI PT recommended SNF however wife prefers to take him home today. Complete course of antibiotics Closely follow-up with rheumatology Continue fall precautions  Staph epidermidis UTI Received 3 days of Rocephin. Urine culture came back positive for staph epidermidis, pansensitive. Start Augmentin 1 tablet twice daily x7 days. Follow-up with your PCP in 1 to 2 weeks.  Resolved post repletion: Hypovolemic hyponatremia Likely contributed by HCTZ Presented with serum sodium 133 Received IV fluid hydration normal saline. Repeated serum sodium 137 on 03/17/2021. Continue to hold off HCTZ Follow-up with your PCP in 1 to 2 weeks.  Essential hypertension BP is currently at goal 132/91. Continue to hold off home HCTZ due to recent hyponatremia. Continue home enalapril 5 mg daily. Follow-up with your PCP in 1 to 2 weeks.  Type 2 diabetes with hyperglycemia Hyperglycemia likely contributed by steroids. Hemoglobin A1c 5.7 on 03/16/2021. Resume home oral hypoglycemics  Resolved post repletion: Hypomagnesemia Serum magnesium 1.6, repeat 2.0.  Elevated lipase, downtrending  No evidence of cholelithiasis.  Possible acute gastritis on MRCP Asymptomatic Follow-up with GI  Acute on chronic anemia of chronic disease Baseline hemoglobin appears to be 11 Presented with hemoglobin of 8.9 and MCV 114 Hemoglobin down trended initially 7.9, now 8.0. Iron studies not suggestive of iron deficiency.  History of polymyositis/history of pulmonary fibrosis Hold offhome Imurandue to hepatotoxicity as aside effect. Resume home prednisone regimen. Closely follow-up with your rheumatologist within a week. Home  Ofev was discontinued 4 years ago due to jaundice Home CellCept was discontinued in August 2021 due to hypomagnesemia per his wife at bedside.    Code Status:DNRas stated by thepatient himself. His wife at bedside.   Consults called:GI.  General surgery.  Discharge Exam: BP (!) 132/91 (BP Location: Right Arm)   Pulse 90   Temp 98 F (36.7 C) (Oral)   Resp 17   Ht 5\' 10"  (1.778 m)   Wt 73.3 kg   SpO2 100%   BMI 23.19 kg/m  . General: 80 y.o. year-old male frail-appearing in no acute distress.  Alert and interactive. . Cardiovascular: Regular rate and rhythm with no rubs or gallops.  No thyromegaly or JVD noted.   Marland Kitchen. Respiratory: Clear to auscultation with no wheezes or rales. Good inspiratory effort. . Abdomen: Soft nontender nondistended with normal bowel sounds x4 quadrants. . Musculoskeletal: No lower extremity edema.  . Skin: No ulcerative lesions noted or rashes, jaundice. Marland Kitchen. Psychiatry: Mood is appropriate for condition and setting  Discharge Instructions You were cared for by a hospitalist during your hospital stay. If you have any questions about your discharge medications or the care you received while you were in the hospital after you are discharged, you can call the unit and asked to speak with the hospitalist on call if the hospitalist that took care of you is not available. Once you are discharged, your primary care physician will handle any further medical issues. Please note that NO REFILLS for any discharge medications will be authorized once you are discharged, as it is imperative that you return to your primary care physician (or establish a relationship with a primary  care physician if you do not have one) for your aftercare needs so that they can reassess your need for medications and monitor your lab values.   Allergies as of 03/17/2021      Reactions   Atorvastatin    Other reaction(s): Muscle Pain Other reaction(s): Muscle Pain Other reaction(s):  Muscle Pain Other reaction(s): Muscle Pain   Statins Other (See Comments)   Polymyositis. 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     Montelukast    Other reaction(s): Other (See Comments)   Montelukast Sodium    Other reaction(s): Unknown   Other Diarrhea   Sulfa Antibiotics Diarrhea   Sulfamethoxazole-trimethoprim Diarrhea      Medication List    STOP taking these medications   Acetaminophen 500 MG capsule   azaTHIOprine 50 MG tablet Commonly known as: IMURAN   enalapril-hydrochlorothiazide 10-25 MG tablet Commonly known as: VASERETIC     TAKE these medications   alendronate 70 MG tablet Commonly known as: FOSAMAX Take 70 mg by mouth once a week.   amoxicillin-clavulanate 875-125 MG tablet Commonly known as: AUGMENTIN Take 1 tablet by mouth every 12 (twelve) hours for 7 days.   aspirin EC 81 MG tablet Take 81 mg by mouth daily.   Calcium Carbonate-Vitamin D 600-400 MG-UNIT tablet Take 1 tablet by mouth daily.   Cholecalciferol 25 MCG (1000 UT) tablet Take 1,000 Units by mouth daily.   enalapril 5 MG tablet Commonly known as: VASOTEC Take 1 tablet (5 mg total) by mouth daily. Start taking on: March 18, 2021   esomeprazole 20 MG capsule Commonly known as: NEXIUM Take 20 mg by mouth daily.   feeding supplement Liqd Take 237 mLs by mouth 2 (two) times daily between meals for 7 days.   Fish Oil 1000 MG Caps Take 1 capsule by mouth 2 (two) times daily.   Flaxseed Oil 1000 MG Caps Take 1 capsule by mouth 2 (two) times daily.   glipiZIDE 10 MG 24 hr tablet Commonly known as: GLUCOTROL XL Take 10 mg by mouth daily as needed. If sugar is higher > 150   magnesium oxide 400 MG tablet Commonly known as: MAG-OX Take 0-800 mg by mouth See admin instructions. 450 mg in the morning and 350 in the evening   predniSONE 2.5 MG tablet Commonly known as: DELTASONE Take 3 tablets (7.5 mg total) by mouth daily. Start taking on: March 18, 2021 What changed:   medication  strength  how much to take   tamsulosin 0.4 MG Caps capsule Commonly known as: FLOMAX Take 0.4 mg by mouth daily after supper.      Allergies  Allergen Reactions  . Atorvastatin     Other reaction(s): Muscle Pain Other reaction(s): Muscle Pain Other reaction(s): Muscle Pain Other reaction(s): Muscle Pain  . Statins Other (See Comments)    Polymyositis. 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    . Montelukast     Other reaction(s): Other (See Comments)  . Montelukast Sodium     Other reaction(s): Unknown  . Other Diarrhea  . Sulfa Antibiotics Diarrhea  . Sulfamethoxazole-Trimethoprim Diarrhea    Follow-up Information    Dione Housekeeper, MD. Call in 1 day(s).   Specialty: Family Medicine Why: Please call for a post hospital follow-up appointment. Contact information: [address] [place] [address] [phone]        Kandyce Rud., MD. Call in 1 day(s).   Specialty: Rheumatology Why: Please call for a post hospital follow-up appointment. Contact information: [address] [facility] [address] [facility] [place] [address] [phone]        Stanton Kidney, MD. Call in 1 day(s).   Specialty: Gastroenterology Why: Please call for a post hospital follow-up appointment. Contact information: [address] [place] [address] [phone]                The results of significant diagnostics from this hospitalization (including imaging, microbiology, ancillary and laboratory) are listed below for reference.    Significant Diagnostic Studies: DG Chest 2 View  Result Date: 03/15/2021 CLINICAL DATA:  Cough and weakness EXAM: CHEST - 2 VIEW COMPARISON:  05/18/2016 FINDINGS: Cardiac shadow is enlarged but stable. Aortic calcifications are noted. Lungs are well aerated bilaterally with bibasilar scarring and diffuse fibrotic changes. No acute focal infiltrate or effusion is noted. No acute bony abnormality is noted. IMPRESSION:  Progressive fibrotic changes and scarring in the bases when compared with the prior exam. Electronically Signed   By: Alcide Clever M.D.   On: 03/15/2021 11:18   CT Head Wo Contrast  Result Date: 03/15/2021 CLINICAL DATA:  Transient ischemic attack (TIA) Right arm weakness EXAM: CT HEAD WITHOUT CONTRAST TECHNIQUE: Contiguous axial images were obtained from the base of the skull through the vertex without intravenous contrast. COMPARISON:  None. FINDINGS: Brain: No evidence of acute large vascular territory infarction, hemorrhage, hydrocephalus, extra-axial collection or mass lesion/mass effect. Age related global parenchymal volume loss with ex vacuo dilatation of ventricular system. There are scattered and more confluent hypodense subcortical and periventricular white matter hypodensities which are nonspecific but likely represent a mild burden of chronic ischemic white matter disease. Mineralization left basal ganglia. Vascular: No hyperdense vessel. Atherosclerotic calcifications of the intracranial portions of the internal and vertebral arteries. Skull: Unremarkable.  Negative for fracture or focal lesion. Sinuses/Orbits: Paranasal sinuses and mastoid air cells are predominantly clear. Other: Cerumen in bilateral internal auditory canals. IMPRESSION: 1. No acute intracranial findings. 2. Age related global parenchymal volume loss with mild burden of chronic ischemic white matter disease. Electronically Signed   By: Maudry Mayhew MD   On: 03/15/2021 13:04   NM Hepatobiliary Liver Func  Result Date: 03/16/2021 CLINICAL DATA:  Cholelithiasis EXAM: NUCLEAR MEDICINE HEPATOBILIARY IMAGING TECHNIQUE: Sequential images of the abdomen were obtained out to 60 minutes following intravenous administration of radiopharmaceutical. RADIOPHARMACEUTICALS:  7.5 mCi Tc-86m  Choletec IV COMPARISON:  Abdominal ultrasound March 15, 2021 and MRI abdomen March 16, 2021 FINDINGS: Delayed uptake and biliary excretion of activity  by the liver is seen. Gallbladder activity is visualized, consistent with patency of cystic duct. No biliary activity is visualized passing into small bowel however given the uptake visualized in the gallbladder, the common bile duct is at least partially patent. No definite scintigraphic evidence of radiotracer within small bowel, favored to be related to decreased counts exiting the hepatocytes as sequela of hepatic dysfunction/hepatitis. IMPRESSION: Delayed hepatic uptake and excretion of radiotracer consistent with hepatic dysfunction/hepatitis. No scintigraphic evidence of acute cholecystitis or complete common duct obstruction. Electronically Signed   By: Maudry Mayhew MD   On: 03/16/2021 16:36   MR ABDOMEN MRCP W WO CONTAST  Result Date: 03/16/2021 CLINICAL DATA:  Elevated liver function studies. Abnormal ultrasound. EXAM: MRI ABDOMEN WITHOUT AND WITH CONTRAST (INCLUDING MRCP) TECHNIQUE: Multiplanar multisequence MR imaging of the abdomen was performed both before and after the administration of intravenous contrast. Heavily T2-weighted images of the biliary and pancreatic ducts were obtained, and three-dimensional MRCP images were rendered by post  processing. CONTRAST:  42mL GADAVIST GADOBUTROL 1 MMOL/ML IV SOLN COMPARISON:  Ultrasound 03/15/2021 FINDINGS: Examination is quite limited due to respiratory motion. The patient would not or could not hold his breath. The postcontrast images in particular are very limited. Lower chest: Bibasilar airspace process, pneumonia versus atelectasis. Small bilateral pleural effusions. No pericardial effusion. Hepatobiliary: No hepatic lesions or intrahepatic biliary dilatation. Gallbladder is mildly contracted and thick-walled. Pericholecystic inflammatory changes are also noted. No definite gallstones. Normal caliber and course of the common bile duct. No common bile duct stones. Pancreas: Mild diffuse inflammation/edema in and around the pancreas is suspected  suggesting mild diffuse interstitial pancreatitis. Recommend clinical correlation. Normal pancreatic enhancement. No mass. Spleen:  Normal size.  Focal lesions. Adrenals/Urinary Tract:  Adrenal glands kidneys are grossly normal. Stomach/Bowel: There appears to be moderate fold thickening and inflammation involving the upper body and fundal region of the stomach. There is also inflammation around the stomach. Findings could suggest acute gastritis. The remainder of the stomach and the duodenum are unremarkable. The visualized small bowel and colon are unremarkable. Vascular/Lymphatic: The aorta and branch vessels are patent. Atherosclerotic changes are noted. The major venous structures are patent. No mesenteric or retroperitoneal mass or adenopathy. Other:  Small amount of scattered free fluid in the upper abdomen. Musculoskeletal: No significant bony findings. IMPRESSION: 1. Very limited examination due to respiratory motion. 2. Mild diffuse inflammation/edema in and around the pancreas is suspected suggesting mild diffuse interstitial pancreatitis. Recommend clinical correlation. 3. No common bile duct stones or intrahepatic biliary dilatation. 4. Mild gallbladder wall thickening and surrounding inflammatory changes. No definite gallstones. 5. The upper body and fundal region the stomach appear inflamed suggesting gastritis. 6. Bibasilar airspace process, pneumonia versus atelectasis. Small bilateral pleural effusions. Electronically Signed   By: Rudie Meyer M.D.   On: 03/16/2021 15:36   US ABDOMEN LIMITED RUQ (LIVER/GB)  Result Date: 03/15/2021 CLINICAL DATA:  Elevated LFTs. EXAM: ULTRASOUND ABDOMEN LIMITED RIGHT UPPER QUADRANT COMPARISON:  MRCP 05/19/2016. FINDINGS: Gallbladder: Question 3 mm gallstone. Prominent thickening of the gallbladder wall at 5.5 mm. Negative Murphy sign. Common bile duct: Diameter: 2.5 mm Liver: No focal lesion identified. Within normal limits in parenchymal echogenicity. Portal  vein is patent on color Doppler imaging with normal direction of blood flow towards the liver. Other: None. IMPRESSION: Question 3 mm gallstone. Prominent thickening of the gallbladder wall at 5.5 mm. Gallbladder wall thickening can be secondary to cholecystitis and or hypoproteinemic states. Negative Murphy sign. No biliary distention. Electronically Signed   By: Maisie Fus  Register   On: 03/15/2021 16:15    Microbiology: Recent Results (from the past 240 hour(s))  Blood culture (routine x 2)     Status: None (Preliminary result)   Collection Time: 03/15/21 10:10 AM   Specimen: BLOOD  Result Value Ref Range Status   Specimen Description BLOOD RIGHT [facility]  Final   Special Requests   Final    BOTTLES DRAWN AEROBIC AND ANAEROBIC Blood Culture adequate volume   Culture   Final    NO GROWTH 2 DAYS Performed at [facility], [address] [place], [place] [address]    Report Status PENDING  Incomplete  Blood culture (routine x 2)     Status: None (Preliminary result)   Collection Time: 03/15/21 11:45 AM   Specimen: BLOOD  Result Value Ref Range Status   Specimen Description BLOOD Blood Culture adequate volume  Final   Special Requests BOTTLES DRAWN AEROBIC AND ANAEROBIC FOREARM  Final  Culture   Final    NO GROWTH 2 DAYS Performed at [facility], [address] [place], [place] [address]    Report Status PENDING  Incomplete  Urine Culture     Status: Abnormal   Collection Time: 03/15/21 11:45 AM   Specimen: Urine, Random  Result Value Ref Range Status   Specimen Description   Final    URINE, RANDOM Performed at [facility], [address] [place], [place] [address]    Special Requests   Final    NONE Performed at [facility], [address] [place], [place] [address]    Culture >=100,000 COLONIES/mL STAPHYLOCOCCUS EPIDERMIDIS (A)  Final   Report Status 03/17/2021 FINAL  Final   Organism ID, Bacteria STAPHYLOCOCCUS EPIDERMIDIS (A)  Final       Susceptibility   Staphylococcus epidermidis - MIC*    CIPROFLOXACIN <=0.5 SENSITIVE Sensitive     GENTAMICIN <=0.5 SENSITIVE Sensitive     NITROFURANTOIN <=16 SENSITIVE Sensitive     OXACILLIN <=0.25 SENSITIVE Sensitive     TETRACYCLINE <=1 SENSITIVE Sensitive     VANCOMYCIN 1 SENSITIVE Sensitive     TRIMETH/SULFA <=10 SENSITIVE Sensitive     CLINDAMYCIN <=0.25 SENSITIVE Sensitive     RIFAMPIN <=0.5 SENSITIVE Sensitive     Inducible Clindamycin NEGATIVE Sensitive     * >=100,000 COLONIES/mL STAPHYLOCOCCUS EPIDERMIDIS  Resp Panel by RT-PCR (Flu A&B, Covid) Nasopharyngeal Swab     Status: None   Collection Time: 03/15/21 11:50 AM   Specimen: Nasopharyngeal Swab; Nasopharyngeal(NP) swabs in vial transport medium  Result Value Ref Range Status   SARS Coronavirus 2 by RT PCR NEGATIVE NEGATIVE Final    Comment: (NOTE) SARS-CoV-2 target nucleic acids are NOT DETECTED.  The SARS-CoV-2 RNA is generally detectable in upper respiratory specimens during the acute phase of infection. The lowest concentration of SARS-CoV-2 viral copies this assay can detect is 138 copies/mL. A negative result does not preclude SARS-Cov-2 infection and should not be used as the sole basis for treatment or other patient management decisions. A negative result may occur with  improper specimen collection/handling, submission of specimen other than nasopharyngeal swab, presence of viral mutation(s) within the areas targeted by this assay, and inadequate number of viral copies(<138 copies/mL). A negative result must be combined with clinical observations, patient history, and epidemiological information. The expected result is Negative.  Fact Sheet for Patients:  BloggerCourse.com  Fact Sheet for Healthcare Providers:  SeriousBroker.it  This test is no t yet approved or cleared by the [place] FDA and  has been authorized for detection and/or diagnosis  of SARS-CoV-2 by FDA under an Emergency Use Authorization (EUA). This EUA will remain  in effect (meaning this test can be used) for the duration of the COVID-19 declaration under Section 564(b)(1) of the Act, 21 U.S.C.section 360bbb-3(b)(1), unless the authorization is terminated  or revoked sooner.       Influenza A by PCR NEGATIVE NEGATIVE Final   Influenza B by PCR NEGATIVE NEGATIVE Final    Comment: (NOTE) The Xpert Xpress SARS-CoV-2/FLU/RSV plus assay is intended as an aid in the diagnosis of influenza from Nasopharyngeal swab specimens and should not be used as a sole basis for treatment. Nasal washings and aspirates are unacceptable for Xpert Xpress SARS-CoV-2/FLU/RSV testing.  Fact Sheet for Patients: BloggerCourse.com  Fact Sheet for Healthcare Providers: SeriousBroker.it  This test is not yet approved or cleared by the [place] FDA and has been authorized for detection and/or diagnosis  of SARS-CoV-2 by FDA under an Emergency Use Authorization (EUA). This EUA will remain in effect (meaning this test can be used) for the duration of the COVID-19 declaration under Section 564(b)(1) of the Act, 21 U.S.C. section 360bbb-3(b)(1), unless the authorization is terminated or revoked.  Performed at [facility], [address] [place], [place] [address]      Labs: Basic Metabolic Panel: Recent Labs  Lab 03/15/21 1010 03/16/21 0513 03/17/21 0452  NA 133* 139 137  K 4.2 4.4 3.8  CL 92* 100 102  CO2 27 31 28   GLUCOSE 179* 104* 144*  BUN 30* 22 13  CREATININE 1.17 0.99 0.83  CALCIUM 9.5 8.5* 8.1*  MG 1.6* 2.0  --   PHOS  --  3.0  --    Liver Function Tests: Recent Labs  Lab 03/15/21 1010 03/16/21 0513 03/17/21 0452  AST 69* 54* 54*  ALT 61* 50* 51*  ALKPHOS 162* 133* 133*  BILITOT 5.2* 4.5* 4.3*  PROT 6.0* 5.1* 4.9*  ALBUMIN 3.0* 2.4* 2.5*   Recent Labs  Lab 03/15/21 1010 03/16/21 0513   LIPASE 94* 90*   No results for input(s): AMMONIA in the last 168 hours. CBC: Recent Labs  Lab 03/15/21 1010 03/16/21 0513 03/16/21 1032 03/17/21 0452  WBC 5.7 4.0  --  3.9*  NEUTROABS  --  2.7  --  2.7  HGB 8.9* 7.6* 7.9* 8.0*  HCT 27.7* 23.8* 24.5* 23.6*  MCV 114.0* 113.9*  --  110.8*  PLT 269 185  --  183   Cardiac Enzymes: Recent Labs  Lab 03/15/21 1010 03/17/21 0452  CKTOTAL 30* 43*   BNP: BNP (last 3 results) No results for input(s): BNP in the last 8760 hours.  ProBNP (last 3 results) No results for input(s): PROBNP in the last 8760 hours.  CBG: Recent Labs  Lab 03/16/21 0811 03/16/21 1344 03/16/21 1650 03/16/21 2037 03/17/21 0738  GLUCAP 120* 224* 145* 205* 164*       Signed:  03/19/21, MD [facility] 03/17/2021, 11:52 AM

## 2021-03-17 NOTE — Discharge Instructions (Addendum)
Polymyositis Polymyositis is a disease that causes inflammation and weakness of the muscles. It can also affect skin tissues. Polymyositis is also known as idiopathic inflammatory myopathy. It is often associated with diseases in which the body mistakenly attacks its own cells and tissues (autoimmune diseases). What are the causes? The cause of polymyositis is not known. What increases the risk? The following factors may make you more likely to develop this condition:  Gender. Polymyositis is more common in women.  Age. The disease is more common in adults. What are the signs or symptoms? Symptoms of this condition include:  Weakness in your neck, shoulder, upper arm, hip, or thigh.  Having difficulty: ? Rising from a seated position. ? Climbing stairs. ? Lifting objects. ? Reaching overhead. ? Swallowing (dysphagia).  Sore muscles.  Fatigue.  Fever.  Hard bumps under your skin.  Unexplained weight loss. How is this diagnosed? This condition may be diagnosed based on medical history and a physical exam. Various tests may also be done, including:  Blood tests.  MRI.  EMG (electromyography). This is a test to check the electrical activity of your muscles.  Biopsy. This is a test in which a sample of muscle tissue is taken and checked under a microscope. How is this treated? There is no cure for polymyositis, but treatment can improve muscle strength and function. The earlier the treatment is started, the more effective it is. Treatment may include:  Corticosteroid medicines to help control inflammation.  Immunoglobulin medicines to introduce healthy antibodies from blood donors.  Immunosuppressive medicines to control the activity of the immune system.  Physical therapy to strengthen muscles.  Speech and language therapy to improve your ability to talk and eat. Follow these instructions at home: Safety  Stay active. An exercise routine can help you build and  maintain muscle strength. Talk with your health care provider or your physical therapist before you start any exercise program.  If necessary, take steps to prevent falls by: ? Installing grab bars for your tub, shower, and toilet. ? Installing a pole that goes from floor to ceiling. This helps you when going from a seated to a standing position.  Protect your skin from the sun. Wear sunscreen or protective clothing when you are outside.   General instructions  Work closely with your health care team, including physical and speech therapists, if needed.  Take over-the-counter and prescription medicines only as told by your health care provider.  Avoid alcohol.  Rest when you are tired.  Do not use any products that contain nicotine or tobacco, such as cigarettes and e-cigarettes. If you need help quitting, ask your health care provider.  Keep all follow-up visits as told by your health care provider. This is important.   Contact a health care provider if you:  Develop new or worsening muscle weakness. Get help right away if you have:  Trouble swallowing or speaking.  Shortness of breath. Summary  Polymyositis is a disease that causes inflammation and weakness of your muscles. It can also affect your skin tissues.  It is often associated with diseases in which the body attacks its own cells and tissues (autoimmune diseases).  The cause of polymyositis is not known.  There is no cure for polymyositis, but treatment can improve muscle strength and function. The earlier treatment is started, the more effective it is. This information is not intended to replace advice given to you by your health care provider. Make sure you discuss any questions you have with  your health care provider. Document Revised: 01/30/2020 Document Reviewed: 01/15/2018 Elsevier Patient Education  2021 [place]. Fatigue If you have fatigue, you feel tired all the time and have a lack of energy or a  lack of motivation. Fatigue may make it difficult to start or complete tasks because of exhaustion. In general, occasional or mild fatigue is often a normal response to activity or life. However, long-lasting (chronic) or extreme fatigue may be a symptom of a medical condition. Follow these instructions at home: General instructions  Watch your fatigue for any changes.  Go to bed and get up at the same time every day.  Avoid fatigue by pacing yourself during the day and getting enough sleep at night.  Maintain a healthy weight. Medicines  Take over-the-counter and prescription medicines only as told by your health care provider.  Take a multivitamin, if told by your health care provider.  Do not use herbal or dietary supplements unless they are approved by your health care provider. Activity  Exercise regularly, as told by your health care provider.  Use or practice techniques to help you relax, such as yoga, tai chi, meditation, or massage therapy.   Eating and drinking  Avoid heavy meals in the evening.  Eat a well-balanced diet, which includes lean proteins, whole grains, plenty of fruits and vegetables, and low-fat dairy products.  Avoid consuming too much caffeine.  Avoid the use of alcohol.  Drink enough fluid to keep your urine pale yellow.   Lifestyle  Change situations that cause you stress. Try to keep your work and personal schedule in balance.  Do not use any products that contain nicotine or tobacco, such as cigarettes and e-cigarettes. If you need help quitting, ask your health care provider.  Do not use drugs. Contact a health care provider if:  Your fatigue does not get better.  You have a fever.  You suddenly lose or gain weight.  You have headaches.  You have trouble falling asleep or sleeping through the night.  You feel angry, guilty, anxious, or sad.  You are unable to have a bowel movement (constipation).  Your skin is dry.  You have  swelling in your legs or another part of your body. Get help right away if:  You feel confused.  Your vision is blurry.  You feel faint or you pass out.  You have a severe headache.  You have severe pain in your abdomen, your back, or the area between your waist and hips (pelvis).  You have chest pain, shortness of breath, or an irregular or fast heartbeat.  You are unable to urinate, or you urinate less than normal.  You have abnormal bleeding, such as bleeding from the rectum, vagina, nose, lungs, or nipples.  You vomit blood.  You have thoughts about hurting yourself or others. If you ever feel like you may hurt yourself or others, or have thoughts about taking your own life, get help right away. You can go to your nearest emergency department or call:  Your local emergency services (911 in the [place]  A suicide crisis helpline, such as the [place] at 857-654-5124. This is open 24 hours a day. Summary  If you have fatigue, you feel tired all the time and have a lack of energy or a lack of motivation.  Fatigue may make it difficult to start or complete tasks because of exhaustion.  Long-lasting (chronic) or extreme fatigue may be a symptom of a medical condition.  Exercise regularly, as told by your health care provider.  Change situations that cause you stress. Try to keep your work and personal schedule in balance. This information is not intended to replace advice given to you by your health care provider. Make sure you discuss any questions you have with your health care provider. Document Revised: 07/02/2019 Document Reviewed: 09/05/2017 Elsevier Patient Education  2021 [place] refers to the changes in the body that occur during a period of inactivity. The changes happen in the heart, lungs, and muscles. They make you feel tired and weak (fatigued) and decrease your ability to be active. The three  stages of deconditioning include:  Mild deconditioning. This is a change in your ability to do your usual exercise activities, such as running, biking, or swimming.  Moderate deconditioning. This is a change in your ability to do normal everyday activities, such as walking, shopping for groceries, and doing chores.  Severe deconditioning. In this stage, you may not be able to do minimal activity or usual self-care. What are the causes? Deconditioning can occur after only a few days of inactivity. The longer the period of inactivity, the more severe the deconditioning will be, and the longer it will take to return to your previous level of functioning. Deconditioning is often caused by inactivity due to:  Illnesses, such as cancer, stroke, heart attack, fibromyalgia, and chronic fatigue syndrome.  Injuries, especially back injuries, broken bones, and injuries to soft tissues, such as ligaments and tendons.  A long stay in the hospital.  Pregnancy, especially if long periods of bed rest are needed. What increases the risk? The following factors may make you more likely to develop this condition:  Staying in the hospital or being on bed rest.  Obesity.  Poor nutrition.  Being an older adult.  Having an injury or illness that affects your movement and activity. What are the signs or symptoms? Symptoms of this condition include:  Weakness and tiredness.  Shortness of breath with minor physical effort (exertion).  A heartbeat that is faster than normal. You may not notice this without taking your pulse.  Pain or discomfort with activity.  Decreased strength, endurance, and balance.  Difficulty doing your usual forms of exercise.  Difficulty doing activities of daily living, such as grocery shopping or chores. You may also have problems walking around the house and doing basic self-care, such as getting to the bathroom, preparing meals, or doing laundry. How is this  diagnosed? This condition is diagnosed based on your medical history and a physical exam. During the physical exam, your health care provider will check for signs of deconditioning, such as:  Decreased size of muscles.  Decreased strength.  Trouble with balance.  Shortness of breath or a heart rate that is faster than normal after minor exertion. How is this treated? Treatment for this condition involves an exercise program in which activity is increased slowly. Your health care provider will tell you which exercises are right for you. The exercise program will likely include:  Aerobic exercise. This type of exercise helps improve the functioning of the heart, lungs, and muscles.  Strength training. This type of exercise helps increase muscle size and strength. Both of these types of exercise will improve your endurance. You may be referred to a physical therapist who can create a safe strengthening program for you to follow. Follow these instructions at home: Eating and drinking  Eat a healthy, well-balanced diet. This includes: ? Proteins,  such as lean meats and fish, to build muscles. ? Fresh fruits and vegetables. ? Carbohydrates, such as whole grains, to boost energy.  Drink enough fluid to keep your urine pale yellow.   Activity  Follow the exercise program that is recommended by your health care provider or physical therapist.  Do not increase your exercise any faster than directed.   General instructions  Take over-the-counter and prescription medicines only as told by your health care provider.  Do not use any products that contain nicotine or tobacco, such as cigarettes, e-cigarettes, and chewing tobacco. If you need help quitting, ask your health care provider.  Keep all follow-up visits as told by your health care provider. This is important. Contact a health care provider if:  You are not able to do the recommended exercise program.  You are becoming more and  more tired and weak.  You become light-headed when rising to a sitting or standing position.  Your level of endurance decreases after it has improved. Get help right away if you:  Have chest pain.  Are very short of breath.  Have any episodes of fainting. Summary  Deconditioning refers to the changes in the body that occur during a period of inactivity.  Deconditioning happens in the heart, lungs, and muscles. The changes make you feel tired and weak and decrease your ability to be active.  Treatment for deconditioning involves an exercise program in which activity is increased slowly. This information is not intended to replace advice given to you by your health care provider. Make sure you discuss any questions you have with your health care provider. Document Revised: 05/08/2019 Document Reviewed: 05/08/2019 Elsevier Patient Education  2021 [place].   Weakness Weakness is a lack of strength. You may feel weak all over your body (generalized), or you may feel weak in one part of your body (focal). There are many potential causes of weakness. Sometimes, the cause of your weakness may not be known. Some causes of weakness can be serious, so it is important to see your doctor. Follow these instructions at home: Activity  Rest as needed.  Try to get enough sleep. Most adults need 7-8 hours of sleep each night. Talk to your doctor about how much sleep you need each night.  Do exercises, such as arm curls and leg raises, for 30 minutes at least 2 days a week or as told by your doctor.  Think about working with a physical therapist or trainer to help you get stronger. General instructions  Take over-the-counter and prescription medicines only as told by your doctor.  Eat a healthy, well-balanced diet. This includes: ? Proteins to build muscles, such as lean meats and fish. ? Fresh fruits and vegetables. ? Carbohydrates to boost energy, such as whole grains.  Drink enough  fluid to keep your pee (urine) pale yellow.  Keep all follow-up visits as told by your doctor. This is important.   Contact a doctor if:  Your weakness does not get better or it gets worse.  Your weakness affects your ability to: ? Think clearly. ? Do your normal daily activities. Get help right away if you:  Have sudden weakness on one side of your face or body.  Have chest pain.  Have trouble breathing or shortness of breath.  Have problems with your vision.  Have trouble talking or swallowing.  Have trouble standing or walking.  Are light-headed.  Pass out (lose consciousness). Summary  Weakness is a lack of strength.  You may feel weak all over your body or just in one part of your body.  There are many potential causes of weakness. Sometimes, the cause of your weakness may not be known.  Rest as needed, and try to get enough sleep. Most adults need 7-8 hours of sleep each night.  Eat a healthy, well-balanced diet. This information is not intended to replace advice given to you by your health care provider. Make sure you discuss any questions you have with your health care provider. Document Revised: 07/17/2018 Document Reviewed: 07/17/2018 Elsevier Patient Education  [place].

## 2021-03-17 NOTE — Evaluation (Signed)
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 He presented to the [facility] ED yesterday for chief complaint of worsening generalized weakness and painless jaundice. Admitted for evaluation of transaminitis and cholestasis in setting of suspected acute cholecystitis   Clinical Impression   Pt seen for OT evaluation this date. Upon arrival to room, pt awake and seated in bed with wife and daughter at bedside. Pt and wife initially deferring OT session, stating that they were going home today and didn't want therapy. OT provided education on the role of OT and OT's role in providing d/c recommendation to maximize safety following d/c, with family agreeable to answering evaluation questions. Prior to admission, pt was living with wife in a 1-story "slab house", on 4L of O2. Pt required assist/supervision from wife for transfers to/from [facility], seated bathing, and seated dressing. Following PLOF questions, OT encouraged pt to participate in seated ADLs, however family vehemently deferring functional mobility out of concern that it would significantly elevate his HR and fatigue him, subsequently limiting his ability to participate in functional mobility/transfers for discharge today. OT encouraged family to consider level of assist pt would currently need if discharged home, with wife stating that she is able to provide all the assistance pt needs at this moment. Pt currently presents with decreased activity tolerance and would benefit from additional skilled OT services to maximize return to PLOF and minimize risk of future falls, injury, caregiver burden, and readmission. Upon discharge, recommend SNF (however family appears to be not interested, in which case pt would  benefit from [facility] services).      Follow Up Recommendations  SNF    Equipment Recommendations  None recommended by OT       Precautions / Restrictions Precautions Precautions: Fall Restrictions Weight Bearing Restrictions: No      Mobility Bed Mobility               General bed mobility comments: Pt and family refused to have pt participate in functional mobility                       ADL either performed or assessed with clinical judgement   ADL  Deferred by pt and family                                                          Pertinent Vitals/Pain Pain Assessment: No/denies pain        Extremity/Trunk Assessment Upper Extremity Assessment Upper Extremity Assessment: Generalized weakness   Lower Extremity Assessment Lower Extremity Assessment: Generalized weakness       Communication Communication Communication: No difficulties   Cognition Arousal/Alertness: Awake/alert Behavior During Therapy: WFL for tasks assessed/performed Overall Cognitive Status: Within Functional Limits for tasks assessed                                                Home Living Family/patient expects to be discharged to:: Private residence Living Arrangements:  Spouse/significant other Available Help at Discharge: Family;Available 24 hours/day Type of Home: House Home Access: Level entry Entrance Stairs-Number of Steps: <4" threshold Entrance Stairs-Rails: None Home Layout: Multi-level;Able to live on main level with bedroom/bathroom     Bathroom Shower/Tub: Walk-in shower         Home Equipment: Environmental consultant - 4 wheels;Shower seat;Wheelchair - [employer];Other (comment);Hand held shower head (O2)          Prior Functioning/Environment Level of Independence: Needs assistance  Gait / Transfers Assistance Needed: Short household distances with rollator or manual w/c (pt's wife usually around whenever pt  mobilizes for safety).  Apparently hasn't walked more than a few feet/transfers for months now.  Rarely out of the home ADL's / Homemaking Assistance Needed: Dolores Lory assist from family for bathing. Able to participate in some UB seated dressing            OT Problem List: Decreased strength;Decreased activity tolerance;Impaired balance (sitting and/or standing);Decreased safety awareness      OT Treatment/Interventions: Self-care/ADL training;Therapeutic exercise;Energy conservation;DME and/or AE instruction;Therapeutic activities;Patient/family education;Balance training    OT Goals(Current goals can be found in the care plan section) Acute Rehab OT Goals Patient Stated Goal: to go home today OT Goal Formulation: With patient/family Time For Goal Achievement: 03/31/21 Potential to Achieve Goals: Fair ADL Goals Pt Will Perform Grooming: with supervision;with set-up;sitting Pt Will Perform Upper Body Dressing: with set-up;with supervision;sitting Pt Will Transfer to Toilet: with min assist;stand pivot transfer;bedside commode  OT Frequency: Min 1X/week    AM-PAC OT "6 Clicks" Daily Activity     Outcome Measure Help from another person eating meals?: None Help from another person taking care of personal grooming?: A Little Help from another person toileting, which includes using toliet, bedpan, or urinal?: A Lot Help from another person bathing (including washing, rinsing, drying)?: A Lot Help from another person to put on and taking off regular upper body clothing?: A Little Help from another person to put on and taking off regular lower body clothing?: A Lot 6 Click Score: 16   End of Session Equipment Utilized During Treatment: Oxygen Nurse Communication: Mobility status  Activity Tolerance: Treatment limited secondary to agitation Patient left: in bed;with call bell/phone within reach  OT Visit Diagnosis: Muscle weakness (generalized) (M62.81)                Time:  8144-8185 OT Time Calculation (min): 8 min Charges:  OT General Charges $OT Visit: 1 Visit OT Evaluation $OT Eval Moderate Complexity: 1 Mod   Matthew Folks, OTR/L [facility] [phone]

## 2021-03-17 NOTE — TOC Transition Note (Signed)
Transition of Care [facility]) - CM/SW Discharge Note   Patient Details  Name: Lee Johnson MRN: [id number] Date of Birth: Sep 19, 1941  [facility] CM/SW Contact:  Allayne Butcher, RN Phone Number: 03/17/2021, 2:10 PM   Clinical Narrative:    Patient admitted to hospital with transaminitis, he is medically cleared for discharge home today.  PT has recommended SNF but patient and wife both adamantly refuse.  Patient also refuses home health services.  Patient lives with wife and she and the patient's daughter are able to help patient at home.  Patient has all needed DME, walker, wheelchair, oxygen.  Family will transport patient home.  TOC signed off.     Final next level of care: Home/Self Care Barriers to Discharge: No Barriers Identified   Patient Goals and CMS Choice Patient states their goals for this hospitalization and ongoing recovery are:: Family and patient just want to go home      Discharge Placement                       Discharge Plan and Services   Discharge Planning Services: CM Consult            DME Arranged: N/A DME Agency: NA       HH Arranged: Patient Refused HH,Refused SNF          Social Determinants of Health (SDOH) Interventions     Readmission Risk Interventions No flowsheet data found.

## 2021-03-18 LAB — ALDOLASE: Aldolase: 4.7 U/L (ref 3.3–10.3)

## 2021-03-20 LAB — CULTURE, BLOOD (ROUTINE X 2)
Culture: NO GROWTH
Culture: NO GROWTH
Special Requests: ADEQUATE
Specimen Description: ADEQUATE

## 2021-04-13 ENCOUNTER — Ambulatory Visit (INDEPENDENT_AMBULATORY_CARE_PROVIDER_SITE_OTHER): Payer: Medicare Other | Admitting: Urology

## 2021-04-13 ENCOUNTER — Encounter: Payer: Self-pay | Admitting: Urology

## 2021-04-13 ENCOUNTER — Other Ambulatory Visit: Payer: Self-pay

## 2021-04-13 VITALS — BP 129/71 | HR 128 | Ht 69.0 in | Wt 164.0 lb

## 2021-04-13 DIAGNOSIS — N401 Enlarged prostate with lower urinary tract symptoms: Secondary | ICD-10-CM

## 2021-04-13 DIAGNOSIS — N390 Urinary tract infection, site not specified: Secondary | ICD-10-CM

## 2021-04-13 DIAGNOSIS — R350 Frequency of micturition: Secondary | ICD-10-CM

## 2021-04-13 DIAGNOSIS — Z87440 Personal history of urinary (tract) infections: Secondary | ICD-10-CM | POA: Diagnosis not present

## 2021-04-13 LAB — BLADDER SCAN AMB NON-IMAGING: Scan Result: 35

## 2021-04-13 NOTE — Progress Notes (Signed)
04/13/2021 2:06 PM   Lee Johnson [birth year] [id number]  Referring provider: Dione Housekeeper, MD [address] [place],  [place] [address]  Chief Complaint  Patient presents with  . Recurrent UTI    HPI: Lee Johnson is a 80 y.o. male referred for evaluation of recurrent UTI.   Admitted [facility] 03/15/2021 with transaminitis  Had nitrite positive urine and 6-10 WBC  Urine culture grew >100,000 staph epidermis-pansensitive  Treated with Rocephin and discharged on Augmentin  Denies prior urinary tract infections  He does have bothersome lower urinary tract symptoms including urinary frequency, urgency and a weak urinary stream  Has been on tamsulosin ~ 8 years  His most bothersome symptom is urinary frequency  Variable nocturia anywhere from 3 times per night to every hour  No history of snoring or sleep apnea  No previous neurologic history including CVA, Parkinson's, degenerative disc disease or MS   PMH: Past Medical History:  Diagnosis Date  . Diabetes mellitus without complication (HCC)   . Hypertension   . Polymyositis (HCC)   . Pulmonary fibrosis [facility])     Surgical History: Past Surgical History:  Procedure Laterality Date  . COLONOSCOPY WITH PROPOFOL N/A 05/25/2016   Procedure: COLONOSCOPY WITH PROPOFOL;  Surgeon: Midge Minium, MD;  Location: [facility] ENDOSCOPY;  Service: Endoscopy;  Laterality: N/A;  . ESOPHAGOGASTRODUODENOSCOPY (EGD) WITH PROPOFOL N/A 05/25/2016   Procedure: ESOPHAGOGASTRODUODENOSCOPY (EGD) WITH PROPOFOL;  Surgeon: Midge Minium, MD;  Location: [facility] ENDOSCOPY;  Service: Endoscopy;  Laterality: N/A;  . EYE SURGERY    . HERNIA REPAIR    . TESTICLE REMOVAL      Home Medications:  Allergies as of 04/13/2021      Reactions   Atorvastatin    Other reaction(s): Muscle Pain Other reaction(s): Muscle Pain Other reaction(s): Muscle Pain Other reaction(s): Muscle Pain   Statins Other (See Comments)   Polymyositis. Damage to both lower  lungs.     Montelukast    Other reaction(s): Other (See Comments)   Montelukast Sodium    Other reaction(s): Unknown   Other Diarrhea   Sulfa Antibiotics Diarrhea   Sulfamethoxazole-trimethoprim Diarrhea      Medication List       Accurate as of April 13, 2021  2:06 PM. If you have any questions, ask your nurse or doctor.        alendronate 70 MG tablet Commonly known as: FOSAMAX Take 70 mg by mouth once a week.   aspirin EC 81 MG tablet Take 81 mg by mouth daily.   Calcium Carbonate-Vitamin D 600-400 MG-UNIT tablet Take 1 tablet by mouth daily.   Cholecalciferol 25 MCG (1000 UT) tablet Take 1,000 Units by mouth daily.   enalapril 5 MG tablet Commonly known as: VASOTEC Take 1 tablet (5 mg total) by mouth daily.   esomeprazole 20 MG capsule Commonly known as: NEXIUM Take 20 mg by mouth daily.   Fish Oil 1000 MG Caps Take 1 capsule by mouth 2 (two) times daily.   Flaxseed Oil 1000 MG Caps Take 1 capsule by mouth 2 (two) times daily.   glipiZIDE 10 MG 24 hr tablet Commonly known as: GLUCOTROL XL Take 10 mg by mouth daily as needed. If sugar is higher > 150   magnesium oxide 400 MG tablet Commonly known as: MAG-OX Take 0-800 mg by mouth See admin instructions. 450 mg in the morning and 350 in the evening   predniSONE 2.5 MG tablet Commonly known as: DELTASONE Take 3 tablets (7.5 mg  total) by mouth daily.   tamsulosin 0.4 MG Caps capsule Commonly known as: FLOMAX Take 0.4 mg by mouth daily after supper.       Allergies:  Allergies  Allergen Reactions  . Atorvastatin     Other reaction(s): Muscle Pain Other reaction(s): Muscle Pain Other reaction(s): Muscle Pain Other reaction(s): Muscle Pain  . Statins Other (See Comments)    Polymyositis. 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    . Montelukast     Other reaction(s): Other (See Comments)  . Montelukast Sodium     Other reaction(s): Unknown  . Other Diarrhea  . Sulfa Antibiotics Diarrhea  .  Sulfamethoxazole-Trimethoprim Diarrhea    Family History: Family History  Problem Relation Age of Onset  . CAD Mother     Social History:  reports that he has quit smoking. He has never used smokeless tobacco. He reports that he does not drink alcohol and does not use drugs.   Physical Exam: BP 129/71   Pulse (!) 128   Ht 5\' 9"  (1.753 m)   Wt 164 lb (74.4 kg)   BMI 24.22 kg/m   Constitutional:  Alert and oriented, No acute distress. HEENT: [place] AT, moist mucus membranes.  Trachea midline, no masses. Cardiovascular: No clubbing, cyanosis, or edema. Respiratory: Normal respiratory effort, no increased work of breathing. Neurologic: Grossly intact, no focal deficits, moving all 4 extremities. Psychiatric: Normal mood and affect.  Laboratory Data:  Urinalysis Dipstick trace protein/microscopy negative   Assessment & Plan:    1.  BPH with LUTS  Bladder scan PVR 35 mL  Most bothersome urinary symptom is frequency  Trial Myrbetriq 25 mg daily x28 days-samples given  Call back 1 month regarding efficacy  Continue tamsulosin  2.  History UTI  Urinalysis today clear    , MD  [facility] [address] [place], [place] [id number]

## 2021-04-14 ENCOUNTER — Encounter: Payer: Self-pay | Admitting: Urology

## 2021-04-15 LAB — URINALYSIS, COMPLETE
Bilirubin, UA: NEGATIVE
Glucose, UA: NEGATIVE
Ketones, UA: NEGATIVE
Leukocytes,UA: NEGATIVE
Nitrite, UA: NEGATIVE
RBC, UA: NEGATIVE
Specific Gravity, UA: 1.02 (ref 1.005–1.030)
Urobilinogen, Ur: 0.2 mg/dL (ref 0.2–1.0)
pH, UA: 7.5 (ref 5.0–7.5)

## 2021-04-15 LAB — MICROSCOPIC EXAMINATION
Bacteria, UA: NONE SEEN
RBC, Urine: NONE SEEN /hpf (ref 0–2)

## 2021-05-03 ENCOUNTER — Encounter: Payer: Self-pay | Admitting: Urology

## 2021-08-18 ENCOUNTER — Other Ambulatory Visit: Payer: Self-pay | Admitting: *Deleted

## 2021-08-18 ENCOUNTER — Encounter: Payer: Self-pay | Admitting: Urology

## 2021-08-18 MED ORDER — MIRABEGRON ER 25 MG PO TB24: 25.0000 mg | ORAL_TABLET | Freq: Every day | ORAL | 0 refills | Status: DC

## 2021-08-18 NOTE — Telephone Encounter (Signed)
We can send a prescription for Myrbetriq 25 mg daily and have him follow-up in 1 month.

## 2021-09-20 ENCOUNTER — Ambulatory Visit: Payer: Medicare Other | Admitting: Urology

## 2021-09-20 NOTE — Progress Notes (Signed)
09/21/2021 10:16 AM   Estrellita Ludwig May 03, 1941 [id number]  Referring provider: [place], [place] [facility] [place],  [place] [address]  Chief Complaint  Patient presents with   Benign Prostatic Hypertrophy   Urological history: 1. UTI -contributing factors of age and diabetes -documented positive urine cultures over the last year  March 10, 2021 Staphylococcus coagulase-negative  March 15, 2021 Staph epidermidis  2. BPH with LU TS -I PSS 21/5 -PVR 0 mL -managed with tamsulosin 0.4 mg daily   HPI: Stevens Magwood is a 80 y.o. male who presents today for a follow up after a one month trial of Myrbetriq 25 mg with his wife, Shirlee Limerick.   He complains of frequency.  He feels that the Myrbetriq was not helpful.  He is drinking one or two cups of coffee a day, Power Aid daily at lunch and water.  No more fluids after 6 o'clock.  He typically goes to bed a 10:30 pm.  He wears oxygen at night.  He denies any pedal edema.    He is limited his salt intake.    He has been having issues with nocturia for one year.    Patient denies any modifying or aggravating factors.  Patient denies any gross hematuria, dysuria or suprapubic/flank pain.  Patient denies any fevers, chills, nausea or vomiting.     IPSS     Row Name 09/21/21 0900         International Prostate Symptom Score   How often have you had the sensation of not emptying your bladder? About half the time     How often have you had to urinate less than every two hours? Almost always     How often have you found you stopped and started again several times when you urinated? About half the time     How often have you found it difficult to postpone urination? Less than 1 in 5 times     How often have you had a weak urinary stream? More than half the time     How often have you had to strain to start urination? Not at All     How many times did you typically get up at night to urinate? 5 Times     Total IPSS  Score 21           Quality of Life due to urinary symptoms   If you were to spend the rest of your life with your urinary condition just the way it is now how would you feel about that? Unhappy              Score:  1-7 Mild 8-19 Moderate 20-35 Severe   PMH: Past Medical History:  Diagnosis Date   Diabetes mellitus without complication (HCC)    Hypertension    Polymyositis ([place])    Pulmonary fibrosis ([place])     Surgical History: Past Surgical History:  Procedure Laterality Date   COLONOSCOPY WITH PROPOFOL N/A 05/25/2016   Procedure: COLONOSCOPY WITH PROPOFOL;  Surgeon: Lucilla Lame, MD;  Location: [facility] ENDOSCOPY;  Service: Endoscopy;  Laterality: N/A;   ESOPHAGOGASTRODUODENOSCOPY (EGD) WITH PROPOFOL N/A 05/25/2016   Procedure: ESOPHAGOGASTRODUODENOSCOPY (EGD) WITH PROPOFOL;  Surgeon: Lucilla Lame, MD;  Location: [facility] ENDOSCOPY;  Service: Endoscopy;  Laterality: N/A;   EYE SURGERY     HERNIA REPAIR     TESTICLE REMOVAL      Home Medications:  Allergies as of 09/21/2021  Reactions   Atorvastatin    Other reaction(s): Muscle Pain Other reaction(s): Muscle Pain Other reaction(s): Muscle Pain Other reaction(s): Muscle Pain   Statins Other (See Comments)   Polymyositis. 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     Montelukast    Other reaction(s): Other (See Comments)   Montelukast Sodium    Other reaction(s): Unknown   Other Diarrhea   Sulfa Antibiotics Diarrhea   Sulfamethoxazole-trimethoprim Diarrhea        Medication List        Accurate as of September 21, 2021 10:16 AM. If you have any questions, ask your nurse or doctor.          alendronate 70 MG tablet Commonly known as: FOSAMAX Take 70 mg by mouth once a week.   aspirin EC 81 MG tablet Take 81 mg by mouth daily.   Calcium Carbonate-Vitamin D 600-400 MG-UNIT tablet Take 1 tablet by mouth daily.   Cholecalciferol 25 MCG (1000 UT) tablet Take 1,000 Units by mouth daily.   enalapril 5 MG  tablet Commonly known as: VASOTEC Take 1 tablet (5 mg total) by mouth daily.   esomeprazole 20 MG capsule Commonly known as: NEXIUM Take 20 mg by mouth daily.   Fish Oil 1000 MG Caps Take 1 capsule by mouth 2 (two) times daily.   Flaxseed Oil 1000 MG Caps Take 1 capsule by mouth 2 (two) times daily.   glipiZIDE 10 MG 24 hr tablet Commonly known as: GLUCOTROL XL Take 10 mg by mouth daily as needed. If sugar is higher > 150   magnesium oxide 400 MG tablet Commonly known as: MAG-OX Take 0-800 mg by mouth See admin instructions. 450 mg in the morning and 350 in the evening   mirabegron ER 50 MG Tb24 tablet Commonly known as: MYRBETRIQ Take 1 tablet (50 mg total) by mouth daily. What changed:  medication strength how much to take Changed by: Saphyra Hutt, PA-C   predniSONE 5 MG tablet Commonly known as: DELTASONE Take 7.5 mg by mouth daily.   tamsulosin 0.4 MG Caps capsule Commonly known as: FLOMAX Take 0.4 mg by mouth daily after supper.        Allergies:  Allergies  Allergen Reactions   Atorvastatin     Other reaction(s): Muscle Pain Other reaction(s): Muscle Pain Other reaction(s): Muscle Pain Other reaction(s): Muscle Pain   Statins Other (See Comments)    Polymyositis. 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     Montelukast     Other reaction(s): Other (See Comments)   Montelukast Sodium     Other reaction(s): Unknown   Other Diarrhea   Sulfa Antibiotics Diarrhea   Sulfamethoxazole-Trimethoprim Diarrhea    Family History: Family History  Problem Relation Age of Onset   CAD Mother     Social History:  reports that he has quit smoking. He has never used smokeless tobacco. He reports that he does not drink alcohol and does not use drugs.  ROS: Pertinent ROS in HPI  Physical Exam: BP (!) 148/61   Pulse 75   Ht 5' 10"  (1.778 m)   Wt 165 lb (74.8 kg)   BMI 23.68 kg/m   Constitutional:  Well nourished. Alert and oriented, No acute distress. HEENT: [place]  AT, mask in place.  Trachea midline Cardiovascular: No clubbing, cyanosis, or edema. Respiratory: Normal respiratory effort, no increased work of breathing. Neurologic: Grossly intact, no focal deficits, moving all 4 extremities. Psychiatric: Normal mood and affect.  Laboratory Data: WBC (White Blood Cell Count)  4.1 - 10.2 10^3/uL 11.7 High    RBC (Red Blood Cell Count) 4.69 - 6.13 10^6/uL 3.15 Low    Hemoglobin 14.1 - 18.1 gm/dL 9.4 Low    Hematocrit 40.0 - 52.0 % 30.6 Low    MCV (Mean Corpuscular Volume) 80.0 - 100.0 fl 97.1   MCH (Mean Corpuscular Hemoglobin) 27.0 - 31.2 pg 29.8   MCHC (Mean Corpuscular Hemoglobin Concentration) 32.0 - 36.0 gm/dL 30.7 Low    Platelet Count 150 - 450 10^3/uL 183   RDW-CV (Red Cell Distribution Width) 11.6 - 14.8 % 14.2   MPV (Mean Platelet Volume) 9.4 - 12.4 fl 10.7   Neutrophils 1.50 - 7.80 10^3/uL 9.35 High    Lymphocytes 1.00 - 3.60 10^3/uL 0.96 Low    Monocytes 0.00 - 1.50 10^3/uL 0.83   Eosinophils 0.00 - 0.55 10^3/uL 0.42   Basophils 0.00 - 0.09 10^3/uL 0.06   Neutrophil % 32.0 - 70.0 % 80.2 High    Lymphocyte % 10.0 - 50.0 % 8.2 Low    Monocyte % 4.0 - 13.0 % 7.1   Eosinophil % 1.0 - 5.0 % 3.6   Basophil% 0.0 - 2.0 % 0.5   Immature Granulocyte % <=0.7 % 0.4   Immature Granulocyte Count <=0.06 10^3/L 0.05   Resulting Agency  [facility]  Specimen Collected: 09/16/21 10:12 Last Resulted: 09/16/21 11:59  Received From: [facility]  Result Received: 09/20/21 10:24   Creatinine 0.7 - 1.3 mg/dL 1.4 High    Glomerular Filtration Rate (eGFR), MDRD Estimate >60 mL/min/1.73sq m 49 Low    Resulting Agency  [facility]  Specimen Collected: 09/16/21 10:12 Last Resulted: 09/16/21 15:59  Received From: [facility]  Result Received: 09/20/21 10:24  I have reviewed the labs.   Pertinent Imaging: Results for TRAVEN, DAVIDS (MRN [id number]) as of 09/21/2021 09:51  Ref. Range  09/21/2021 09:37  Scan Result Unknown 9m    Assessment & Plan:    1. BPH with LUTS -PVR < 300 cc -symptoms - nocturia  -most bothersome symptoms are nocturia -continue conservative management, avoiding bladder irritants and timed voiding's -continue tamsulosin 0.4 mg daily  2. Nocturia - I explained to the patient that nocturia is often multi-factorial and difficult to treat.  Sleeping disorders, heart conditions, peripheral vascular disease, diabetes, an enlarged prostate for men, an urethral stricture causing bladder outlet obstruction and/or certain medications can contribute to nocturia. - I have suggested that the patient avoid caffeine after noon and alcohol in the evening.  He or she may also benefit from fluid restrictions after 6:00 in the evening and voiding just prior to bedtime. - I have explained that research studies have showed that over 84% of patients with sleep apnea reported frequent nighttime urination.   With sleep apnea, oxygen decreases, carbon dioxide increases, the blood become more acidic, the heart rate drops and blood vessels in the lung constrict.  The body is then alerted that something is very wrong. The sleeper must wake enough to reopen the airway. By this time, the heart is racing and experiences a false signal of fluid overload. The heart excretes a hormone-like protein that tells the body to get rid of sodium and water, resulting in nocturia. -  I also informed the patient that a recent study noted that decreasing sodium intake to 2.3 grams daily, if they don't have issues with hyponatremia, can also reduce the number of nightly voids - The patient may benefit  from a discussion with his or her primary care physician to see if he or she has risk factors for sleep apnea or other sleep disturbances and obtaining a sleep study.   -Also discussed with him that at this time we could have a trial of Myrbetriq 50 mg daily to see if he can get better control with his  urinary issues or schedule him for cystoscopy to evaluate for any irritative process that may be causing the nocturia -He would like to have a trial of Myrbetriq 50 mg daily at this time -I explained that nocturia is a very difficult issue to treat as it is multifactorial in nature and that we may only achieve reduction in nighttime urination versus completely void of nocturia episodes -I explained that if the Myrbetriq 50 mg daily was ineffective and he pursued the cystoscopy and it was negative, he would need to follow-up with his PCP to explore other etiologies for the nocturia    Return in about 3 weeks (around 10/12/2021) for IPSS and PVR.  These notes generated with voice recognition software. I apologize for typographical errors.  Zara Council, PA-C  [facility] [address]  [place], [place] [address] [phone]

## 2021-09-21 ENCOUNTER — Other Ambulatory Visit: Payer: Self-pay

## 2021-09-21 ENCOUNTER — Ambulatory Visit (INDEPENDENT_AMBULATORY_CARE_PROVIDER_SITE_OTHER): Payer: Medicare Other | Admitting: Urology

## 2021-09-21 ENCOUNTER — Encounter: Payer: Self-pay | Admitting: Urology

## 2021-09-21 VITALS — BP 148/61 | HR 75 | Ht 70.0 in | Wt 165.0 lb

## 2021-09-21 DIAGNOSIS — R351 Nocturia: Secondary | ICD-10-CM | POA: Diagnosis not present

## 2021-09-21 DIAGNOSIS — N401 Enlarged prostate with lower urinary tract symptoms: Secondary | ICD-10-CM | POA: Diagnosis not present

## 2021-09-21 DIAGNOSIS — R350 Frequency of micturition: Secondary | ICD-10-CM | POA: Diagnosis not present

## 2021-09-21 LAB — BLADDER SCAN AMB NON-IMAGING

## 2021-09-21 MED ORDER — MIRABEGRON ER 50 MG PO TB24: 50.0000 mg | ORAL_TABLET | Freq: Every day | ORAL | 0 refills | Status: DC

## 2021-10-05 ENCOUNTER — Other Ambulatory Visit: Payer: Self-pay

## 2021-10-05 ENCOUNTER — Emergency Department: Payer: Medicare Other

## 2021-10-05 ENCOUNTER — Inpatient Hospital Stay
Admission: EM | Admit: 2021-10-05 | Discharge: 2021-10-08 | DRG: 280 | Disposition: A | Discharge status: Hospice | Payer: Medicare Other | Attending: Internal Medicine | Admitting: Internal Medicine

## 2021-10-05 DIAGNOSIS — R55 Syncope and collapse: Secondary | ICD-10-CM

## 2021-10-05 DIAGNOSIS — I959 Hypotension, unspecified: Secondary | ICD-10-CM | POA: Diagnosis present

## 2021-10-05 DIAGNOSIS — D631 Anemia in chronic kidney disease: Secondary | ICD-10-CM | POA: Diagnosis present

## 2021-10-05 DIAGNOSIS — Z87891 Personal history of nicotine dependence: Secondary | ICD-10-CM

## 2021-10-05 DIAGNOSIS — J8410 Pulmonary fibrosis, unspecified: Secondary | ICD-10-CM | POA: Diagnosis present

## 2021-10-05 DIAGNOSIS — Z993 Dependence on wheelchair: Secondary | ICD-10-CM

## 2021-10-05 DIAGNOSIS — M3320 Polymyositis, organ involvement unspecified: Secondary | ICD-10-CM | POA: Diagnosis present

## 2021-10-05 DIAGNOSIS — Z8249 Family history of ischemic heart disease and other diseases of the circulatory system: Secondary | ICD-10-CM

## 2021-10-05 DIAGNOSIS — Z66 Do not resuscitate: Secondary | ICD-10-CM | POA: Diagnosis present

## 2021-10-05 DIAGNOSIS — Z888 Allergy status to other drugs, medicaments and biological substances status: Secondary | ICD-10-CM

## 2021-10-05 DIAGNOSIS — R6510 Systemic inflammatory response syndrome (SIRS) of non-infectious origin without acute organ dysfunction: Secondary | ICD-10-CM

## 2021-10-05 DIAGNOSIS — W19XXXA Unspecified fall, initial encounter: Secondary | ICD-10-CM | POA: Diagnosis present

## 2021-10-05 DIAGNOSIS — N183 Chronic kidney disease, stage 3 unspecified: Secondary | ICD-10-CM | POA: Diagnosis present

## 2021-10-05 DIAGNOSIS — N179 Acute kidney failure, unspecified: Secondary | ICD-10-CM | POA: Diagnosis present

## 2021-10-05 DIAGNOSIS — J810 Acute pulmonary edema: Secondary | ICD-10-CM | POA: Diagnosis not present

## 2021-10-05 DIAGNOSIS — Z9079 Acquired absence of other genital organ(s): Secondary | ICD-10-CM

## 2021-10-05 DIAGNOSIS — E119 Type 2 diabetes mellitus without complications: Secondary | ICD-10-CM

## 2021-10-05 DIAGNOSIS — J9621 Acute and chronic respiratory failure with hypoxia: Secondary | ICD-10-CM | POA: Diagnosis present

## 2021-10-05 DIAGNOSIS — I350 Nonrheumatic aortic (valve) stenosis: Secondary | ICD-10-CM | POA: Diagnosis present

## 2021-10-05 DIAGNOSIS — Z515 Encounter for palliative care: Secondary | ICD-10-CM

## 2021-10-05 DIAGNOSIS — E1122 Type 2 diabetes mellitus with diabetic chronic kidney disease: Secondary | ICD-10-CM | POA: Diagnosis present

## 2021-10-05 DIAGNOSIS — E8809 Other disorders of plasma-protein metabolism, not elsewhere classified: Secondary | ICD-10-CM | POA: Diagnosis present

## 2021-10-05 DIAGNOSIS — Z79899 Other long term (current) drug therapy: Secondary | ICD-10-CM

## 2021-10-05 DIAGNOSIS — I130 Hypertensive heart and chronic kidney disease with heart failure and stage 1 through stage 4 chronic kidney disease, or unspecified chronic kidney disease: Secondary | ICD-10-CM | POA: Diagnosis present

## 2021-10-05 DIAGNOSIS — J189 Pneumonia, unspecified organism: Secondary | ICD-10-CM | POA: Diagnosis present

## 2021-10-05 DIAGNOSIS — Z7982 Long term (current) use of aspirin: Secondary | ICD-10-CM

## 2021-10-05 DIAGNOSIS — J811 Chronic pulmonary edema: Secondary | ICD-10-CM | POA: Diagnosis present

## 2021-10-05 DIAGNOSIS — Z7983 Long term (current) use of bisphosphonates: Secondary | ICD-10-CM

## 2021-10-05 DIAGNOSIS — I5033 Acute on chronic diastolic (congestive) heart failure: Secondary | ICD-10-CM | POA: Diagnosis present

## 2021-10-05 DIAGNOSIS — Z9981 Dependence on supplemental oxygen: Secondary | ICD-10-CM

## 2021-10-05 DIAGNOSIS — Z7952 Long term (current) use of systemic steroids: Secondary | ICD-10-CM | POA: Diagnosis not present

## 2021-10-05 DIAGNOSIS — R531 Weakness: Secondary | ICD-10-CM

## 2021-10-05 DIAGNOSIS — I214 Non-ST elevation (NSTEMI) myocardial infarction: Principal | ICD-10-CM | POA: Diagnosis present

## 2021-10-05 DIAGNOSIS — Z7984 Long term (current) use of oral hypoglycemic drugs: Secondary | ICD-10-CM

## 2021-10-05 DIAGNOSIS — I272 Pulmonary hypertension, unspecified: Secondary | ICD-10-CM | POA: Diagnosis present

## 2021-10-05 DIAGNOSIS — A419 Sepsis, unspecified organism: Secondary | ICD-10-CM | POA: Diagnosis present

## 2021-10-05 DIAGNOSIS — Z882 Allergy status to sulfonamides status: Secondary | ICD-10-CM

## 2021-10-05 DIAGNOSIS — I5021 Acute systolic (congestive) heart failure: Secondary | ICD-10-CM | POA: Diagnosis not present

## 2021-10-05 DIAGNOSIS — Z20822 Contact with and (suspected) exposure to covid-19: Secondary | ICD-10-CM | POA: Diagnosis present

## 2021-10-05 DIAGNOSIS — J9610 Chronic respiratory failure, unspecified whether with hypoxia or hypercapnia: Secondary | ICD-10-CM | POA: Diagnosis present

## 2021-10-05 LAB — BASIC METABOLIC PANEL
Anion gap: 13 (ref 5–15)
BUN: 33 mg/dL — ABNORMAL HIGH (ref 8–23)
CO2: 31 mmol/L (ref 22–32)
Calcium: 10.1 mg/dL (ref 8.9–10.3)
Chloride: 90 mmol/L — ABNORMAL LOW (ref 98–111)
Creatinine, Ser: 1.27 mg/dL — ABNORMAL HIGH (ref 0.61–1.24)
GFR, Estimated: 57 mL/min — ABNORMAL LOW (ref >60)
Glucose, Bld: 204 mg/dL — ABNORMAL HIGH (ref 70–99)
Potassium: 4.9 mmol/L (ref 3.5–5.1)
Sodium: 134 mmol/L — ABNORMAL LOW (ref 135–145)

## 2021-10-05 LAB — RESP PANEL BY RT-PCR (FLU A&B, COVID) ARPGX2
Influenza A by PCR: NEGATIVE
Influenza B by PCR: NEGATIVE
SARS Coronavirus 2 by RT PCR: NEGATIVE

## 2021-10-05 LAB — HEPATIC FUNCTION PANEL
ALT: 61 U/L — ABNORMAL HIGH (ref 0–44)
AST: 66 U/L — ABNORMAL HIGH (ref 15–41)
Albumin: 2.7 g/dL — ABNORMAL LOW (ref 3.5–5.0)
Alkaline Phosphatase: 38 U/L (ref 38–126)
Bilirubin, Direct: <0.1 mg/dL (ref 0.0–0.2)
Total Bilirubin: 0.4 mg/dL (ref 0.3–1.2)
Total Protein: 6 g/dL — ABNORMAL LOW (ref 6.5–8.1)

## 2021-10-05 LAB — URINALYSIS, COMPLETE (UACMP) WITH MICROSCOPIC
Bacteria, UA: NONE SEEN
Bilirubin Urine: NEGATIVE
Glucose, UA: NEGATIVE mg/dL
Hgb urine dipstick: NEGATIVE
Ketones, ur: NEGATIVE mg/dL
Leukocytes,Ua: NEGATIVE
Nitrite: NEGATIVE
Protein, ur: 30 mg/dL — AB
Specific Gravity, Urine: 1.024 (ref 1.005–1.030)
Squamous Epithelial / HPF: NONE SEEN (ref 0–5)
pH: 6 (ref 5.0–8.0)

## 2021-10-05 LAB — PROCALCITONIN: Procalcitonin: 0.11 ng/mL

## 2021-10-05 LAB — CBC
HCT: 28.9 % — ABNORMAL LOW (ref 39.0–52.0)
Hemoglobin: 9.4 g/dL — ABNORMAL LOW (ref 13.0–17.0)
MCH: 30.3 pg (ref 26.0–34.0)
MCHC: 32.5 g/dL (ref 30.0–36.0)
MCV: 93.2 fL (ref 80.0–100.0)
Platelets: 222 10*3/uL (ref 150–400)
RBC: 3.1 MIL/uL — ABNORMAL LOW (ref 4.22–5.81)
RDW: 14.2 % (ref 11.5–15.5)
WBC: 17.7 10*3/uL — ABNORMAL HIGH (ref 4.0–10.5)
nRBC: 0 % (ref 0.0–0.2)

## 2021-10-05 LAB — LACTIC ACID, PLASMA
Lactic Acid, Venous: 1.8 mmol/L (ref 0.5–1.9)
Lactic Acid, Venous: 1.9 mmol/L (ref 0.5–1.9)

## 2021-10-05 LAB — TROPONIN I (HIGH SENSITIVITY)
Troponin I (High Sensitivity): 3563 ng/L (ref <18)
Troponin I (High Sensitivity): 4534 ng/L (ref <18)

## 2021-10-05 LAB — PROTIME-INR
INR: 1 (ref 0.8–1.2)
Prothrombin Time: 13 seconds (ref 11.4–15.2)

## 2021-10-05 LAB — BRAIN NATRIURETIC PEPTIDE: B Natriuretic Peptide: 859.7 pg/mL — ABNORMAL HIGH (ref 0.0–100.0)

## 2021-10-05 LAB — APTT: aPTT: 33 seconds (ref 24–36)

## 2021-10-05 MED ORDER — FISH OIL 1000 MG PO CAPS: 1.0000 | ORAL_CAPSULE | Freq: Two times a day (BID) | ORAL | Status: DC

## 2021-10-05 MED ORDER — ASPIRIN 81 MG PO CHEW: 324.0000 mg | CHEWABLE_TABLET | ORAL | Status: DC

## 2021-10-05 MED ORDER — ASPIRIN 300 MG RE SUPP: 300.0000 mg | RECTAL | Status: DC

## 2021-10-05 MED ORDER — TAMSULOSIN HCL 0.4 MG PO CAPS
0.4000 mg | ORAL_CAPSULE | Freq: Every day | ORAL | Status: DC
  Administered 2021-10-05: 0.4 mg via ORAL
  Filled 2021-10-05: qty 1

## 2021-10-05 MED ORDER — MIDODRINE HCL 5 MG PO TABS
5.0000 mg | ORAL_TABLET | Freq: Once | ORAL | Status: AC
  Administered 2021-10-05: 5 mg via ORAL
  Filled 2021-10-05: qty 1

## 2021-10-05 MED ORDER — HYDROCORTISONE SOD SUC (PF) 100 MG IJ SOLR
100.0000 mg | Freq: Three times a day (TID) | INTRAMUSCULAR | Status: DC
  Administered 2021-10-05 – 2021-10-06 (×3): 100 mg via INTRAVENOUS
  Filled 2021-10-05 (×5): qty 2

## 2021-10-05 MED ORDER — HEPARIN (PORCINE) 25000 UT/250ML-% IV SOLN
1050.0000 [IU]/h | INTRAVENOUS | Status: DC
  Administered 2021-10-05: 900 [IU]/h via INTRAVENOUS
  Filled 2021-10-05 (×2): qty 250

## 2021-10-05 MED ORDER — CALCIUM CARBONATE-VITAMIN D 600-400 MG-UNIT PO TABS: 1.0000 | ORAL_TABLET | Freq: Every day | ORAL | Status: DC

## 2021-10-05 MED ORDER — NITROGLYCERIN 0.4 MG SL SUBL: 0.4000 mg | SUBLINGUAL_TABLET | SUBLINGUAL | Status: DC | PRN

## 2021-10-05 MED ORDER — ACETAMINOPHEN 325 MG PO TABS: 650.0000 mg | ORAL_TABLET | ORAL | Status: DC | PRN

## 2021-10-05 MED ORDER — ASPIRIN EC 81 MG PO TBEC: 81.0000 mg | DELAYED_RELEASE_TABLET | Freq: Every day | ORAL | Status: DC

## 2021-10-05 MED ORDER — HEPARIN BOLUS VIA INFUSION
4000.0000 [IU] | Freq: Once | INTRAVENOUS | Status: AC
  Administered 2021-10-05: 4000 [IU] via INTRAVENOUS
  Filled 2021-10-05: qty 4000

## 2021-10-05 MED ORDER — CALCIUM CARBONATE-VITAMIN D 500-200 MG-UNIT PO TABS: 1.0000 | ORAL_TABLET | Freq: Every day | ORAL | Status: DC

## 2021-10-05 MED ORDER — SODIUM CHLORIDE 0.9 % IV SOLN
2.0000 g | INTRAVENOUS | Status: DC
  Administered 2021-10-05 – 2021-10-06 (×2): 2 g via INTRAVENOUS
  Filled 2021-10-05 (×2): qty 20

## 2021-10-05 MED ORDER — FUROSEMIDE 10 MG/ML IJ SOLN
4.0000 mg/h | INTRAVENOUS | Status: DC
  Administered 2021-10-06: 8 mg/h via INTRAVENOUS
  Filled 2021-10-05: qty 20

## 2021-10-05 MED ORDER — FUROSEMIDE 10 MG/ML IJ SOLN
40.0000 mg | Freq: Two times a day (BID) | INTRAMUSCULAR | Status: DC
  Filled 2021-10-05: qty 4

## 2021-10-05 MED ORDER — LACTATED RINGERS IV SOLN: INTRAVENOUS | Status: DC

## 2021-10-05 MED ORDER — ALBUMIN HUMAN 25 % IV SOLN
25.0000 g | Freq: Four times a day (QID) | INTRAVENOUS | Status: DC
  Administered 2021-10-05 – 2021-10-06 (×3): 25 g via INTRAVENOUS
  Filled 2021-10-05 (×5): qty 100

## 2021-10-05 MED ORDER — ONDANSETRON HCL 4 MG/2ML IJ SOLN: 4.0000 mg | Freq: Four times a day (QID) | INTRAMUSCULAR | Status: DC | PRN

## 2021-10-05 MED ORDER — CALCIUM CARBONATE-VITAMIN D 500-200 MG-UNIT PO TABS
1.0000 | ORAL_TABLET | Freq: Every day | ORAL | Status: DC
  Administered 2021-10-06: 1 via ORAL
  Filled 2021-10-05: qty 1

## 2021-10-05 MED ORDER — PANTOPRAZOLE SODIUM 40 MG PO TBEC
40.0000 mg | DELAYED_RELEASE_TABLET | Freq: Every day | ORAL | Status: DC
  Administered 2021-10-05 – 2021-10-06 (×2): 40 mg via ORAL
  Filled 2021-10-05 (×2): qty 1

## 2021-10-05 MED ORDER — ASPIRIN 81 MG PO CHEW
324.0000 mg | CHEWABLE_TABLET | Freq: Once | ORAL | Status: AC
  Administered 2021-10-05: 324 mg via ORAL
  Filled 2021-10-05: qty 4

## 2021-10-05 MED ORDER — MIRABEGRON ER 50 MG PO TB24
50.0000 mg | ORAL_TABLET | Freq: Every day | ORAL | Status: DC
  Administered 2021-10-06: 50 mg via ORAL
  Filled 2021-10-05 (×3): qty 1

## 2021-10-05 MED ORDER — ASPIRIN EC 81 MG PO TBEC
81.0000 mg | DELAYED_RELEASE_TABLET | Freq: Every day | ORAL | Status: DC
  Filled 2021-10-05: qty 1

## 2021-10-05 MED ORDER — SODIUM CHLORIDE 0.9 % IV SOLN
500.0000 mg | INTRAVENOUS | Status: DC
Start: 2021-10-05 — End: 2021-10-06
  Administered 2021-10-05 – 2021-10-06 (×2): 500 mg via INTRAVENOUS
  Filled 2021-10-05 (×2): qty 500

## 2021-10-05 MED ORDER — INSULIN ASPART 100 UNIT/ML IJ SOLN
0.0000 [IU] | Freq: Three times a day (TID) | INTRAMUSCULAR | Status: DC
  Administered 2021-10-06 (×2): 3 [IU] via SUBCUTANEOUS
  Filled 2021-10-05: qty 1

## 2021-10-05 MED ORDER — MAGNESIUM OXIDE 400 MG PO TABS
400.0000 mg | ORAL_TABLET | Freq: Two times a day (BID) | ORAL | Status: DC
  Administered 2021-10-05 – 2021-10-06 (×2): 400 mg via ORAL
  Filled 2021-10-05 (×5): qty 1

## 2021-10-05 MED ORDER — SODIUM CHLORIDE 0.9 % IV BOLUS
1000.0000 mL | Freq: Once | INTRAVENOUS | Status: AC
  Administered 2021-10-05: 1000 mL via INTRAVENOUS

## 2021-10-05 MED ORDER — VITAMIN D3 25 MCG (1000 UNIT) PO TABS
1000.0000 [IU] | ORAL_TABLET | Freq: Every day | ORAL | Status: DC
  Administered 2021-10-05 – 2021-10-06 (×2): 1000 [IU] via ORAL
  Filled 2021-10-05 (×4): qty 1

## 2021-10-05 MED ORDER — OMEGA-3-ACID ETHYL ESTERS 1 G PO CAPS
1.0000 g | ORAL_CAPSULE | Freq: Two times a day (BID) | ORAL | Status: DC
  Administered 2021-10-06: 1 g via ORAL
  Filled 2021-10-05 (×3): qty 1

## 2021-10-05 NOTE — Consult Note (Signed)
CODE SEPSIS - PHARMACY COMMUNICATION  **Broad Spectrum Antibiotics should be administered within 1 hour of Sepsis diagnosis**  Time Code Sepsis Called/Page Received: 1555  Antibiotics Ordered: azithromycin and ceftriaxone   Time of 1st antibiotic administration: 1652   Doloris Hall, PharmD Pharmacy Resident  10/05/2021 3:55 PM

## 2021-10-05 NOTE — H&P (Addendum)
History and Physical    Rahsaan Weakland JKD:[id number] DOB: 1941/06/14 DOA: 10/05/2021  PCP: Dione Housekeeper, MD   Patient coming from: Home  I have personally briefly reviewed patient's old medical records in [facility]  Chief Complaint: Weakness  Most of the history was obtained from patient's wife who was at the bedside  HPI: Laron Boorman is a 80 y.o. male with medical history significant for moderate stenosis, diabetes mellitus, hypertension, polymyositis, interstitial lung disease with chronic respiratory failure on 4 L of oxygen continuous and hypertension who was brought to the ER by his wife for evaluation of 2 near syncopal episodes on the day of his admission. At baseline patient is wheelchair-bound but is able to transfer.  His wife states that he woke up this morning and attempted to get out of bed into his wheelchair when he fell due to weakness.  He did not hit his head or have any loss of consciousness but she states that he was very drowsy. She states that patient ate breakfast and felt nauseous.  While taking him to the bathroom he had another near syncopal episode and according to the wife she heard gurgling breathing. He has a dry cough but denies having any fever or chills.  At baseline he is usually short of breath and thinks that this his respiratory status is worse.  He has no abdominal pain and denies having any changes in his bowel habits.  He denies feeling dizzy or lightheaded and denies having any urinary symptoms.  He has no palpitations, no diaphoresis, no leg swelling, no blurred vision or any focal deficits. Labs show sodium 134, potassium 4.9, chloride 90, bicarb 31, glucose 204, BUN 33, creatinine 1.27, calcium 10.1, troponin 3563, lactic acid 1.8, white count 17.7, hemoglobin 9.4, hematocrit 28.9, MCV 93.2, RDW 14.2, platelet count 222 Urinalysis is sterile CT scan of the head without contrast shows no acute intracranial abnormality Chest  x-ray reviewed by me shows interval development of bilateral pulmonary opacities which may represent superimposed pulmonary edema and/or multifocal pneumonia.  Changes of chronic interstitial lung disease/pulmonary fibrosis. Twelve-lead EKG reviewed by me shows sinus tachycardia with LVH.  ED Course: Patient is a 80 year old male who presents to the ER for evaluation of 2 near syncopal episodes and an episode of emesis. Labs show leukocytosis and an elevated troponin level greater than 3000 Chest x-ray suggestive of possible pulmonary edema versus multifocal pneumonia He was noted to be hypotensive with systolic blood pressure of upon arrival to the ER, he was also tachycardic and tachypneic. Patient received 1 L IV fluid bolus as well as a dose of IV Zithromax and Rocephin.  He was also started on a heparin drip for presumed non-ST elevation MI. He will be admitted to the hospital for further evaluation.     Review of Systems: As per HPI otherwise all other systems reviewed and negative.    Past Medical History:  Diagnosis Date   Diabetes mellitus without complication (HCC)    Hypertension    Polymyositis (HCC)    Pulmonary fibrosis (HCC)     Past Surgical History:  Procedure Laterality Date   COLONOSCOPY WITH PROPOFOL N/A 05/25/2016   Procedure: COLONOSCOPY WITH PROPOFOL;  Surgeon: Midge Minium, MD;  Location: [facility] ENDOSCOPY;  Service: Endoscopy;  Laterality: N/A;   ESOPHAGOGASTRODUODENOSCOPY (EGD) WITH PROPOFOL N/A 05/25/2016   Procedure: ESOPHAGOGASTRODUODENOSCOPY (EGD) WITH PROPOFOL;  Surgeon: Midge Minium, MD;  Location: [facility] ENDOSCOPY;  Service: Endoscopy;  Laterality: N/A;  EYE SURGERY     HERNIA REPAIR     TESTICLE REMOVAL       reports that he has quit smoking. He has never used smokeless tobacco. He reports that he does not drink alcohol and does not use drugs.  Allergies  Allergen Reactions   Atorvastatin     Other reaction(s): Muscle Pain Other reaction(s):  Muscle Pain Other reaction(s): Muscle Pain Other reaction(s): Muscle Pain   Statins Other (See Comments)    Polymyositis. 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     Montelukast     Other reaction(s): Other (See Comments)   Montelukast Sodium     Other reaction(s): Unknown   Other Diarrhea   Sulfa Antibiotics Diarrhea   Sulfamethoxazole-Trimethoprim Diarrhea    Family History  Problem Relation Age of Onset   CAD Mother       Prior to Admission medications   Medication Sig Start Date End Date Taking? Authorizing Provider  aspirin EC 81 MG tablet Take 81 mg by mouth daily.    Yes [provider]  Calcium Carbonate-Vitamin D 600-400 MG-UNIT tablet Take 1 tablet by mouth daily.    Yes [provider]  Cholecalciferol 25 MCG (1000 UT) tablet Take 1,000 Units by mouth daily.    Yes [provider]  esomeprazole (NEXIUM) 20 MG capsule Take 20 mg by mouth daily.    Yes [provider]  Flaxseed, Linseed, (FLAXSEED OIL) 1000 MG CAPS Take 1 capsule by mouth 2 (two) times daily.    Yes [provider]  glipiZIDE (GLUCOTROL XL) 10 MG 24 hr tablet Take 10 mg by mouth daily as needed. If sugar is higher > 150 05/18/16  Yes [provider]  magnesium oxide (MAG-OX) 400 MG tablet Take 0-800 mg by mouth See admin instructions. 450 mg in the morning and 350 in the evening   Yes [provider]  mirabegron ER (MYRBETRIQ) 50 MG TB24 tablet Take 1 tablet (50 mg total) by mouth daily. 09/21/21  Yes McGowan, Carollee Herter A, PA-C  Omega-3 Fatty Acids (FISH OIL) 1000 MG CAPS Take 1 capsule by mouth 2 (two) times daily.   Yes [provider]  predniSONE (DELTASONE) 5 MG tablet Take 15 mg by mouth daily. 09/01/21  Yes [provider]  tamsulosin (FLOMAX) 0.4 MG CAPS capsule Take 0.4 mg by mouth daily after supper. 04/08/20  Yes [provider]  alendronate (FOSAMAX) 70 MG tablet Take 70 mg by mouth once a week. 04/14/20   [provider]  enalapril (VASOTEC) 5 MG tablet Take 1 tablet (5 mg total) by mouth daily. 03/18/21 04/17/21  Darlin Drop, DO    Physical Exam: Vitals:   10/05/21 1336 10/05/21 1340 10/05/21 1400 10/05/21 1649  BP: (!) 89/72 94/73 103/61   Pulse:  (!) 101 99   Resp:  (!) 23 (!) 7 (!) 22  Temp:    99.4 F (37.4 C)  TempSrc:    Oral  SpO2:  100% 100%   Weight:      Height:         Vitals:   10/05/21 1336 10/05/21 1340 10/05/21 1400 10/05/21 1649  BP: (!) 89/72 94/73 103/61   Pulse:  (!) 101 99   Resp:  (!) 23 (!) 7 (!) 22  Temp:    99.4 F (37.4 C)  TempSrc:    Oral  SpO2:  100% 100%   Weight:      Height:  Constitutional: Alert and oriented x 3 .  Chronically ill-appearing total HEENT:      Head: Normocephalic and atraumatic.         Eyes: PERLA, EOMI, Conjunctivae are normal. Sclera is non-icteric.       Mouth/Throat: Mucous membranes are moist.       Neck: Supple with no signs of meningismus. Cardiovascular: Tachycardic. No murmurs, gallops, or rubs. 2+ symmetrical distal pulses are present . No JVD. No LE edema Respiratory: Tachypnea.bilateral air entry in both fields.  Scattered rhonchi and crackles. No wheezes  Gastrointestinal: Soft, non tender, and non distended with positive bowel sounds.  Umbilical hernia Genitourinary: No CVA tenderness. Musculoskeletal: Nontender with normal range of motion in all extremities. No cyanosis, or erythema of extremities. Neurologic:  Face is symmetric. Moving all extremities. No gross focal neurologic deficits.  Generalized weakness Skin: Skin is warm, dry.  No rash or ulcers.  Skin tear over right elbow (POA) Psychiatric: Mood and affect are normal    Labs on Admission: I have personally reviewed following labs and imaging studies  CBC: Recent Labs  Lab 10/05/21 1323  WBC 17.7*  HGB 9.4*  HCT 28.9*  MCV 93.2  PLT 222   Basic Metabolic Panel: Recent Labs  Lab 10/05/21 1323  NA 134*  K 4.9  CL 90*  CO2 31   GLUCOSE 204*  BUN 33*  CREATININE 1.27*  CALCIUM 10.1   GFR: Estimated Creatinine Clearance: 48.7 mL/min (A) (by C-G formula based on SCr of 1.27 mg/dL (H)). Liver Function Tests: No results for input(s): AST, ALT, ALKPHOS, BILITOT, PROT, ALBUMIN in the last 168 hours. No results for input(s): LIPASE, AMYLASE in the last 168 hours. No results for input(s): AMMONIA in the last 168 hours. Coagulation Profile: Recent Labs  Lab 10/05/21 1554  INR 1.0   Cardiac Enzymes: No results for input(s): CKTOTAL, CKMB, CKMBINDEX, TROPONINI in the last 168 hours. BNP (last 3 results) No results for input(s): PROBNP in the last 8760 hours. HbA1C: No results for input(s): HGBA1C in the last 72 hours. CBG: No results for input(s): GLUCAP in the last 168 hours. Lipid Profile: No results for input(s): CHOL, HDL, LDLCALC, TRIG, CHOLHDL, LDLDIRECT in the last 72 hours. Thyroid Function Tests: No results for input(s): TSH, T4TOTAL, FREET4, T3FREE, THYROIDAB in the last 72 hours. Anemia Panel: No results for input(s): VITAMINB12, FOLATE, FERRITIN, TIBC, IRON, RETICCTPCT in the last 72 hours. Urine analysis:    Component Value Date/Time   COLORURINE YELLOW (A) 10/05/2021 1438   APPEARANCEUR HAZY (A) 10/05/2021 1438   APPEARANCEUR Hazy (A) 04/13/2021 1347   LABSPEC 1.024 10/05/2021 1438   PHURINE 6.0 10/05/2021 1438   GLUCOSEU NEGATIVE 10/05/2021 1438   HGBUR NEGATIVE 10/05/2021 1438   BILIRUBINUR NEGATIVE 10/05/2021 1438   BILIRUBINUR Negative 04/13/2021 1347   KETONESUR NEGATIVE 10/05/2021 1438   PROTEINUR 30 (A) 10/05/2021 1438   NITRITE NEGATIVE 10/05/2021 1438   LEUKOCYTESUR NEGATIVE 10/05/2021 1438    Radiological Exams on Admission: CT HEAD WO CONTRAST  Result Date: 10/05/2021 CLINICAL DATA:  Altered mental status. EXAM: CT HEAD WITHOUT CONTRAST TECHNIQUE: Contiguous axial images were obtained from the base of the skull through the vertex without intravenous contrast. COMPARISON:   March 15, 2021. FINDINGS: Brain: Mild chronic ischemic white matter disease is noted. No mass effect or midline shift is noted. Ventricular size is within normal limits. There is no evidence of mass lesion, hemorrhage or acute infarction. Vascular: No hyperdense vessel or unexpected calcification. Skull: Normal.  Negative for fracture or focal lesion. Sinuses/Orbits: No acute finding. Other: None. IMPRESSION: No acute intracranial abnormality seen. Electronically Signed   By: Lupita Raider M.D.   On: 10/05/2021 15:22   DG Chest Portable 1 View  Result Date: 10/05/2021 CLINICAL DATA:  Possible sepsis. EXAM: PORTABLE CHEST 1 VIEW COMPARISON:  03/15/2021 FINDINGS: Stable cardiomediastinal contours. Chronic reticular interstitial opacities are again noted with a lower lung zone predominance compatible with pulmonary fibrosis. Since the previous exam there is been interval development of superimposed bilateral pulmonary opacities throughout both lungs which may represent superimposed pulmonary edema and/or multifocal pneumonia. Visualized osseous structures are unremarkable. IMPRESSION: 1. Interval development of bilateral pulmonary opacities which may represent superimposed pulmonary edema and/or multifocal pneumonia. 2. Changes of chronic interstitial lung disease/pulmonary fibrosis. Electronically Signed   By: Signa Kell M.D.   On: 10/05/2021 15:15     Assessment/Plan Principal Problem:   NSTEMI (non-ST elevated myocardial infarction) [facility]) Active Problems:   Generalized weakness   Fall   Diabetes mellitus without complication (HCC)   Polymyositis (HCC)   Pulmonary fibrosis (HCC)   Chronic respiratory failure (HCC)   Pulmonary edema     Patient is a 80 year old male who was brought into the ER by his wife for evaluation of 2 near syncopal episodes at home and 1 episode of emesis.  Patient found to have significantly elevated troponin levels consistent with a non-ST elevation MI   Non-ST  elevation MI Patient's cardiac risk factors include diabetes mellitus and hypertension Initial troponin is elevated at 3563 Place patient on aspirin and heparin drip initiated by the ER Obtain 2D echocardiogram to assess LVEF and rule out regional wall motion abnormality We will consult cardiology Cycle troponin levels    Acute pulmonary edema Most likely secondary to ischemic cardiomyopathy versus from known moderate aortic stenosis Patient noted to have bilateral pleural effusions and evidence of pulmonary edema on his CAT scan BNP is elevated Gentle IV diuresis due to relative hypotension Obtain 2D echocardiogram to assess LVEF    History of pulmonary fibrosis with chronic respiratory failure/polymyositis Patient is on chronic steroid therapy Will place patient on IV hydrocortisone 100 mg every 8 hours Continue oxygen supplementation at 4 L to maintain pulse oximetry greater than 92%     Diabetes mellitus Maintain consistent carbohydrate diet Glycemic control with sliding scale insulin     Near syncope/fall Most likely secondary to relative hypotension Place patient on fall precautions    Sepsis from community-acquired pneumonia As evidenced by tachycardia, tachypnea, leukocytosis and CXR findings suggestive of multifocal pneumonia. Patient received 1L IVF bolus but will not receive anymore IVF due to concerns of pulmonary edema We will treat patient empirically with IV Rocephin and Zithromax Follow up results of blood cultures   DVT prophylaxis: Heparin Code Status: DO NOT RESUSCITATE Family Communication: Greater than 50% of time was spent discussing patient's condition and plan of care with him and his wife at the bedside.  All questions and concerns have been addressed.  They verbalized understanding and agree with the plan.  CODE STATUS was discussed and patient is a DO NOT RESUSCITATE Disposition Plan: Back to previous home environment Consults called:  Cardiology Status:At the time of admission, it appears that the appropriate admission status for this patient is inpatient. This is judged to be reasonable and necessary to provide the required intensity of service to ensure the patient's safety given the presenting symptoms, physical exam findings, and initial radiographic and laboratory data in the  context of their comorbid conditions. Patient requires inpatient status due to high intensity of service, high risk for further deterioration and high frequency of surveillance required.     Lucile Shutters MD [facility]     10/05/2021, 5:14 PM

## 2021-10-05 NOTE — Consult Note (Signed)
[facility] Card[facility] Consultation Note  Patient ID: Lee Johnson, MRN: [id number], DOB/AGE: [birth year] 80 y.o. Admit date: 10/05/2021   Date of Consult: 10/05/2021 Primary Physician: Dione Housekeeper, MD Primary Cardiologist: [facility]  Chief Complaint:  Chief Complaint  Patient presents with   Near Syncope   Emesis   Reason for Consult:  Myocardial infarction  HPI: 80 y.o. 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  The patient has had some new onset of dizziness weakness and falls but did not hurt himself over the last 2 days.  This weakness also likely secondary to non-ST elevation myocardial infarction for which EKG shows normal sinus rhythm with left ventricular hypertrophy and poor R wave progression.  Troponin has peaked at 3563 consistent with non-ST elevation microinfarction.  CT scan previously has shown that he has severe three-vessel cardiac calcification.  Cardiac catheterization which was done on coronary artery assessment showed elevated pulmonary pressures and mild aortic valve stenosis.  Coronary arteries were not assessed.  Currently his chest x-ray suggest pulmonary fibrosis and/or possible pulmonary infiltrate with continued hypoxia.  His hemoglobin is 9.4 and his BNP is 859 consistent with congestive heart failure.  Glomerular filtration rate is 57.  The patient is not having any significant symptoms at this time and is relatively hemodynamically stable  Past Medical History:  Diagnosis Date   Diabetes mellitus without complication (HCC)    Hypertension    Polymyositis (HCC)    Pulmonary fibrosis (HCC)       Surgical History:  Past Surgical History:  Procedure Laterality Date   COLONOSCOPY WITH PROPOFOL N/A 05/25/2016   Procedure: COLONOSCOPY WITH PROPOFOL;   Surgeon: Midge Minium, MD;  Location: [facility] ENDOSCOPY;  Service: Endoscopy;  Laterality: N/A;   ESOPHAGOGASTRODUODENOSCOPY (EGD) WITH PROPOFOL N/A 05/25/2016   Procedure: ESOPHAGOGASTRODUODENOSCOPY (EGD) WITH PROPOFOL;  Surgeon: Midge Minium, MD;  Location: [facility] ENDOSCOPY;  Service: Endoscopy;  Laterality: N/A;   EYE SURGERY     HERNIA REPAIR     TESTICLE REMOVAL       Home Meds: Prior to Admission medications   Medication Sig Start Date End Date Taking? Authorizing Provider  aspirin EC 81 MG tablet Take 81 mg by mouth daily.    Yes [provider]  Calcium Carbonate-Vitamin D 600-400 MG-UNIT tablet Take 1 tablet by mouth daily.    Yes [provider]  Cholecalciferol 25 MCG (1000 UT) tablet Take 1,000 Units by mouth daily.    Yes [provider]  esomeprazole (NEXIUM) 20 MG capsule Take 20 mg by mouth daily.    Yes [provider]  Flaxseed, Linseed, (FLAXSEED OIL) 1000 MG CAPS Take 1 capsule by mouth 2 (two) times daily.    Yes [provider]  glipiZIDE (GLUCOTROL XL) 10 MG 24 hr tablet Take 10 mg by mouth daily as needed. If sugar is higher > 150 05/18/16  Yes [provider]  magnesium oxide (MAG-OX) 400 MG tablet Take 0-800 mg by mouth See admin instructions. 450 mg in the morning and 350 in the evening   Yes [provider]  mirabegron ER (MYRBETRIQ) 50 MG TB24 tablet Take 1 tablet (50 mg total) by mouth daily. 09/21/21  Yes McGowan, Carollee Herter A, PA-C  Omega-3 Fatty Acids (FISH OIL) 1000 MG CAPS Take 1 capsule by mouth 2 (two) times  daily.   Yes [provider]  predniSONE (DELTASONE) 5 MG tablet Take 15 mg by mouth daily. 09/01/21  Yes [provider]  tamsulosin (FLOMAX) 0.4 MG CAPS capsule Take 0.4 mg by mouth daily after supper. 04/08/20  Yes [provider]  alendronate (FOSAMAX) 70 MG tablet Take 70 mg by mouth once a week. 04/14/20   [provider]  enalapril (VASOTEC) 5 MG tablet Take 1 tablet  (5 mg total) by mouth daily. 03/18/21 04/17/21  Darlin Drop, DO    Inpatient Medications:   aspirin  324 mg Oral NOW   Or   aspirin  300 mg Rectal NOW   [START ON 10/06/2021] aspirin EC  81 mg Oral Daily   [START ON 10/06/2021] aspirin EC  81 mg Oral Daily   Calcium Carbonate-Vitamin D  1 tablet Oral Daily   Cholecalciferol  1,000 Units Oral Daily   Fish Oil  1 capsule Oral BID   magnesium oxide  0-800 mg Oral See admin instructions   mirabegron ER  50 mg Oral Daily   pantoprazole  40 mg Oral Daily   tamsulosin  0.4 mg Oral QPC supper    azithromycin     cefTRIAXone (ROCEPHIN)  IV 2 g (10/05/21 1652)   heparin 900 Units/hr (10/05/21 1648)   lactated ringers      Allergies:  Allergies  Allergen Reactions   Atorvastatin     Other reaction(s): Muscle Pain Other reaction(s): Muscle Pain Other reaction(s): Muscle Pain Other reaction(s): Muscle Pain   Statins Other (See Comments)    Polymyositis. 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     Montelukast     Other reaction(s): Other (See Comments)   Montelukast Sodium     Other reaction(s): Unknown   Other Diarrhea   Sulfa Antibiotics Diarrhea   Sulfamethoxazole-Trimethoprim Diarrhea    Social History   Socioeconomic History   Marital status: Married    Spouse name: Not on file   Number of children: Not on file   Years of education: Not on file   Highest education level: Not on file  Occupational History   Not on file  Tobacco Use   Smoking status: Former   Smokeless tobacco: Never  Vaping Use   Vaping Use: Never used  Substance and Sexual Activity   Alcohol use: No   Drug use: No   Sexual activity: Never  Other Topics Concern   Not on file  Social History Narrative   Not on file   Social Determinants of Health   Financial Resource Strain: Not on file  Food Insecurity: Not on file  Transportation Needs: Not on file  Physical Activity: Not on file  Stress: Not on file  Social Connections: Not on file  Intimate  Partner Violence: Not on file     Family History  Problem Relation Age of Onset   CAD Mother      Review of Systems Positive for shortness of breath weakness Negative for: General:  chills, fever, night sweats or weight changes.  Cardiovascular: PND orthopnea syncope dizziness  Dermatological skin lesions rashes Respiratory: Cough congestion Urologic: Frequent urination urination at night and hematuria Abdominal: negative for nausea, vomiting, diarrhea, bright red blood per rectum, melena, or hematemesis Neurologic: negative for visual changes, and/or hearing changes  All other systems reviewed and are otherwise negative except as noted above.  Labs: No results for input(s): CKTOTAL, CKMB, TROPONINI in the last 72 hours. Lab Results  Component Value Date  WBC 17.7 (H) 10/05/2021   HGB 9.4 (L) 10/05/2021   HCT 28.9 (L) 10/05/2021   MCV 93.2 10/05/2021   PLT 222 10/05/2021    Recent Labs  Lab 10/05/21 1323  NA 134*  K 4.9  CL 90*  CO2 31  BUN 33*  CREATININE 1.27*  CALCIUM 10.1  GLUCOSE 204*   Lab Results  Component Value Date   CHOL 208 (H) 04/19/2020   HDL 44 04/19/2020   LDLCALC 148 (H) 04/19/2020   TRIG 80 04/19/2020   No results found for: DDIMER  Radiology/Studies:  CT HEAD WO CONTRAST  Result Date: 10/05/2021 CLINICAL DATA:  Altered mental status. EXAM: CT HEAD WITHOUT CONTRAST TECHNIQUE: Contiguous axial images were obtained from the base of the skull through the vertex without intravenous contrast. COMPARISON:  March 15, 2021. FINDINGS: Brain: Mild chronic ischemic white matter disease is noted. No mass effect or midline shift is noted. Ventricular size is within normal limits. There is no evidence of mass lesion, hemorrhage or acute infarction. Vascular: No hyperdense vessel or unexpected calcification. Skull: Normal. Negative for fracture or focal lesion. Sinuses/Orbits: No acute finding. Other: None. IMPRESSION: No acute intracranial abnormality seen.  Electronically Signed   By: Lupita Raider M.D.   On: 10/05/2021 15:22   DG Chest Portable 1 View  Result Date: 10/05/2021 CLINICAL DATA:  Possible sepsis. EXAM: PORTABLE CHEST 1 VIEW COMPARISON:  03/15/2021 FINDINGS: Stable cardiomediastinal contours. Chronic reticular interstitial opacities are again noted with a lower lung zone predominance compatible with pulmonary fibrosis. Since the previous exam there is been interval development of superimposed bilateral pulmonary opacities throughout both lungs which may represent superimposed pulmonary edema and/or multifocal pneumonia. Visualized osseous structures are unremarkable. IMPRESSION: 1. Interval development of bilateral pulmonary opacities which may represent superimposed pulmonary edema and/or multifocal pneumonia. 2. Changes of chronic interstitial lung disease/pulmonary fibrosis. Electronically Signed   By: Signa Kell M.D.   On: 10/05/2021 15:15    EKG: Sinus tachycardia with left ventricular hypertrophy and poor R wave progression  Weights: Filed Weights   10/05/21 1315  Weight: 74.8 kg     Physical Exam: Blood pressure 103/61, pulse 99, temperature 99.4 F (37.4 C), temperature source Oral, resp. rate (!) 22, height 5\' 10"  (1.778 m), weight 74.8 kg, SpO2 100 %. Body mass index is 23.68 kg/m. General: Well developed, well nourished, in no acute distress. Head eyes ears nose throat: Normocephalic, atraumatic, sclera non-icteric, no xanthomas, nares are without discharge. No apparent thyromegaly and/or mass  Lungs: Normal respiratory effort.  Use wheezes, basilar rales, no rhonchi.  Heart: Irregular with normal S1 SOFT S2.  2-4+ aortic murmur gallop, no rub, PMI is normal size and placement, carotid upstroke normal without bruit, jugular venous pressure is normal Abdomen: Soft, non-tender, non-distended with normoactive bowel sounds. No hepatomegaly. No rebound/guarding. No obvious abdominal masses. Abdominal aorta is normal size  without bruit Extremities: Ace base edema. no cyanosis, no clubbing, no ulcers  Peripheral : 2+ bilateral upper extremity pulses, 2+ bilateral femoral pulses, 2+ bilateral dorsal pedal pulse Neuro: Alert and oriented. No facial asymmetry. No focal deficit. Moves all extremities spontaneously. Musculoskeletal: Normal muscle tone without kyphosis Psych:  Responds to questions appropriately with a normal affect.    Assessment: 80 year old male with severe pulmonary fibrosis and oxygen requiring pulmonary disease with moderate aortic valve stenosis and severe three-vessel calcification by CT scan with acute non-ST elevation myocardial infarction multifactorial in nature with chronic kidney disease stage III with a glomerular filtration  rate of 57 and anemia with a hemoglobin of 9.4 with signs and symptoms possible combined systolic diastolic dysfunction congestive heart failure  Plan: 1.  Serial ECG and enzymes to assess extent of myocardial infarction 2.  Echocardiogram for extent of valvular heart disease and LV dysfunction and further treatment options 3.  No additional medication management for above due to hypotension 4.  IV diuresis with Lasix for pulmonary edema 5.  This supportive care for pulmonary infiltrate and possible infection 6.  Supplemental oxygen for above 7.  We have discussed at length the possibility of cardiac catheterization to assess coronary anatomy and further treatment thereof is necessary.  Due to his severe nature as per above and the likelihood of not intervening upon coronary artery disease at this time unless cannot be medically managed will defer cardiac catheterization for now treat  Signed, Lamar Blinks M.D. [facility] Cardiology 10/05/2021, 5:13 PM

## 2021-10-05 NOTE — Progress Notes (Addendum)
    43 year ol male admitted with elevated troponin, elevated BNP, AKI, and pulmonary edema as well as infiltrate on chest  xray following 2 syncopal episodes at home. He has chronic resp failure requiring 4 L nasal cannula oxygen. He was seen by cardiology and was determined to have NSTEMI.  Initial EKG with NSR LVH and poor R wave progression.  CT scan showed 3 vessel coronary artery calcifications.   He has history significant for moderate aortic valve stenosis , pulmonary hypertension, pulmonary fibrosis, DM, HTN and BPH.  Per cardiology plan for echo to assess LV function and valvular heart disease. trend troponins, hold other meds due to hypotension, gentle diuresis and cardiac cath deferred at this time. Since admission, lasix  held and IV fluids at 150/h were started likely from hypotensive episode.  However , patient now with low systolic pressures but MAP above 65 and he is becoming increasing tachycradic.  Bedside patient reports he feels about the same as when he cam to ER. Denies SOB but describes breathing as a heaviness. Denies chest pain, abdominal pain or edema.  Assessment Neuro A & O x4, MAE CV - irregular sinus tach with  PAC's on bedside tele.  104-11,bpm, No peripheral edema.  Grade II systolic aortic murmur, no JVD Lungs - easy breathing effort. Some production with cough after deep breaths. Crackles up half way posteriorly. Abdomen - BS x , soft, non-tender   Plan NSTEMI- Orders to continue troponin trend - last at 1438 this afternoon 3563, no results prior today. Continue to monitor on tele. Continue heparin drip. Not on cholesterol lowering med secondary to polymyositis  Pulmonary edema - Midodrine for hypotension, check hepatic function, give albumin if appropriate and start lasix drip. Monitor I & O.  Sepsis from CAP - (meets sepsis criteria with leukocytosis, fever,  (99.4, around 5 pm this evening) and pneumonia as infection source.  Continue azithromycin  (monitor QT) and rocephin.   Follow up blood cultures (only 1 collected thus far) Incentive spirometer, supplemental oxygen to maintain sats above 92, Added urinary strept antigen and legionella to previously collected urine specimen. Lactic acid normal.  Hold further IV fluids Leukocytosis 17.7, diff not done -  check procal and trend  Transaminitis - likely hepatic congestion from fluid overload, monitor Hypoalbuminemia - augment with IV albumin for bp support with lasix drip  AKI - may be secondary to cardiorenal or intravascular low volume from low albumin state. Continue to monitor renal function with diuresis  Still awaiting step down bed in ICU. Patient is complex and in fragile state with high risk of decompensation/death from  acute heart failure, hypotension and pulmonary edema 30 minutes critical care time reviewing history and lab data, ordering/interpreting tests, assessment plan implementaion.  Wife updated at bedside. Both understand and agree with plan

## 2021-10-05 NOTE — Sepsis Progress Note (Signed)
eLink is following this Code Sepsis. °

## 2021-10-05 NOTE — Sepsis Progress Note (Signed)
Per secure chat with bedside RN [place], pt is a difficult stick and antibiotics were drawn before successful blood cultures were drawn.

## 2021-10-05 NOTE — ED Provider Notes (Addendum)
[facility] Emergency Department Provider Note ____________________________________________   Event Date/Time   First MD Initiated Contact with Patient 10/05/21 1338     (approximate)  I have reviewed the triage vital signs and the nursing notes.   HISTORY  Chief Complaint Near Syncope and Emesis    HPI Lee Johnson is a 80 y.o. male with PMH as noted below including diabetes and hypertension who presents with near syncope twice over the last few days, once 2 days ago and once today when he got out of bed.  In both episodes the patient got weak and lightheaded, and the wife noted sound of gurgling in his chest.  However, he did not fully lose consciousness.  In between, the patient has been somewhat weaker than usual but not confused.  The wife states he is currently at his baseline mental status.  The patient fell today but did not hit his head.  He denies any complaints currently other than a dry cough.  He denies shortness of breath, headache, fever or chills, vomiting, diarrhea, or any chest or abdominal pain.  Past Medical History:  Diagnosis Date   Diabetes mellitus without complication (HCC)    Hypertension    Polymyositis (HCC)    Pulmonary fibrosis (HCC)     Patient Active Problem List   Diagnosis Date Noted   Transaminitis 03/15/2021   Generalized weakness 04/18/2020   Fall 04/18/2020   Hyponatremia 04/18/2020   Elevated troponin 04/18/2020   Elevated lactic acid level 04/18/2020   Nausea vomiting and diarrhea 04/18/2020   Diabetes mellitus without complication (HCC)    Hypertension    Polymyositis (HCC)    Pulmonary fibrosis (HCC)    Acute posthemorrhagic anemia    Gastritis    Second degree hemorrhoids    Diverticulosis of large intestine without diverticulitis    Acute hepatitis    Jaundice 05/18/2016    Past Surgical History:  Procedure Laterality Date   COLONOSCOPY WITH PROPOFOL N/A 05/25/2016   Procedure: COLONOSCOPY WITH  PROPOFOL;  Surgeon: Midge Minium, MD;  Location: [facility] ENDOSCOPY;  Service: Endoscopy;  Laterality: N/A;   ESOPHAGOGASTRODUODENOSCOPY (EGD) WITH PROPOFOL N/A 05/25/2016   Procedure: ESOPHAGOGASTRODUODENOSCOPY (EGD) WITH PROPOFOL;  Surgeon: Midge Minium, MD;  Location: [facility] ENDOSCOPY;  Service: Endoscopy;  Laterality: N/A;   EYE SURGERY     HERNIA REPAIR     TESTICLE REMOVAL      Prior to Admission medications   Medication Sig Start Date End Date Taking? Authorizing Provider  aspirin EC 81 MG tablet Take 81 mg by mouth daily.    Yes [provider]  Calcium Carbonate-Vitamin D 600-400 MG-UNIT tablet Take 1 tablet by mouth daily.    Yes [provider]  Cholecalciferol 25 MCG (1000 UT) tablet Take 1,000 Units by mouth daily.    Yes [provider]  esomeprazole (NEXIUM) 20 MG capsule Take 20 mg by mouth daily.    Yes [provider]  Flaxseed, Linseed, (FLAXSEED OIL) 1000 MG CAPS Take 1 capsule by mouth 2 (two) times daily.    Yes [provider]  glipiZIDE (GLUCOTROL XL) 10 MG 24 hr tablet Take 10 mg by mouth daily as needed. If sugar is higher > 150 05/18/16  Yes [provider]  magnesium oxide (MAG-OX) 400 MG tablet Take 0-800 mg by mouth See admin instructions. 450 mg in the morning and 350 in the evening   Yes [provider]  mirabegron ER (MYRBETRIQ) 50 MG TB24 tablet  Take 1 tablet (50 mg total) by mouth daily. 09/21/21  Yes McGowan, Carollee Herter A, PA-C  Omega-3 Fatty Acids (FISH OIL) 1000 MG CAPS Take 1 capsule by mouth 2 (two) times daily.   Yes [provider]  predniSONE (DELTASONE) 5 MG tablet Take 15 mg by mouth daily. 09/01/21  Yes [provider]  tamsulosin (FLOMAX) 0.4 MG CAPS capsule Take 0.4 mg by mouth daily after supper. 04/08/20  Yes [provider]  alendronate (FOSAMAX) 70 MG tablet Take 70 mg by mouth once a week. 04/14/20   [provider]  enalapril (VASOTEC) 5 MG tablet Take 1 tablet  (5 mg total) by mouth daily. 03/18/21 04/17/21  Darlin Drop, DO    Allergies Atorvastatin, Statins, Montelukast, Montelukast sodium, Other, Sulfa antibiotics, and Sulfamethoxazole-trimethoprim  Family History  Problem Relation Age of Onset   CAD Mother     Social History Social History   Tobacco Use   Smoking status: Former   Smokeless tobacco: Never  Building services engineer Use: Never used  Substance Use Topics   Alcohol use: No   Drug use: No    Review of Systems  Constitutional: No fever.  Positive for weakness. Eyes: No redness. ENT: No sore throat. Cardiovascular: Denies chest pain. Respiratory: Denies shortness of breath. Gastrointestinal: No vomiting or diarrhea.  Genitourinary: Negative for dysuria.  Musculoskeletal: Negative for back pain. Skin: Negative for rash. Neurological: Negative for headaches, focal weakness or numbness.   ____________________________________________   PHYSICAL EXAM:  VITAL SIGNS: ED Triage Vitals  Enc Vitals Group     BP 10/05/21 1312 94/67     Pulse Rate 10/05/21 1312 (!) 128     Resp 10/05/21 1312 (!) 22     Temp 10/05/21 1312 97.7 F (36.5 C)     Temp Source 10/05/21 1312 Oral     SpO2 10/05/21 1312 100 %     Weight 10/05/21 1315 165 lb (74.8 kg)     Height 10/05/21 1315 5\' 10"  (1.778 m)     Head Circumference --      Peak Flow --      Pain Score 10/05/21 1315 8     Pain Loc --      Pain Edu? --      Excl. in GC? --     Constitutional: Alert, oriented x2.  Somewhat weak appearing but in no acute distress. Eyes: Conjunctivae are normal.  EOMI.  PERRLA. Head: Atraumatic. Nose: No congestion/rhinnorhea. Mouth/Throat: Mucous membranes are dry. Neck: Normal range of motion.  Cardiovascular: Borderline tachycardic, regular rhythm. Grossly normal heart sounds.  Good peripheral circulation. Respiratory: Normal respiratory effort.  No retractions.  Breath sounds somewhat diminished bilaterally. Gastrointestinal: Soft and  nontender. No distention.  Genitourinary: No flank tenderness. Musculoskeletal: No lower extremity edema.  Extremities warm and well perfused.  Neurologic:  Normal speech and language.  Motor intact in all extremities. Skin:  Skin is warm and dry. No rash noted. Psychiatric: Calm and cooperative.  ____________________________________________   LABS (all labs ordered are listed, but only abnormal results are displayed)  Labs Reviewed  BASIC METABOLIC PANEL - Abnormal; Notable for the following components:      Result Value   Sodium 134 (*)    Chloride 90 (*)    Glucose, Bld 204 (*)    BUN 33 (*)    Creatinine, Ser 1.27 (*)    GFR, Estimated 57 (*)    All other components within normal limits  CBC -  Abnormal; Notable for the following components:   WBC 17.7 (*)    RBC 3.10 (*)    Hemoglobin 9.4 (*)    HCT 28.9 (*)    All other components within normal limits  URINALYSIS, COMPLETE (UACMP) WITH MICROSCOPIC - Abnormal; Notable for the following components:   Color, Urine YELLOW (*)    APPearance HAZY (*)    Protein, ur 30 (*)    All other components within normal limits  TROPONIN I (HIGH SENSITIVITY) - Abnormal; Notable for the following components:   Troponin I (High Sensitivity) 3,563 (*)    All other components within normal limits  RESP PANEL BY RT-PCR (FLU A&B, COVID) ARPGX2  CULTURE, BLOOD (ROUTINE X 2)  CULTURE, BLOOD (ROUTINE X 2)  LACTIC ACID, PLASMA  LACTIC ACID, PLASMA  PROTIME-INR  APTT  BRAIN NATRIURETIC PEPTIDE  HEPARIN LEVEL (UNFRACTIONATED)  CBG MONITORING, ED  TROPONIN I (HIGH SENSITIVITY)   ____________________________________________  EKG  ED ECG REPORT I, Dionne Bucy, the attending physician, personally viewed and interpreted this ECG.  Date: 10/05/2021 EKG Time: 1320 Rate: 111 Rhythm: Sinus tachycardia QRS Axis: normal Intervals: normal ST/T Wave abnormalities: LVH with repolarization abnormality Narrative Interpretation:  Nonspecific abnormalities with no evidence of acute ischemia  ____________________________________________  RADIOLOGY  CT head: IMPRESSION:  No acute intracranial abnormality seen.   Chest x-ray interpreted by me shows bilateral multifocal opacities  ____________________________________________   PROCEDURES  Procedure(s) performed: No  Procedures  Critical Care performed: Yes  CRITICAL CARE Performed by: Dionne Bucy   Total critical care time: 30 minutes  Critical care time was exclusive of separately billable procedures and treating other patients.  Critical care was necessary to treat or prevent imminent or life-threatening deterioration.  Critical care was time spent personally by me on the following activities: development of treatment plan with patient and/or surrogate as well as nursing, discussions with consultants, evaluation of patient's response to treatment, examination of patient, obtaining history from patient or surrogate, ordering and performing treatments and interventions, ordering and review of laboratory studies, ordering and review of radiographic studies, pulse oximetry and re-evaluation of patient's condition.  ____________________________________________   INITIAL IMPRESSION / ASSESSMENT AND PLAN / ED COURSE  Pertinent labs & imaging results that were available during my care of the patient were reviewed by me and considered in my medical decision making (see chart for details).   80 year old male with PMH as noted above presents with 2 episodes of near syncope over the last 2 days along with generalized weakness and a cough.  The wife noted a sound of gurgling in his chest but the patient himself denies shortness of breath.  On exam, the patient initially was hypotensive and tachycardic.  His other vital signs are normal.  He is at baseline on 4 L of O2 by nasal cannula.  He has diminished breath sounds bilaterally.  Neurologic exam is  nonfocal.  The rest of the exam is as described above.  Presentation is concerning for acute infection/sepsis, possible UTI or pulmonary source, versus dehydration or other metabolic etiology, or less likely cardiac cause.  I have a low suspicion for CNS cause and the neuro exam is nonfocal we will obtain a CT.  We will obtain lab work-up for sepsis and give fluids and reassess.  ----------------------------------------- 3:28 PM on 10/05/2021 -----------------------------------------  Chest x-ray shows bilateral opacities.  Elevated WBC count and vital signs favor pneumonia/sepsis.  I will initiate empiric antibiotics.  ----------------------------------------- 4:00 PM on 10/05/2021 -----------------------------------------  Troponin  is significantly elevated although the patient is not having any chest pain or shortness of breath.  He does not have any significant EKG changes.  I consulted Dr. Welton Flakes from cardiology who agrees with starting the patient on heparin.  Blood pressure is improving with fluids.  I signed the patient out to the oncoming ED physician Dr. Katrinka Blazing who will discuss with hospitalist for admission.   ____________________________________________   FINAL CLINICAL IMPRESSION(S) / ED DIAGNOSES  Final diagnoses:  NSTEMI (non-ST elevated myocardial infarction) (HCC)  Syncope, unspecified syncope type  SIRS (systemic inflammatory response syndrome) (HCC)      NEW MEDICATIONS STARTED DURING THIS VISIT:  New Prescriptions   No medications on file     Note:  This document was prepared using Dragon voice recognition software and may include unintentional dictation errors.        Dionne Bucy, MD 10/05/21 [phone]

## 2021-10-05 NOTE — ED Notes (Signed)
Pts wife states that pt does not have a hx of falls and does not remember falling. Pt states chronic pain in his left shoulder, not due to the fall. Pt's right FA wrapped up after suffering a skin tear from fall.

## 2021-10-05 NOTE — ED Notes (Signed)
Patient transported to CT 

## 2021-10-05 NOTE — ED Triage Notes (Signed)
Pt comes pov with wife with near syncope episodes at home and emesis this morning. Pt fell when he got out of bed this morning. Skin tear to right wrist wrapped by wife. 4L [place] chronically. Pt states throat pain and dry cough. Denies blood thinners.

## 2021-10-05 NOTE — Sepsis Progress Note (Signed)
Messaged Bedside RN about Pt's low BP. Midodrine and Albumin being given.

## 2021-10-05 NOTE — Progress Notes (Signed)
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     Montelukast     Other reaction(s): Other (See Comments)   Montelukast Sodium     Other reaction(s): Unknown   Other Diarrhea   Sulfa Antibiotics Diarrhea   Sulfamethoxazole-Trimethoprim Diarrhea    Patient Measurements: Height: 5\' 10"  (177.8 cm) Weight: 74.8 kg (165 lb) IBW/kg (Calculated) : 73 Heparin Dosing Weight: 74.8 kg  Vital Signs: Temp: 97.7 F (36.5 C) (10/12 1312) Temp Source: Oral (10/12 1312) BP: 103/61 (10/12 1400) Pulse Rate: 99 (10/12 1400)  Labs: Recent Labs    10/05/21 1323  HGB 9.4*  HCT 28.9*  PLT 222  CREATININE 1.27*    Estimated Creatinine Clearance: 48.7 mL/min (A) (by C-G formula based on SCr of 1.27 mg/dL (H)).   Medical History: Past Medical History:  Diagnosis Date   Diabetes mellitus without complication (HCC)    Hypertension    Polymyositis (HCC)    Pulmonary fibrosis (HCC)     Medications:  (Not in a hospital admission)  Scheduled:  Infusions:   azithromycin     cefTRIAXone (ROCEPHIN)  IV     lactated ringers     PRN:  Anti-infectives (From admission, onward)    Start     Dose/Rate Route Frequency Ordered Stop   10/05/21 1600  cefTRIAXone (ROCEPHIN) 2 g in sodium chloride 0.9 % 100 mL IVPB        2 g 200 mL/hr over 30 Minutes Intravenous Every 24 hours 10/05/21 1549 10/10/21 1559   10/05/21 1600  azithromycin (ZITHROMAX) 500 mg in sodium chloride 0.9 % 250 mL IVPB        500 mg 250 mL/hr over 60 Minutes Intravenous Every 24 hours 10/05/21 1549 10/10/21 1559       Assessment: 79YOM with sepsis and concern for ACS. Troponins elevated to >3,000. Hgb low 9.4, though is at  baseline (BL 7.6), Plts WNL. Bleeding not reported in notes.  Per chart review, patient is not on anticoagulation at home.  Goal of Therapy:  Heparin level 0.3-0.7 units/ml Monitor platelets by anticoagulation protocol: Yes   Plan:  Give 4,000 units bolus x 1 Start heparin infusion at 900 units/hr anti-Xa level in 8 hours. Baseline aPTT, INR. Daily CBC.   10/12/21, PharmD Pharmacy Resident  10/05/2021 3:52 PM

## 2021-10-05 NOTE — Consult Note (Signed)
Lee Johnson is a 80 y.o. male  [id number]  Primary Cardiologist: Welton Flakes MD, Wynelle Link  Reason for Consultation: elevated troponin  HPI: Lee Johnson is a 80 y.o. male with medical history significant for moderate aortic stenosis, diabetes mellitus, hypertension, polymyositis, interstitial lung disease with chronic respiratory failure on 4 L of oxygen continuous and hypertension who was brought to the ER by his wife for evaluation of 2 near syncopal episodes on the day of his admission.   Review of Systems: Patient denies chest pain. Shortness of breath unchanged from baseline.    Past Medical History:  Diagnosis Date   Diabetes mellitus without complication (HCC)    Hypertension    Polymyositis (HCC)    Pulmonary fibrosis (HCC)     (Not in a hospital admission)     heparin  4,000 Units Intravenous Once    Infusions:  azithromycin     cefTRIAXone (ROCEPHIN)  IV     heparin     lactated ringers      Allergies  Allergen Reactions   Atorvastatin     Other reaction(s): Muscle Pain Other reaction(s): Muscle Pain Other reaction(s): Muscle Pain Other reaction(s): Muscle Pain   Statins Other (See Comments)    Polymyositis. 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     Montelukast     Other reaction(s): Other (See Comments)   Montelukast Sodium     Other reaction(s): Unknown   Other Diarrhea   Sulfa Antibiotics Diarrhea   Sulfamethoxazole-Trimethoprim Diarrhea    Social History   Socioeconomic History   Marital status: Married    Spouse name: Not on file   Number of children: Not on file   Years of education: Not on file   Highest education level: Not on file  Occupational History   Not on file  Tobacco Use   Smoking status: Former   Smokeless tobacco: Never  Vaping Use   Vaping Use: Never used  Substance and Sexual Activity   Alcohol use: No   Drug use: No   Sexual activity: Never  Other Topics Concern   Not on file  Social History Narrative   Not on file    Social Determinants of Health   Financial Resource Strain: Not on file  Food Insecurity: Not on file  Transportation Needs: Not on file  Physical Activity: Not on file  Stress: Not on file  Social Connections: Not on file  Intimate Partner Violence: Not on file    Family History  Problem Relation Age of Onset   CAD Mother     PHYSICAL EXAM: Vitals:   10/05/21 1340 10/05/21 1400  BP: 94/73 103/61  Pulse: (!) 101 99  Resp: (!) 23 (!) 7  Temp:    SpO2: 100% 100%    No intake or output data in the 24 hours ending 10/05/21 1645  General:  Well appearing. No respiratory difficulty HEENT: normal Neck: supple. no JVD. Carotids 2+ bilat; no bruits. No lymphadenopathy or thryomegaly appreciated. Cor: PMI nondisplaced. Regular rate & rhythm. No rubs, gallops or murmurs. Lungs: clear Abdomen: soft, nontender, nondistended. No hepatosplenomegaly. No bruits or masses. Good bowel sounds. Extremities: no cyanosis, clubbing, rash, edema Neuro: alert & oriented x 3, cranial nerves grossly intact. moves all 4 extremities w/o difficulty. Affect pleasant.  ECG: Sinus tachycardia with PACs, HR 111, LVH  Results for orders placed or performed during the hospital encounter of 10/05/21 (from the past 24 hour(s))  Basic metabolic panel     Status:  Abnormal   Collection Time: 10/05/21  1:23 PM  Result Value Ref Range   Sodium 134 (L) 135 - 145 mmol/L   Potassium 4.9 3.5 - 5.1 mmol/L   Chloride 90 (L) 98 - 111 mmol/L   CO2 31 22 - 32 mmol/L   Glucose, Bld 204 (H) 70 - 99 mg/dL   BUN 33 (H) 8 - 23 mg/dL   Creatinine, Ser 6.64 (H) 0.61 - 1.24 mg/dL   Calcium 40.3 8.9 - 47.4 mg/dL   GFR, Estimated 57 (L) >60 mL/min   Anion gap 13 5 - 15  CBC     Status: Abnormal   Collection Time: 10/05/21  1:23 PM  Result Value Ref Range   WBC 17.7 (H) 4.0 - 10.5 K/uL   RBC 3.10 (L) 4.22 - 5.81 MIL/uL   Hemoglobin 9.4 (L) 13.0 - 17.0 g/dL   HCT 25.9 (L) 56.3 - 87.5 %   MCV 93.2 80.0 - 100.0 fL    MCH 30.3 26.0 - 34.0 pg   MCHC 32.5 30.0 - 36.0 g/dL   RDW 64.3 32.9 - 51.8 %   Platelets 222 150 - 400 K/uL   nRBC 0.0 0.0 - 0.2 %  Brain natriuretic peptide     Status: Abnormal   Collection Time: 10/05/21  1:23 PM  Result Value Ref Range   B Natriuretic Peptide 859.7 (H) 0.0 - 100.0 pg/mL  Urinalysis, Complete w Microscopic     Status: Abnormal   Collection Time: 10/05/21  2:38 PM  Result Value Ref Range   Color, Urine YELLOW (A) YELLOW   APPearance HAZY (A) CLEAR   Specific Gravity, Urine 1.024 1.005 - 1.030   pH 6.0 5.0 - 8.0   Glucose, UA NEGATIVE NEGATIVE mg/dL   Hgb urine dipstick NEGATIVE NEGATIVE   Bilirubin Urine NEGATIVE NEGATIVE   Ketones, ur NEGATIVE NEGATIVE mg/dL   Protein, ur 30 (A) NEGATIVE mg/dL   Nitrite NEGATIVE NEGATIVE   Leukocytes,Ua NEGATIVE NEGATIVE   RBC / HPF 0-5 0 - 5 RBC/hpf   WBC, UA 0-5 0 - 5 WBC/hpf   Bacteria, UA NONE SEEN NONE SEEN   Squamous Epithelial / LPF NONE SEEN 0 - 5   Mucus PRESENT    Hyaline Casts, UA PRESENT   Lactic acid, plasma     Status: None   Collection Time: 10/05/21  2:38 PM  Result Value Ref Range   Lactic Acid, Venous 1.8 0.5 - 1.9 mmol/L  Troponin I (High Sensitivity)     Status: Abnormal   Collection Time: 10/05/21  2:38 PM  Result Value Ref Range   Troponin I (High Sensitivity) 3,563 (HH) <18 ng/L  Lactic acid, plasma     Status: None   Collection Time: 10/05/21  3:54 PM  Result Value Ref Range   Lactic Acid, Venous 1.9 0.5 - 1.9 mmol/L  Protime-INR     Status: None   Collection Time: 10/05/21  3:54 PM  Result Value Ref Range   Prothrombin Time 13.0 11.4 - 15.2 seconds   INR 1.0 0.8 - 1.2  APTT     Status: None   Collection Time: 10/05/21  3:54 PM  Result Value Ref Range   aPTT 33 24 - 36 seconds   CT HEAD WO CONTRAST  Result Date: 10/05/2021 CLINICAL DATA:  Altered mental status. EXAM: CT HEAD WITHOUT CONTRAST TECHNIQUE: Contiguous axial images were obtained from the base of the skull through the vertex  without intravenous contrast. COMPARISON:  March 15, 2021.  FINDINGS: Brain: Mild chronic ischemic white matter disease is noted. No mass effect or midline shift is noted. Ventricular size is within normal limits. There is no evidence of mass lesion, hemorrhage or acute infarction. Vascular: No hyperdense vessel or unexpected calcification. Skull: Normal. Negative for fracture or focal lesion. Sinuses/Orbits: No acute finding. Other: None. IMPRESSION: No acute intracranial abnormality seen. Electronically Signed   By: Lupita Raider M.D.   On: 10/05/2021 15:22   DG Chest Portable 1 View  Result Date: 10/05/2021 CLINICAL DATA:  Possible sepsis. EXAM: PORTABLE CHEST 1 VIEW COMPARISON:  03/15/2021 FINDINGS: Stable cardiomediastinal contours. Chronic reticular interstitial opacities are again noted with a lower lung zone predominance compatible with pulmonary fibrosis. Since the previous exam there is been interval development of superimposed bilateral pulmonary opacities throughout both lungs which may represent superimposed pulmonary edema and/or multifocal pneumonia. Visualized osseous structures are unremarkable. IMPRESSION: 1. Interval development of bilateral pulmonary opacities which may represent superimposed pulmonary edema and/or multifocal pneumonia. 2. Changes of chronic interstitial lung disease/pulmonary fibrosis. Electronically Signed   By: Signa Kell M.D.   On: 10/05/2021 15:15     ASSESSMENT AND PLAN: Patient denies chest pain. Troponin elevated. Possible demand ischemia. Echo 07/06/21 at [facility], EF 54%. No acute changes on EKG. Will continue to follow.   Museum/gallery conservator FNP-C

## 2021-10-06 ENCOUNTER — Encounter: Payer: Self-pay | Admitting: Internal Medicine

## 2021-10-06 ENCOUNTER — Inpatient Hospital Stay
Admit: 2021-10-06 | Discharge: 2021-10-06 | Disposition: A | Discharge status: Home / Self Care | Payer: Medicare Other | Attending: Internal Medicine | Admitting: Internal Medicine

## 2021-10-06 DIAGNOSIS — Z515 Encounter for palliative care: Secondary | ICD-10-CM

## 2021-10-06 LAB — HEPARIN LEVEL (UNFRACTIONATED)
Heparin Unfractionated: 0.26 IU/mL — ABNORMAL LOW (ref 0.30–0.70)
Heparin Unfractionated: 0.3 IU/mL (ref 0.30–0.70)

## 2021-10-06 LAB — TROPONIN I (HIGH SENSITIVITY)
Troponin I (High Sensitivity): 5699 ng/L (ref <18)
Troponin I (High Sensitivity): 4362 ng/L (ref <18)
Troponin I (High Sensitivity): 4003 ng/L (ref <18)

## 2021-10-06 LAB — BASIC METABOLIC PANEL
Anion gap: 12 (ref 5–15)
BUN: 33 mg/dL — ABNORMAL HIGH (ref 8–23)
CO2: 27 mmol/L (ref 22–32)
Calcium: 8.8 mg/dL — ABNORMAL LOW (ref 8.9–10.3)
Chloride: 95 mmol/L — ABNORMAL LOW (ref 98–111)
Creatinine, Ser: 1.28 mg/dL — ABNORMAL HIGH (ref 0.61–1.24)
GFR, Estimated: 57 mL/min — ABNORMAL LOW (ref >60)
Glucose, Bld: 178 mg/dL — ABNORMAL HIGH (ref 70–99)
Potassium: 4.5 mmol/L (ref 3.5–5.1)
Sodium: 134 mmol/L — ABNORMAL LOW (ref 135–145)

## 2021-10-06 LAB — CBC
HCT: 27.6 % — ABNORMAL LOW (ref 39.0–52.0)
Hemoglobin: 8.9 g/dL — ABNORMAL LOW (ref 13.0–17.0)
MCH: 30.3 pg (ref 26.0–34.0)
MCHC: 32.2 g/dL (ref 30.0–36.0)
MCV: 93.9 fL (ref 80.0–100.0)
Platelets: 199 10*3/uL (ref 150–400)
RBC: 2.94 MIL/uL — ABNORMAL LOW (ref 4.22–5.81)
RDW: 14.2 % (ref 11.5–15.5)
WBC: 17.2 10*3/uL — ABNORMAL HIGH (ref 4.0–10.5)
nRBC: 0 % (ref 0.0–0.2)

## 2021-10-06 LAB — ECHOCARDIOGRAM COMPLETE
AR max vel: 0.65 cm2
AV Area VTI: 0.75 cm2
AV Area mean vel: 0.61 cm2
AV Mean grad: 13 mmHg
AV Peak grad: 22.4 mmHg
Ao pk vel: 2.37 m/s
Area-P 1/2: 6.37 cm2
Calc EF: 26.6 %
Height: 70 in
S' Lateral: 4.1 cm
Single Plane A2C EF: 17.9 %
Single Plane A4C EF: 35.9 %
Weight: 2640 oz

## 2021-10-06 LAB — CBG MONITORING, ED
Glucose-Capillary: 153 mg/dL — ABNORMAL HIGH (ref 70–99)
Glucose-Capillary: 191 mg/dL — ABNORMAL HIGH (ref 70–99)

## 2021-10-06 LAB — STREP PNEUMONIAE URINARY ANTIGEN: Strep Pneumo Urinary Antigen: NEGATIVE

## 2021-10-06 LAB — PROCALCITONIN: Procalcitonin: 0.1 ng/mL

## 2021-10-06 LAB — HEMOGLOBIN A1C
Hgb A1c MFr Bld: 6.5 % — ABNORMAL HIGH (ref 4.8–5.6)
Mean Plasma Glucose: 140 mg/dL

## 2021-10-06 LAB — GLUCOSE, CAPILLARY: Glucose-Capillary: 267 mg/dL — ABNORMAL HIGH (ref 70–99)

## 2021-10-06 LAB — MAGNESIUM: Magnesium: 1.9 mg/dL (ref 1.7–2.4)

## 2021-10-06 MED ORDER — METOPROLOL TARTRATE 25 MG PO TABS
12.5000 mg | ORAL_TABLET | Freq: Two times a day (BID) | ORAL | Status: DC
  Administered 2021-10-06: 12.5 mg via ORAL

## 2021-10-06 MED ORDER — LORAZEPAM 2 MG/ML IJ SOLN: 0.5000 mg | INTRAMUSCULAR | Status: DC | PRN

## 2021-10-06 MED ORDER — CLOPIDOGREL BISULFATE 75 MG PO TABS: 75.0000 mg | ORAL_TABLET | Freq: Every day | ORAL | Status: DC

## 2021-10-06 MED ORDER — MORPHINE 100MG IN NS 100ML (1MG/ML) PREMIX INFUSION
1.0000 mg/h | INTRAVENOUS | Status: DC
  Administered 2021-10-06: 1 mg/h via INTRAVENOUS
  Filled 2021-10-06 (×2): qty 100

## 2021-10-06 MED ORDER — HEPARIN BOLUS VIA INFUSION
1100.0000 [IU] | Freq: Once | INTRAVENOUS | Status: AC
  Administered 2021-10-06: 1100 [IU] via INTRAVENOUS
  Filled 2021-10-06: qty 1100

## 2021-10-06 NOTE — Progress Notes (Signed)
   10/06/21 1700  Clinical Encounter Type  Visited With Patient  Visit Type Initial;Spiritual support  Referral From Nurse  Consult/Referral To Chaplain  Spiritual Encounters  Spiritual Needs Prayer;Emotional  Chaplain Brystal Kildow responded to a RR in room 232, Pt Lee Johnson. I provided a ministry of presence, emotional and spiritual support. Pt was stable at the current time and there were no family members present.

## 2021-10-06 NOTE — Progress Notes (Signed)
Patient arrived to floor from ED, transferred in bed per ED RN Jonny Ruiz. While assisting with getting bed in room, noticed patient was diaphoretic, labored breathing, grayish skin tone, and overall not looking well. Rapid response called due to patients condition. Initial blood pressure was 60/30, oxygen sats were low, blood sugar >250. Rapid response team arrived. Non-rebreather applied, started a 250 bolus, held the lasix drip that was running to help establish a better blood pressure. Initiated patient on bipap. Started to transfer patient to ICU and at that time, patient family decided to make the patient DNR and comfort care measures. Dr. Myriam Forehand at bedside and talked family through comfort care measures, family agreed. All prior orders discontinued, bipap removed, will monitor patient and follow comfort measures.

## 2021-10-06 NOTE — Progress Notes (Signed)
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     Montelukast     Other reaction(s): Other (See Comments)   Montelukast Sodium     Other reaction(s): Unknown   Other Diarrhea   Sulfa Antibiotics Diarrhea   Sulfamethoxazole-Trimethoprim Diarrhea    Patient Measurements: Height: 5\' 10"  (177.8 cm) Weight: 74.8 kg (165 lb) IBW/kg (Calculated) : 73 Heparin Dosing Weight: 74.8 kg  Vital Signs: Temp: 97.8 F (36.6 C) (10/13 0252) Temp Source: Oral (10/13 0252) BP: 98/40 (10/13 0900) Pulse Rate: 118 (10/13 0900)  Labs: Recent Labs    10/05/21 1323 10/05/21 1438 10/05/21 1554 10/05/21 1632 10/06/21 0100 10/06/21 0445 10/06/21 0707 10/06/21 0954  HGB 9.4*  --   --   --   --  8.9*  --   --   HCT 28.9*  --   --   --   --  27.6*  --   --   PLT 222  --   --   --   --  199  --   --   APTT  --   --  33  --   --   --   --   --   LABPROT  --   --  13.0  --   --   --   --   --   INR  --   --  1.0  --   --   --   --   --   HEPARINUNFRC  --   --   --   --  0.26*  --   --  0.30  CREATININE 1.27*  --   --   --   --  1.28*  --   --   TROPONINIHS  --    < >  --    < > 5,699* 4,362* 4,003*  --    < > = values in this interval not displayed.     Estimated Creatinine Clearance: 47.5 mL/min (A) (by C-G formula based on SCr of 1.28 mg/dL (H)).   Medical History: Past Medical History:  Diagnosis Date   Diabetes mellitus without complication (HCC)    Hypertension    Polymyositis (HCC)    Pulmonary fibrosis (HCC)     Medications:  (Not in a hospital admission) Scheduled:   aspirin EC  81 mg Oral Daily   aspirin EC  81 mg Oral Daily   calcium-vitamin D  1 tablet Oral Q breakfast    cholecalciferol  1,000 Units Oral Daily   hydrocortisone sod succinate (SOLU-CORTEF) inj  100 mg Intravenous Q8H   insulin aspart  0-15 Units Subcutaneous TID WC   magnesium oxide  400 mg Oral BID   mirabegron ER  50 mg Oral Daily   omega-3 acid ethyl esters  1 g Oral BID   pantoprazole  40 mg Oral Daily   tamsulosin  0.4 mg Oral QPC supper   Infusions:   albumin human 25 g (10/06/21 1009)   azithromycin Stopped (10/05/21 1901)   cefTRIAXone (ROCEPHIN)  IV Stopped (10/05/21 1758)   furosemide (LASIX) 200 mg in dextrose 5% 100 mL (2mg /mL) infusion 8 mg/hr (10/06/21 1015)   heparin 1,050 Units/hr (10/06/21 1016)  PRN:  Anti-infectives (From admission, onward)    Start     Dose/Rate Route Frequency Ordered Stop   10/05/21 1600  cefTRIAXone (ROCEPHIN) 2 g in sodium chloride 0.9 % 100 mL IVPB        2 g 200 mL/hr over 30 Minutes Intravenous Every 24 hours 10/05/21 1549 10/10/21 1559   10/05/21 1600  azithromycin (ZITHROMAX) 500 mg in sodium chloride 0.9 % 250 mL IVPB        500 mg 250 mL/hr over 60 Minutes Intravenous Every 24 hours 10/05/21 1549 10/10/21 1559       Assessment: 79YOM with PMH of moderate stenosis, DM, HTN, polymyositis, interstitial lung disease with chronic respiratory failure on 4 L of oxygen who was admitted with sepsis and concern for ACS. Pharmacy was consulted for heparin dosing.   Per chart review, patient is not on anticoagulation at home.  Date/time HL  Comment, rate 10/13 0100 0.26  Subthera, 1100 bolus, 900>1050units/hr 10/13 0954 0.30  Thera, x1  Goal of Therapy:  Heparin level 0.3-0.7 units/ml Monitor platelets by anticoagulation protocol: Yes   Plan:  -Heparin therapeutic, will continue current rate of 1050 units/hr -Will recheck HL 8 hrs to confirm therapeutic  -Daily CBC while on heparin ggt    Doloris Hall, PharmD Pharmacy Resident  10/06/2021 10:44 AM

## 2021-10-06 NOTE — Plan of Care (Signed)
  Problem: Pain Managment: Goal: General experience of comfort will improve Outcome: Progressing   Problem: Safety: Goal: Ability to remain free from injury will improve Outcome: Progressing   

## 2021-10-06 NOTE — Progress Notes (Addendum)
Rapid response was called for patient because of acute respiratory distress.  Patient was tachypneic and in respiratory distress.  He was ashen.  He was also hypotensive.  Oxygen saturation was 96% on 4 L/min oxygen.  2D echo showed EF estimated at 35% to 40%.  Impression: Acute on chronic hypoxemic respiratory failure and acute  systolic CHF from acute NSTEMI.  BiPAP was ordered with plan to transfer to stepdown unit for management.  Patient was placed on BiPAP briefly.  However, patient's wife and daughter, Lee Johnson, requested comfort measures so BiPAP was discontinued.  They declined any invasive procedures including left heart catheterization.  They understand that prognosis is poor and he is at increased risk for cardiopulmonary arrest and death without treatment. They requested that all medications be discontinued and wanted [place] to focus on comfort and pain control.  IV antibiotics, IV heparin, IV Lasix have been discontinued. He has been transitioned to comfort measures and has been started on IV morphine drip. Plan was discussed with patient, his wife and daughter at the bedside. Lee Johnson, patient's nurse, respiratory therapist, unit charge nurse, nurse assistant, rapid response team and nursing supervisor were all at the bedside during this encounter.

## 2021-10-06 NOTE — Progress Notes (Signed)
*  PRELIMINARY RESULTS* Echocardiogram 2D Echocardiogram has been performed.  Cristela Blue 10/06/2021, 12:50 PM

## 2021-10-06 NOTE — Progress Notes (Signed)
Rapid Response Event Note   Reason for Call : Respiratory distress   Initial Focused Assessment: Pt drowsy. Pt very clammy. CBG WNL. Lasix gtt, antibiotic, and heparin gtt infusing. Pt on nonrebreather. Family at bedside. Pt is bradycardic and initially hypotensive (after recheck, BP stable).   Interventions: Lasix gtt paused d/t hypotension initially. 250 LR bolus started. Recheck BP stable. Bipap placed by RT. Family at bedside discussing code status with MD. Pt is verified to be a DNR and wants no intervention.   Plan of Care: After MD talked with family, pt is a DNR and will be transitioned to comfort care.   Event Summary:   MD Notified: Dr. Myriam Forehand Call Time: 1612 Arrival Time: 1615 End Time: 1640  Henrene Dodge, RN

## 2021-10-06 NOTE — Progress Notes (Signed)
[facility] Encounter Note  Patient: Lee Johnson / Admit Date: 10/05/2021 / Date of Encounter: 10/06/2021, 11:33 AM   Subjective: Patient is more conversant today than yesterday and feels slightly better.  There was some mild amount of chest discomfort.  This has resolved.  Patient has had lower blood pressure and currently has been unable to add beta-blocker or nitrates for his non-ST elevation myocardial infarction.  He does have an EKG suggesting some evolution of an inferior myocardial infarction.  Peak troponin has been 4362 yesterday.  Patient did have significant chest x-ray changes consistent with fibrosis and possible infection as well as possible pulmonary edema.  Patient was placed on Lasix for treatment of pulmonary edema with improved hypoxia today but overall has had positive fluid balance.  Historically the patient has had moderate aortic stenosis with no evidence of significant changes recently by echocardiogram.  In addition he has had severe pulmonary hypertension and pulmonary fibrosis with no evidence of significant improvements recently.  He received a cardiac catheterization in the past but did not have coronary artery injections.  CT scan previously shows severe calcification suggesting that he has significant cardiovascular disease as listed above.  Due to the significant concerns by the family and inability to do further intervention, we have discussed the possibility of medical management.  If medical management fails and/or the patient has significant continued symptoms then would consider the possibility of cardiac catheterization at that stage  Review of Systems: Positive for: Shortness of breath Negative for: Vision change, hearing change, syncope, dizziness, nausea, vomiting,diarrhea, bloody stool, stomach pain, cough, congestion, diaphoresis, urinary frequency, urinary pain,skin lesions, skin rashes Others previously  listed  Objective: Telemetry: Sinus tachycardia Physical Exam: Blood pressure (!) 103/27, pulse (!) 113, temperature 97.8 F (36.6 C), temperature source Oral, resp. rate (!) 29, height 5\' 10"  (1.778 m), weight 74.8 kg, SpO2 100 %. Body mass index is 23.68 kg/m. General: Well developed, well nourished, in no acute distress. Head: Normocephalic, atraumatic, sclera non-icteric, no xanthomas, nares are without discharge. Neck: No apparent masses Lungs: Normal respirations with diffuse wheezes, some rhonchi, no rales , basilar crackles   Heart: Irregular rate and rhythm, normal S1 S2, 3+ aortic murmur, no rub, no gallop, PMI is normal size and placement, carotid upstroke normal without bruit, jugular venous pressure normal Abdomen: Soft, non-tender, non-distended with normoactive bowel sounds. No hepatosplenomegaly. Abdominal aorta is normal size without bruit Extremities: Trace edema, no clubbing, no cyanosis, no ulcers,  Peripheral: 2+ radial, 2+ femoral, 2+ dorsal pedal pulses Neuro: Alert and oriented. Moves all extremities spontaneously. Psych:  Responds to questions appropriately with a normal affect.   Intake/Output Summary (Last 24 hours) at 10/06/2021 1133 Last data filed at 10/06/2021 1115 Gross per 24 hour  Intake 689.44 ml  Output --  Net 689.44 ml    Inpatient Medications:   aspirin EC  81 mg Oral Daily   aspirin EC  81 mg Oral Daily   calcium-vitamin D  1 tablet Oral Q breakfast   cholecalciferol  1,000 Units Oral Daily   hydrocortisone sod succinate (SOLU-CORTEF) inj  100 mg Intravenous Q8H   insulin aspart  0-15 Units Subcutaneous TID WC   magnesium oxide  400 mg Oral BID   mirabegron ER  50 mg Oral Daily   omega-3 acid ethyl esters  1 g Oral BID   pantoprazole  40 mg Oral Daily   tamsulosin  0.4 mg Oral QPC supper   Infusions:   albumin  human Stopped (10/06/21 1115)   azithromycin Stopped (10/05/21 1901)   cefTRIAXone (ROCEPHIN)  IV Stopped (10/05/21 1758)    furosemide (LASIX) 200 mg in dextrose 5% 100 mL (2mg /mL) infusion 8 mg/hr (10/06/21 1015)   heparin 1,050 Units/hr (10/06/21 1016)    Labs: Recent Labs    10/05/21 1323 10/05/21 1938 10/06/21 0445  NA 134*  --  134*  K 4.9  --  4.5  CL 90*  --  95*  CO2 31  --  27  GLUCOSE 204*  --  178*  BUN 33*  --  33*  CREATININE 1.27*  --  1.28*  CALCIUM 10.1  --  8.8*  MG  --  1.9  --    Recent Labs    10/05/21 1945  AST 66*  ALT 61*  ALKPHOS 38  BILITOT 0.4  PROT 6.0*  ALBUMIN 2.7*   Recent Labs    10/05/21 1323 10/06/21 0445  WBC 17.7* 17.2*  HGB 9.4* 8.9*  HCT 28.9* 27.6*  MCV 93.2 93.9  PLT 222 199   No results for input(s): CKTOTAL, CKMB, TROPONINI in the last 72 hours. Invalid input(s): POCBNP No results for input(s): HGBA1C in the last 72 hours.   Weights: Filed Weights   10/05/21 1315  Weight: 74.8 kg     Radiology/Studies:  CT HEAD WO CONTRAST  Result Date: 10/05/2021 CLINICAL DATA:  Altered mental status. EXAM: CT HEAD WITHOUT CONTRAST TECHNIQUE: Contiguous axial images were obtained from the base of the skull through the vertex without intravenous contrast. COMPARISON:  March 15, 2021. FINDINGS: Brain: Mild chronic ischemic white matter disease is noted. No mass effect or midline shift is noted. Ventricular size is within normal limits. There is no evidence of mass lesion, hemorrhage or acute infarction. Vascular: No hyperdense vessel or unexpected calcification. Skull: Normal. Negative for fracture or focal lesion. Sinuses/Orbits: No acute finding. Other: None. IMPRESSION: No acute intracranial abnormality seen. Electronically Signed   By: March 17, 2021 M.D.   On: 10/05/2021 15:22   CT CHEST WO CONTRAST  Result Date: 10/05/2021 CLINICAL DATA:  Concern for infection EXAM: CT CHEST WITHOUT CONTRAST TECHNIQUE: Multidetector CT imaging of the chest was performed following the standard protocol without IV contrast. COMPARISON:  None. FINDINGS: Cardiovascular:  Cardiomegaly with no pericardial effusion. Three-vessel coronary artery calcifications. Atherosclerotic disease of the thoracic aorta. Mediastinum/Nodes: Esophagus and thyroid are unremarkable. Prominent subcentimeter mediastinal lymph nodes which are likely reactive. Reference right upper paratracheal lymph node measuring 9 mm in short axis on series 2, image 61. Lungs/Pleura: Central airways are patent. Background bilateral lower lung predominant reticular opacities with honeycomb change. Diffuse bilateral ground-glass opacities which are slightly more pronounced in the right upper lobe. Smooth interlobular septal thickening and small bilateral pleural effusions. Upper Abdomen: No acute abnormality. Musculoskeletal: No chest wall mass or suspicious bone lesions identified. IMPRESSION: Bilateral ground-glass opacities which are likely acute and superimposed on chronic interstitial lung disease, favor pulmonary edema given presence of smooth interlobular septal thickening and small bilateral pleural effusions, additional considerations include infection or acute exacerbation of ILD. Small bilateral pleural effusions. Aortic Atherosclerosis (ICD10-I70.0). Electronically Signed   By: 12/05/2021 M.D.   On: 10/05/2021 17:25   DG Chest Portable 1 View  Result Date: 10/05/2021 CLINICAL DATA:  Possible sepsis. EXAM: PORTABLE CHEST 1 VIEW COMPARISON:  03/15/2021 FINDINGS: Stable cardiomediastinal contours. Chronic reticular interstitial opacities are again noted with a lower lung zone predominance compatible with pulmonary fibrosis. Since the previous exam there  is been interval development of superimposed bilateral pulmonary opacities throughout both lungs which may represent superimposed pulmonary edema and/or multifocal pneumonia. Visualized osseous structures are unremarkable. IMPRESSION: 1. Interval development of bilateral pulmonary opacities which may represent superimposed pulmonary edema and/or  multifocal pneumonia. 2. Changes of chronic interstitial lung disease/pulmonary fibrosis. Electronically Signed   By: Signa Kell M.D.   On: 10/05/2021 15:15     Assessment and Recommendation  80 y.o. male with known severe pulmonary hypertension and pulmonary fibrosis with hypoxia having episode of presyncope syncope with no significant major injury and non-ST elevation myocardial infarction with EKG suggesting inferior infarct with peak troponin of 4362 and mild congestive heart failure and hypotension. 1.  Continue heparin for minimum of 48 hours due to non-ST elevation myocardial infarction 2.  Single antiplatelet therapy with Plavix for myocardial infarction's due to concerns of anemia with a hemoglobin of 9.4 3.  No further cardiac intervention or diagnostics at this time with stable moderate aortic valve stenosis and concerns for inability to perform further intervention.  Discussion with medical management with patient and family and they wish to pursue medical management and only cardiac catheterization if patient fails and needs more treatment options 4.  Continue supportive care for possible pulmonary infection and continue pulmonary fibrosis with hypoxia 5.  Echocardiogram for further evaluation of LV systolic dysfunction valvular heart disease and adjustments of medication management as per above  Signed, Arnoldo Hooker M.D. FACC

## 2021-10-06 NOTE — Progress Notes (Signed)
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     Montelukast     Other reaction(s): Other (See Comments)   Montelukast Sodium     Other reaction(s): Unknown   Other Diarrhea   Sulfa Antibiotics Diarrhea   Sulfamethoxazole-Trimethoprim Diarrhea    Patient Measurements: Height: 5\' 10"  (177.8 cm) Weight: 74.8 kg (165 lb) IBW/kg (Calculated) : 73 Heparin Dosing Weight: 74.8 kg  Vital Signs: Temp: 99.4 F (37.4 C) (10/12 1649) Temp Source: Oral (10/12 1649) BP: 100/65 (10/13 0100) Pulse Rate: 87 (10/12 2330)  Labs: Recent Labs    10/05/21 1323 10/05/21 1438 10/05/21 1554 10/05/21 1632 10/06/21 0100  HGB 9.4*  --   --   --   --   HCT 28.9*  --   --   --   --   PLT 222  --   --   --   --   APTT  --   --  33  --   --   LABPROT  --   --  13.0  --   --   INR  --   --  1.0  --   --   HEPARINUNFRC  --   --   --   --  0.26*  CREATININE 1.27*  --   --   --   --   TROPONINIHS  --  3,563*  --  4,534*  --      Estimated Creatinine Clearance: 47.9 mL/min (A) (by C-G formula based on SCr of 1.27 mg/dL (H)).   Medical History: Past Medical History:  Diagnosis Date   Diabetes mellitus without complication (HCC)    Hypertension    Polymyositis (HCC)    Pulmonary fibrosis (HCC)     Medications:  (Not in a hospital admission) Scheduled:   aspirin EC  81 mg Oral Daily   aspirin EC  81 mg Oral Daily   calcium-vitamin D  1 tablet Oral Q breakfast   cholecalciferol  1,000 Units Oral Daily   heparin  1,100 Units Intravenous Once   hydrocortisone sod succinate (SOLU-CORTEF) inj  100 mg Intravenous Q8H   insulin aspart  0-15 Units Subcutaneous TID WC   magnesium oxide  400 mg Oral  BID   mirabegron ER  50 mg Oral Daily   omega-3 acid ethyl esters  1 g Oral BID   pantoprazole  40 mg Oral Daily   tamsulosin  0.4 mg Oral QPC supper   Infusions:   albumin human Stopped (10/06/21 0020)   azithromycin Stopped (10/05/21 1901)   cefTRIAXone (ROCEPHIN)  IV Stopped (10/05/21 1758)   furosemide (LASIX) 200 mg in dextrose 5% 100 mL (2mg /mL) infusion 8 mg/hr (10/06/21 0102)   heparin 900 Units/hr (10/05/21 1648)   PRN:  Anti-infectives (From admission, onward)    Start     Dose/Rate Route Frequency Ordered Stop   10/05/21 1600  cefTRIAXone (ROCEPHIN) 2 g in sodium chloride 0.9 % 100 mL IVPB        2 g 200 mL/hr over 30 Minutes Intravenous Every 24 hours 10/05/21 1549 10/10/21 1559   10/05/21 1600  azithromycin (ZITHROMAX) 500 mg in sodium chloride 0.9 % 250 mL IVPB  500 mg 250 mL/hr over 60 Minutes Intravenous Every 24 hours 10/05/21 1549 10/10/21 1559       Assessment: 79YOM with sepsis and concern for ACS. Troponins elevated to >3,000. Hgb low 9.4, though is at baseline (BL 7.6), Plts WNL. Bleeding not reported in notes.  Per chart review, patient is not on anticoagulation at home.  Goal of Therapy:  Heparin level 0.3-0.7 units/ml Monitor platelets by anticoagulation protocol: Yes   Plan:  10/13:  HL @ 0100 = 0.26 Will order heparin 1100 units IV X 1 and increase drip rate to 1050 units/hr.  Will recheck HL 8 hrs after rate change.    Thurley Francesconi D, PharmD 10/06/2021 1:41 AM

## 2021-10-06 NOTE — Progress Notes (Addendum)
Progress Note    Lee Johnson  QBH:[id number] DOB: May 03, 1941  DOA: 10/05/2021 PCP: Dione Housekeeper, MD      Brief Narrative:    Medical records reviewed and are as summarized below:  Lee Johnson is a 80 y.o. male with medical history significant for moderate aortic stenosis, diabetes mellitus, hypertension, polymyositis, interstitial lung disease, chronic hypoxic respiratory failure on 4 L/min oxygen at home, wheelchair-bound at baseline.  He presented to the hospital because of cough, shortness of breath, generalized weakness and near syncope.  Reportedly, he tried to get out of bed but he was passed out and fell.  He had another near syncopal episode when his wife tried to take him to the bathroom.    He was found to have acute NSTEMI, acute on chronic diastolic CHF and suspected pneumonia.  Peak troponin was 5,699.  He was treated with IV Lasix, IV heparin infusion and empiric IV antibiotics.     Assessment/Plan:   Principal Problem:   NSTEMI (non-ST elevated myocardial infarction) [facility]) Active Problems:   Generalized weakness   Fall   Diabetes mellitus without complication (HCC)   Polymyositis (HCC)   Pulmonary fibrosis (HCC)   Chronic respiratory failure (HCC)   Pulmonary edema   Sepsis (HCC)    Body mass index is 23.68 kg/m.  Acute NSTEMI: Continue IV heparin infusion and monitor heparin level per protocol.  Single antiplatelet therapy with Plavix as recommended by cardiologist. Patient is not on statin because of history of polymyositis.  Acute on chronic diastolic CHF, moderate aortic stenosis: Continue IV Lasix infusion but decrease dose from 8 mg/h to 4 mg/h.  2D echo is pending.  Monitor BMP, daily weight and urine output.  ?AKI: Monitor creatinine while on IV Lasix drip.  Hypotension: BP is better.  Discontinue IV albumin.  Monitor BP closely.  Probable pneumonia: It is difficult to rule in or rule out pneumonia at this time based on  chest x-ray and CT chest findings.  Continue empiric IV antibiotics for now.  Follow-up blood cultures.  Polymyositis, interstitial lung disease, chronic hypoxemic respiratory failure: He was taking prednisone 15 mg daily at home.  This has been replaced with stress dose of IV hydrocortisone.  Continue 4 L/min oxygen via nasal cannula.  Other comorbidities include type II DM, hypertension.  Use NovoLog as needed for hyperglycemia.     ADDENDUM  Rapid response was called (around 4:20 PM) for patient because of acute respiratory distress.  Patient was tachypneic and in respiratory distress.  He was ashen.  He was also hypotensive.  Oxygen saturation was 96% on 4 L/min oxygen.  2D echo showed EF estimated at 35% to 40%.   Impression: Acute on chronic hypoxemic respiratory failure and acute  systolic CHF from acute NSTEMI.   BiPAP was ordered with plan to transfer to stepdown unit for management.  Patient was placed on BiPAP briefly.  However, patient's wife and daughter, Lawanna Kobus, requested comfort measures so BiPAP was discontinued.  They declined any invasive procedures including left heart catheterization.  They understand that prognosis is poor and he is at increased risk for cardiopulmonary arrest and death without treatment. They requested that all medications be discontinued and wanted [place] to focus on comfort and pain control.   IV antibiotics, IV heparin, IV Lasix have been discontinued. He has been transitioned to comfort measures and has been started on IV morphine drip. Plan was discussed with patient, his wife and daughter at the  bedside. Gardiner Barefoot, patient's nurse, respiratory therapist, unit charge nurse, nurse assistant, rapid response team and nursing supervisor were all at the bedside during this encounter.     CRITICAL CARE Performed by: Lurene Shadow   Total critical care time: 45 minutes  Critical care time was exclusive of separately billable procedures and treating  other patients.  Critical care was necessary to treat or prevent imminent or life-threatening deterioration.  Critical care was time spent personally by me on the following activities: development of treatment plan with patient and/or surrogate as well as nursing, discussions with consultants, evaluation of patient's response to treatment, examination of patient, obtaining history from patient or surrogate, ordering and performing treatments and interventions, ordering and review of laboratory studies, ordering and review of radiographic studies, pulse oximetry and re-evaluation of patient's condition.    Diet Order             Diet Carb Modified Fluid consistency: Thin; Room service appropriate? Yes  Diet effective now                      Consultants: Cardiologist  Procedures: None    Medications:    calcium-vitamin D  1 tablet Oral Q breakfast   cholecalciferol  1,000 Units Oral Daily   clopidogrel  75 mg Oral Daily   hydrocortisone sod succinate (SOLU-CORTEF) inj  100 mg Intravenous Q8H   insulin aspart  0-15 Units Subcutaneous TID WC   magnesium oxide  400 mg Oral BID   metoprolol tartrate  12.5 mg Oral BID   mirabegron ER  50 mg Oral Daily   omega-3 acid ethyl esters  1 g Oral BID   pantoprazole  40 mg Oral Daily   tamsulosin  0.4 mg Oral QPC supper   Continuous Infusions:  azithromycin Stopped (10/05/21 1901)   cefTRIAXone (ROCEPHIN)  IV Stopped (10/05/21 1758)   furosemide (LASIX) 200 mg in dextrose 5% 100 mL (2mg /mL) infusion 8 mg/hr (10/06/21 1015)   heparin 1,050 Units/hr (10/06/21 1016)     Anti-infectives (From admission, onward)    Start     Dose/Rate Route Frequency Ordered Stop   10/05/21 1600  cefTRIAXone (ROCEPHIN) 2 g in sodium chloride 0.9 % 100 mL IVPB        2 g 200 mL/hr over 30 Minutes Intravenous Every 24 hours 10/05/21 1549 10/10/21 1559   10/05/21 1600  azithromycin (ZITHROMAX) 500 mg in sodium chloride 0.9 % 250 mL IVPB         500 mg 250 mL/hr over 60 Minutes Intravenous Every 24 hours 10/05/21 1549 10/10/21 1559              Family Communication/Anticipated D/C date and plan/Code Status   DVT prophylaxis:      Code Status: DNR  Family Communication: Plan discussed with his wife at the bedside Disposition Plan:    Status is: Inpatient  Remains inpatient appropriate because:IV treatments appropriate due to intensity of illness or inability to take PO and Inpatient level of care appropriate due to severity of illness  Dispo: The patient is from: Home              Anticipated d/c is to: Home              Patient currently is not medically stable to d/c.   Difficult to place patient No           Subjective:   Interval events noted.  He had chest pain earlier  this morning but this has resolved.  He still has a cough.  No shortness of breath.  Objective:    Vitals:   10/06/21 0900 10/06/21 1030 10/06/21 1048 10/06/21 1157  BP: (!) 98/40 (!) 103/27  105/71  Pulse: (!) 118 84 (!) 113 (!) 111  Resp: (!) 23 (!) 24 (!) 29 19  Temp:    98.8 F (37.1 C)  TempSrc:    Axillary  SpO2: 95% (!) 89% 100% 100%  Weight:      Height:       No data found.   Intake/Output Summary (Last 24 hours) at 10/06/2021 1231 Last data filed at 10/06/2021 1115 Gross per 24 hour  Intake 689.44 ml  Output --  Net 689.44 ml   Filed Weights   10/05/21 1315  Weight: 74.8 kg    Exam:  GEN: NAD SKIN: Warm and dry EYES: No pallor or icterus ENT: MMM CV: RRR PULM: B/l wheezing, mild bibasilar rales ABD: soft, ND, NT, +BS, +umbilical hernia CNS: AAO x 3, non focal EXT: No edema or tenderness       Data Reviewed:   I have personally reviewed following labs and imaging studies:  Labs: Labs show the following:   Basic Metabolic Panel: Recent Labs  Lab 10/05/21 1323 10/05/21 1938 10/06/21 0445  NA 134*  --  134*  K 4.9  --  4.5  CL 90*  --  95*  CO2 31  --  27  GLUCOSE 204*  --   178*  BUN 33*  --  33*  CREATININE 1.27*  --  1.28*  CALCIUM 10.1  --  8.8*  MG  --  1.9  --    GFR Estimated Creatinine Clearance: 47.5 mL/min (A) (by C-G formula based on SCr of 1.28 mg/dL (H)). Liver Function Tests: Recent Labs  Lab 10/05/21 1945  AST 66*  ALT 61*  ALKPHOS 38  BILITOT 0.4  PROT 6.0*  ALBUMIN 2.7*   No results for input(s): LIPASE, AMYLASE in the last 168 hours. No results for input(s): AMMONIA in the last 168 hours. Coagulation profile Recent Labs  Lab 10/05/21 1554  INR 1.0    CBC: Recent Labs  Lab 10/05/21 1323 10/06/21 0445  WBC 17.7* 17.2*  HGB 9.4* 8.9*  HCT 28.9* 27.6*  MCV 93.2 93.9  PLT 222 199   Cardiac Enzymes: No results for input(s): CKTOTAL, CKMB, CKMBINDEX, TROPONINI in the last 168 hours. BNP (last 3 results) No results for input(s): PROBNP in the last 8760 hours. CBG: Recent Labs  Lab 10/06/21 0719 10/06/21 1135  GLUCAP 153* 191*   D-Dimer: No results for input(s): DDIMER in the last 72 hours. Hgb A1c: No results for input(s): HGBA1C in the last 72 hours. Lipid Profile: No results for input(s): CHOL, HDL, LDLCALC, TRIG, CHOLHDL, LDLDIRECT in the last 72 hours. Thyroid function studies: No results for input(s): TSH, T4TOTAL, T3FREE, THYROIDAB in the last 72 hours.  Invalid input(s): FREET3 Anemia work up: No results for input(s): VITAMINB12, FOLATE, FERRITIN, TIBC, IRON, RETICCTPCT in the last 72 hours. Sepsis Labs: Recent Labs  Lab 10/05/21 1323 10/05/21 1438 10/05/21 1554 10/05/21 1938 10/06/21 0445  PROCALCITON  --   --   --  0.11 0.10  WBC 17.7*  --   --   --  17.2*  LATICACIDVEN  --  1.8 1.9  --   --     Microbiology Recent Results (from the past 240 hour(s))  Resp Panel by RT-PCR (Flu A&B,  Covid) Nasopharyngeal Swab     Status: None   Collection Time: 10/05/21  3:54 PM   Specimen: Nasopharyngeal Swab; Nasopharyngeal(NP) swabs in vial transport medium  Result Value Ref Range Status   SARS  Coronavirus 2 by RT PCR NEGATIVE NEGATIVE Final    Comment: (NOTE) SARS-CoV-2 target nucleic acids are NOT DETECTED.  The SARS-CoV-2 RNA is generally detectable in upper respiratory specimens during the acute phase of infection. The lowest concentration of SARS-CoV-2 viral copies this assay can detect is 138 copies/mL. A negative result does not preclude SARS-Cov-2 infection and should not be used as the sole basis for treatment or other patient management decisions. A negative result may occur with  improper specimen collection/handling, submission of specimen other than nasopharyngeal swab, presence of viral mutation(s) within the areas targeted by this assay, and inadequate number of viral copies(<138 copies/mL). A negative result must be combined with clinical observations, patient history, and epidemiological information. The expected result is Negative.  Fact Sheet for Patients:  BloggerCourse.com  Fact Sheet for Healthcare Providers:  SeriousBroker.it  This test is no t yet approved or cleared by the [place] FDA and  has been authorized for detection and/or diagnosis of SARS-CoV-2 by FDA under an Emergency Use Authorization (EUA). This EUA will remain  in effect (meaning this test can be used) for the duration of the COVID-19 declaration under Section 564(b)(1) of the Act, 21 U.S.C.section 360bbb-3(b)(1), unless the authorization is terminated  or revoked sooner.       Influenza A by PCR NEGATIVE NEGATIVE Final   Influenza B by PCR NEGATIVE NEGATIVE Final    Comment: (NOTE) The Xpert Xpress SARS-CoV-2/FLU/RSV plus assay is intended as an aid in the diagnosis of influenza from Nasopharyngeal swab specimens and should not be used as a sole basis for treatment. Nasal washings and aspirates are unacceptable for Xpert Xpress SARS-CoV-2/FLU/RSV testing.  Fact Sheet for  Patients: BloggerCourse.com  Fact Sheet for Healthcare Providers: SeriousBroker.it  This test is not yet approved or cleared by the [place] FDA and has been authorized for detection and/or diagnosis of SARS-CoV-2 by FDA under an Emergency Use Authorization (EUA). This EUA will remain in effect (meaning this test can be used) for the duration of the COVID-19 declaration under Section 564(b)(1) of the Act, 21 U.S.C. section 360bbb-3(b)(1), unless the authorization is terminated or revoked.  Performed at [facility], [address] [place], [place] [address]   Blood culture (routine x 2)     Status: None (Preliminary result)   Collection Time: 10/05/21  5:28 PM   Specimen: BLOOD  Result Value Ref Range Status   Specimen Description BLOOD BLOOD RIGHT HAND  Final   Special Requests   Final    BOTTLES DRAWN AEROBIC AND ANAEROBIC Blood Culture results may not be optimal due to an inadequate volume of blood received in culture bottles   Culture   Final    NO GROWTH < 12 HOURS Performed at [facility], [address], [place], [place] [address]    Report Status PENDING  Incomplete    Procedures and diagnostic studies:  CT HEAD WO CONTRAST  Result Date: 10/05/2021 CLINICAL DATA:  Altered mental status. EXAM: CT HEAD WITHOUT CONTRAST TECHNIQUE: Contiguous axial images were obtained from the base of the skull through the vertex without intravenous contrast. COMPARISON:  March 15, 2021. FINDINGS: Brain: Mild chronic ischemic white matter disease is noted. No mass effect or midline shift is noted. Ventricular size is within  normal limits. There is no evidence of mass lesion, hemorrhage or acute infarction. Vascular: No hyperdense vessel or unexpected calcification. Skull: Normal. Negative for fracture or focal lesion. Sinuses/Orbits: No acute finding. Other: None. IMPRESSION: No acute intracranial abnormality seen.  Electronically Signed   By: Lupita Raider M.D.   On: 10/05/2021 15:22   CT CHEST WO CONTRAST  Result Date: 10/05/2021 CLINICAL DATA:  Concern for infection EXAM: CT CHEST WITHOUT CONTRAST TECHNIQUE: Multidetector CT imaging of the chest was performed following the standard protocol without IV contrast. COMPARISON:  None. FINDINGS: Cardiovascular: Cardiomegaly with no pericardial effusion. Three-vessel coronary artery calcifications. Atherosclerotic disease of the thoracic aorta. Mediastinum/Nodes: Esophagus and thyroid are unremarkable. Prominent subcentimeter mediastinal lymph nodes which are likely reactive. Reference right upper paratracheal lymph node measuring 9 mm in short axis on series 2, image 61. Lungs/Pleura: Central airways are patent. Background bilateral lower lung predominant reticular opacities with honeycomb change. Diffuse bilateral ground-glass opacities which are slightly more pronounced in the right upper lobe. Smooth interlobular septal thickening and small bilateral pleural effusions. Upper Abdomen: No acute abnormality. Musculoskeletal: No chest wall mass or suspicious bone lesions identified. IMPRESSION: Bilateral ground-glass opacities which are likely acute and superimposed on chronic interstitial lung disease, favor pulmonary edema given presence of smooth interlobular septal thickening and small bilateral pleural effusions, additional considerations include infection or acute exacerbation of ILD. Small bilateral pleural effusions. Aortic Atherosclerosis (ICD10-I70.0). Electronically Signed   By: Allegra Lai M.D.   On: 10/05/2021 17:25   DG Chest Portable 1 View  Result Date: 10/05/2021 CLINICAL DATA:  Possible sepsis. EXAM: PORTABLE CHEST 1 VIEW COMPARISON:  03/15/2021 FINDINGS: Stable cardiomediastinal contours. Chronic reticular interstitial opacities are again noted with a lower lung zone predominance compatible with pulmonary fibrosis. Since the previous exam there  is been interval development of superimposed bilateral pulmonary opacities throughout both lungs which may represent superimposed pulmonary edema and/or multifocal pneumonia. Visualized osseous structures are unremarkable. IMPRESSION: 1. Interval development of bilateral pulmonary opacities which may represent superimposed pulmonary edema and/or multifocal pneumonia. 2. Changes of chronic interstitial lung disease/pulmonary fibrosis. Electronically Signed   By: Signa Kell M.D.   On: 10/05/2021 15:15               LOS: 1 day   Itzabella Sorrels  [facility]   Pager on [email or web address]. If 7PM-7AM, please contact night-coverage at [email or web address]     10/06/2021, 12:31 PM

## 2021-10-06 NOTE — ED Notes (Signed)
Pt transitioned over into hospital bed for comfort as well as need to reposition for ease of breathing. Pts external catheter replaced with new in place and suction back in place. Spouse at bedside with no request at this time.

## 2021-10-06 NOTE — Progress Notes (Signed)
   10/06/21 2003  Clinical Encounter Type  Visited With Patient and family together  Visit Type Follow-up  Referral From Nurse  Consult/Referral To Chaplain  Spiritual Encounters  Spiritual Needs Emotional  Chaplain Oleta Mouse responded to an OR for end of Life, room 232A, Mr. Lee Johnson. Pt alert and often smiled at times. I told Lee Johnson Happy [place] Ouch and he smiled. Pt wife and 3 adult children are at the pt's beside. I provided reflective listening, words of encouragement and told the family I will check on [name] again before the end of my shift.

## 2021-10-07 DIAGNOSIS — Z515 Encounter for palliative care: Secondary | ICD-10-CM

## 2021-10-07 DIAGNOSIS — I5021 Acute systolic (congestive) heart failure: Secondary | ICD-10-CM

## 2021-10-07 LAB — LEGIONELLA PNEUMOPHILA SEROGP 1 UR AG: L. pneumophila Serogp 1 Ur Ag: NEGATIVE

## 2021-10-07 MED ORDER — MORPHINE SULFATE 20 MG/5ML PO SOLN: 5.0000 mg | ORAL | 0 refills | Status: DC | PRN

## 2021-10-07 NOTE — TOC CM/SW Note (Addendum)
Plan for DC home with [facility] pending hospital bed delivery. EMS paperwork has been completed. [facility] Representative Boneta Lucks to call for EMS transport when ready.   5:10- DC delayed to tomorrow.  Alfonso Ramus, [place] [phone]

## 2021-10-07 NOTE — Progress Notes (Signed)
[facility] Cardiology    SUBJECTIVE: The patient has no complaints or pain at this time. He had some chest pain yesterday, but denies any at present. He denies feeling short of breath at this time on supplemental oxygen.    Vitals:   10/06/21 1408 10/06/21 1700 10/06/21 1750 10/07/21 0429  BP: 108/74 (!) 85/66  112/89  Pulse: 100 88  (!) 106  Resp: 18 20  (!) 40  Temp:  98.6 F (37 C)  98.3 F (36.8 C)  TempSrc:  Axillary  Oral  SpO2: 96% 98% 94% 96%  Weight:      Height:         Intake/Output Summary (Last 24 hours) at 10/07/2021 6503 Last data filed at 10/07/2021 0600 Gross per 24 hour  Intake 810.94 ml  Output --  Net 810.94 ml      PHYSICAL EXAM  General: Frail, elderly gentleman lying in bed at an incline, in no acute distress HEENT:  Normocephalic and atramatic Neck:  No JVD.  Lungs: bibasilar crackles, no wheezing, normal effort of breathing on nasal cannula Heart: regular rhythm, tachycardic, 2/6 systolic murmur Abdomen: no obvious distention Extremities: No clubbing, cyanosis or edema.   Neuro: Alert and oriented X 3. Psych:  flat affect, responds appropriately Skin: pale, warm, dry   LABS: Basic Metabolic Panel: Recent Labs    10/05/21 1323 10/05/21 1938 10/06/21 0445  NA 134*  --  134*  K 4.9  --  4.5  CL 90*  --  95*  CO2 31  --  27  GLUCOSE 204*  --  178*  BUN 33*  --  33*  CREATININE 1.27*  --  1.28*  CALCIUM 10.1  --  8.8*  MG  --  1.9  --    Liver Function Tests: Recent Labs    10/05/21 1945  AST 66*  ALT 61*  ALKPHOS 38  BILITOT 0.4  PROT 6.0*  ALBUMIN 2.7*   No results for input(s): LIPASE, AMYLASE in the last 72 hours. CBC: Recent Labs    10/05/21 1323 10/06/21 0445  WBC 17.7* 17.2*  HGB 9.4* 8.9*  HCT 28.9* 27.6*  MCV 93.2 93.9  PLT 222 199   Cardiac Enzymes: No results for input(s): CKTOTAL, CKMB, CKMBINDEX, TROPONINI in the last 72 hours. BNP: Invalid input(s): POCBNP D-Dimer: No results for input(s): DDIMER in  the last 72 hours. Hemoglobin A1C: Recent Labs    10/05/21 1721  HGBA1C 6.5*   Fasting Lipid Panel: No results for input(s): CHOL, HDL, LDLCALC, TRIG, CHOLHDL, LDLDIRECT in the last 72 hours. Thyroid Function Tests: No results for input(s): TSH, T4TOTAL, T3FREE, THYROIDAB in the last 72 hours.  Invalid input(s): FREET3 Anemia Panel: No results for input(s): VITAMINB12, FOLATE, FERRITIN, TIBC, IRON, RETICCTPCT in the last 72 hours.  CT HEAD WO CONTRAST  Result Date: 10/05/2021 CLINICAL DATA:  Altered mental status. EXAM: CT HEAD WITHOUT CONTRAST TECHNIQUE: Contiguous axial images were obtained from the base of the skull through the vertex without intravenous contrast. COMPARISON:  March 15, 2021. FINDINGS: Brain: Mild chronic ischemic white matter disease is noted. No mass effect or midline shift is noted. Ventricular size is within normal limits. There is no evidence of mass lesion, hemorrhage or acute infarction. Vascular: No hyperdense vessel or unexpected calcification. Skull: Normal. Negative for fracture or focal lesion. Sinuses/Orbits: No acute finding. Other: None. IMPRESSION: No acute intracranial abnormality seen. Electronically Signed   By: Lupita Raider M.D.   On: 10/05/2021 15:22  CT CHEST WO CONTRAST  Result Date: 10/05/2021 CLINICAL DATA:  Concern for infection EXAM: CT CHEST WITHOUT CONTRAST TECHNIQUE: Multidetector CT imaging of the chest was performed following the standard protocol without IV contrast. COMPARISON:  None. FINDINGS: Cardiovascular: Cardiomegaly with no pericardial effusion. Three-vessel coronary artery calcifications. Atherosclerotic disease of the thoracic aorta. Mediastinum/Nodes: Esophagus and thyroid are unremarkable. Prominent subcentimeter mediastinal lymph nodes which are likely reactive. Reference right upper paratracheal lymph node measuring 9 mm in short axis on series 2, image 61. Lungs/Pleura: Central airways are patent. Background bilateral  lower lung predominant reticular opacities with honeycomb change. Diffuse bilateral ground-glass opacities which are slightly more pronounced in the right upper lobe. Smooth interlobular septal thickening and small bilateral pleural effusions. Upper Abdomen: No acute abnormality. Musculoskeletal: No chest wall mass or suspicious bone lesions identified. IMPRESSION: Bilateral ground-glass opacities which are likely acute and superimposed on chronic interstitial lung disease, favor pulmonary edema given presence of smooth interlobular septal thickening and small bilateral pleural effusions, additional considerations include infection or acute exacerbation of ILD. Small bilateral pleural effusions. Aortic Atherosclerosis (ICD10-I70.0). Electronically Signed   By: Allegra Lai M.D.   On: 10/05/2021 17:25   DG Chest Portable 1 View  Result Date: 10/05/2021 CLINICAL DATA:  Possible sepsis. EXAM: PORTABLE CHEST 1 VIEW COMPARISON:  03/15/2021 FINDINGS: Stable cardiomediastinal contours. Chronic reticular interstitial opacities are again noted with a lower lung zone predominance compatible with pulmonary fibrosis. Since the previous exam there is been interval development of superimposed bilateral pulmonary opacities throughout both lungs which may represent superimposed pulmonary edema and/or multifocal pneumonia. Visualized osseous structures are unremarkable. IMPRESSION: 1. Interval development of bilateral pulmonary opacities which may represent superimposed pulmonary edema and/or multifocal pneumonia. 2. Changes of chronic interstitial lung disease/pulmonary fibrosis. Electronically Signed   By: Signa Kell M.D.   On: 10/05/2021 15:15   ECHOCARDIOGRAM COMPLETE  Result Date: 10/06/2021    ECHOCARDIOGRAM REPORT   Patient Name:   Lee Johnson Date of Exam: 10/06/2021 Medical Rec #:  [id number]        Height:       70.0 in Accession #:    [id number]       Weight:       165.0 lb Date of Birth:  25-Feb-1941        BSA:          1.923 m Patient Age:    80 years         BP:           105/71 mmHg Patient Gender: M                HR:           111 bpm. Exam Location:  [facility] Procedure: 2D Echo, Cardiac Doppler and Color Doppler Indications:     NSTEMI I21.4  History:         Patient has no prior history of Echocardiogram examinations.                  Risk Factors:Hypertension and Diabetes. Pulmonary fibrosis.  Sonographer:     Cristela Blue Referring Phys:  [id number] AGBATA Diagnosing Phys: Marcina Millard MD  Sonographer Comments: Suboptimal apical window. IMPRESSIONS  1. Left ventricular ejection fraction, by estimation, is 35 to 40%. The left ventricle has moderately decreased function. The left ventricle has no regional wall motion abnormalities. Left ventricular diastolic parameters were normal.  2. Right ventricular systolic function is normal. The right ventricular size is  normal.  3. The mitral valve is normal in structure. Moderate mitral valve regurgitation. No evidence of mitral stenosis.  4. The aortic valve is normal in structure. Aortic valve regurgitation is not visualized. Mild to moderate aortic valve stenosis.  5. The inferior vena cava is normal in size with greater than 50% respiratory variability, suggesting right atrial pressure of 3 mmHg. FINDINGS  Left Ventricle: Left ventricular ejection fraction, by estimation, is 35 to 40%. The left ventricle has moderately decreased function. The left ventricle has no regional wall motion abnormalities. The left ventricular internal cavity size was normal in size. There is no left ventricular hypertrophy. Left ventricular diastolic parameters were normal. Right Ventricle: The right ventricular size is normal. No increase in right ventricular wall thickness. Right ventricular systolic function is normal. Left Atrium: Left atrial size was normal in size. Right Atrium: Right atrial size was normal in size. Pericardium: There is no evidence of pericardial  effusion. Mitral Valve: The mitral valve is normal in structure. Moderate mitral valve regurgitation. No evidence of mitral valve stenosis. Tricuspid Valve: The tricuspid valve is normal in structure. Tricuspid valve regurgitation is mild . No evidence of tricuspid stenosis. Aortic Valve: The aortic valve is normal in structure. Aortic valve regurgitation is not visualized. Mild to moderate aortic stenosis is present. Aortic valve mean gradient measures 13.0 mmHg. Aortic valve peak gradient measures 22.4 mmHg. Aortic valve area, by VTI measures 0.75 cm. Pulmonic Valve: The pulmonic valve was normal in structure. Pulmonic valve regurgitation is not visualized. No evidence of pulmonic stenosis. Aorta: The aortic root is normal in size and structure. Venous: The inferior vena cava is normal in size with greater than 50% respiratory variability, suggesting right atrial pressure of 3 mmHg. IAS/Shunts: No atrial level shunt detected by color flow Doppler.  LEFT VENTRICLE PLAX 2D LVIDd:         4.20 cm     Diastology LVIDs:         4.10 cm     LV e' medial:    4.68 cm/s LV PW:         1.00 cm     LV E/e' medial:  20.0 LV IVS:        0.85 cm     LV e' lateral:   13.50 cm/s LVOT diam:     2.00 cm     LV E/e' lateral: 6.9 LV SV:         28 LV SV Index:   15 LVOT Area:     3.14 cm  LV Volumes (MOD) LV vol d, MOD A2C: 64.9 ml LV vol d, MOD A4C: 99.4 ml LV vol s, MOD A2C: 53.3 ml LV vol s, MOD A4C: 63.7 ml LV SV MOD A2C:     11.6 ml LV SV MOD A4C:     99.4 ml LV SV MOD BP:      21.4 ml RIGHT VENTRICLE RV Basal diam:  4.30 cm RV S prime:     8.81 cm/s TAPSE (M-mode): 3.7 cm LEFT ATRIUM             Index        RIGHT ATRIUM           Index LA diam:        3.80 cm 1.98 cm/m   RA Area:     21.30 cm LA Vol (A2C):   55.3 ml 28.75 ml/m  RA Volume:   60.70 ml  31.56 ml/m LA Vol (A4C):  62.3 ml 32.39 ml/m LA Biplane Vol: 59.4 ml 30.88 ml/m  AORTIC VALVE                     PULMONIC VALVE AV Area (Vmax):    0.65 cm      PV Vmax:         0.58 m/s AV Area (Vmean):   0.61 cm      PV Peak grad:   1.3 mmHg AV Area (VTI):     0.75 cm      RVOT Peak grad: 2 mmHg AV Vmax:           236.67 cm/s AV Vmean:          164.000 cm/s AV VTI:            0.379 m AV Peak Grad:      22.4 mmHg AV Mean Grad:      13.0 mmHg LVOT Vmax:         49.00 cm/s LVOT Vmean:        32.100 cm/s LVOT VTI:          0.090 m LVOT/AV VTI ratio: 0.24  AORTA Ao Root diam: 3.10 cm MITRAL VALVE               TRICUSPID VALVE MV Area (PHT): 6.37 cm    TR Peak grad:   20.8 mmHg MV Decel Time: 119 msec    TR Vmax:        228.00 cm/s MV E velocity: 93.40 cm/s MV A velocity: 57.40 cm/s  SHUNTS MV E/A ratio:  1.63        Systemic VTI:  0.09 m                            Systemic Diam: 2.00 cm Marcina Millard MD Electronically signed by Marcina Millard MD Signature Date/Time: 10/06/2021/1:34:57 PM    Final      Echo LVEF 35-40%, moderate MR, mild to moderate AS    ASSESSMENT AND PLAN:  Principal Problem:   NSTEMI (non-ST elevated myocardial infarction) [facility]) Active Problems:   Generalized weakness   Fall   Diabetes mellitus without complication (HCC)   Polymyositis (HCC)   Pulmonary fibrosis (HCC)   Chronic respiratory failure (HCC)   Pulmonary edema   Sepsis (HCC)   End of life care    1. NSTEMI, presenting with chest pain, troponin peaking at 5699, and reduced left ventricular function (LVEF 35-40%) compared to previous (54%). Patient and family wish to pursue comfort care; IV antibiotics, IV heparin, and IV lasix have been discontinued, and IV morphine was started.    Recommendations: Per patient and family request, continue with comfort care at this time. No further cardiac diagnostics at this time.     Sign off for now; please [place] with any questions.   Leanora Ivanoff, PA-C 10/07/2021 8:52 AM

## 2021-10-07 NOTE — Progress Notes (Addendum)
Patient is taking IV morphine. Currently on comfort care. Will be discharged home with hospice. Family at patient's bedside at this moment.

## 2021-10-07 NOTE — Progress Notes (Signed)
DME supplies awaiting to be delivered/Wife uneasy about late d/c/per MD ok to stay tonight and discharge tomorrow. Will continue to monitor.

## 2021-10-07 NOTE — Progress Notes (Addendum)
Pt is comfort care but still have an active order for cardiac monitoring. MD Myriam Forehand made aware. Will continue to monitor.  Update 2017: MD [place] to discontinue order for heart monitor. Incoming shift made aware. Will continue to monitor.  Update 2050: Staff RN went to pt room to removed heart monitor but pt wife requested to keep it at this time. Incoming shift made aware. MD Myriam Forehand made aware. Will continue to monitor.

## 2021-10-07 NOTE — Progress Notes (Addendum)
Progress Note    Sheila Gervasi  NWG:[id number] DOB: 07/24/1941  DOA: 10/05/2021 PCP: Dione Housekeeper, MD      Brief Narrative:    Medical records reviewed and are as summarized below:  Deronte Solis is a 80 y.o. male with medical history significant for moderate aortic stenosis, diabetes mellitus, hypertension, polymyositis, interstitial lung disease, chronic hypoxic respiratory failure on 4 L/min oxygen at home, wheelchair-bound at baseline.  He presented to the hospital because of cough, shortness of breath, generalized weakness and near syncope.  Reportedly, he tried to get out of bed but he was passed out and fell.  He had another near syncopal episode when his wife tried to take him to the bathroom.    He was found to have acute NSTEMI, acute systolic and acute on chronic diastolic CHF and suspected pneumonia.  Sepsis was ruled out.  Peak troponin was 5,699.  He was treated with IV Lasix, IV heparin infusion and empiric IV antibiotics.  He developed acute respiratory distress associated with use of accessory respiratory muscles.  He was briefly placed on BiPAP.  Patient and his family opted for comfort measures only and requested that all medications be discontinued.  Hospice team was consulted and patient was deemed to be appropriate for comfort measures with hospice at home.     Assessment/Plan:   Principal Problem:   NSTEMI (non-ST elevated myocardial infarction) [facility]) Active Problems:   Generalized weakness   Fall   Diabetes mellitus without complication (HCC)   Polymyositis (HCC)   Pulmonary fibrosis (HCC)   Chronic respiratory failure (HCC)   Pulmonary edema   End of life care   Acute systolic CHF (congestive heart failure) (HCC)    Body mass index is 23.68 kg/m.  Acute NSTEMI Acute systolic CHF, acute on chronic diastolic CHF (EF 35 to 40%) Moderate aortic stenosis AKI Hypotension Probable pneumonia Polymyositis Interstitial lung  disease Acute on chronic  hypoxemic respiratory failure on 4 L/min oxygen at baseline Type 2 diabetes mellitus History of hypertension   PLAN  Continue comfort measures. Continue IV morphine drip. Hospice team was consulted today to assist with disposition. Patient has family preferred to be discharged home with hospice. Patient and his family want hospital bed to be delivered to his house before he is discharged. Plan of care was discussed with the patient's, his wife, 2 daughters Harriett Sine and [place]) and son, who are at the bedside.      Diet Order             Diet general                      Consultants: Cardiologist  Procedures: None    Medications:     Continuous Infusions:  morphine 1 mg/hr (10/06/21 1730)     Anti-infectives (From admission, onward)    Start     Dose/Rate Route Frequency Ordered Stop   10/05/21 1600  cefTRIAXone (ROCEPHIN) 2 g in sodium chloride 0.9 % 100 mL IVPB  Status:  Discontinued        2 g 200 mL/hr over 30 Minutes Intravenous Every 24 hours 10/05/21 1549 10/06/21 1636   10/05/21 1600  azithromycin (ZITHROMAX) 500 mg in sodium chloride 0.9 % 250 mL IVPB  Status:  Discontinued        500 mg 250 mL/hr over 60 Minutes Intravenous Every 24 hours 10/05/21 1549 10/06/21 1636  Family Communication/Anticipated D/C date and plan/Code Status   DVT prophylaxis:      Code Status: DNR  Family Communication: Plan discussed with his family at the bedside Disposition Plan:    Status is: Inpatient  Remains inpatient appropriate because:IV treatments appropriate due to intensity of illness or inability to take PO and Inpatient level of care appropriate due to severity of illness  Dispo: The patient is from: Home              Anticipated d/c is to: Home              Patient currently is medically stable to d/c.   Difficult to place patient No           Subjective:   Interval events noted.  He  had chest pain earlier this morning but this has resolved.  He still has a cough.  No shortness of breath.  Objective:    Vitals:   10/06/21 1750 10/07/21 0429 10/07/21 0930 10/07/21 1600  BP:  112/89  111/75  Pulse:  (!) 106  100  Resp:  (!) 40  16  Temp:  98.3 F (36.8 C)  98.1 F (36.7 C)  TempSrc:  Oral  Axillary  SpO2: 94% 96% 96%   Weight:      Height:       No data found.   Intake/Output Summary (Last 24 hours) at 10/07/2021 1652 Last data filed at 10/07/2021 1600 Gross per 24 hour  Intake 470.27 ml  Output --  Net 470.27 ml   Filed Weights   10/05/21 1315  Weight: 74.8 kg    Exam:  GEN: NAD SKIN: Warm and dry EYES: No pallor or icterus ENT: MMM CV: RRR PULM: B/l wheezing, mild bibasilar rales ABD: soft, ND, NT, +BS, +umbilical hernia CNS: AAO x 3, non focal EXT: No edema or tenderness       Data Reviewed:   I have personally reviewed following labs and imaging studies:  Labs: Labs show the following:   Basic Metabolic Panel: Recent Labs  Lab 10/05/21 1323 10/05/21 1938 10/06/21 0445  NA 134*  --  134*  K 4.9  --  4.5  CL 90*  --  95*  CO2 31  --  27  GLUCOSE 204*  --  178*  BUN 33*  --  33*  CREATININE 1.27*  --  1.28*  CALCIUM 10.1  --  8.8*  MG  --  1.9  --    GFR Estimated Creatinine Clearance: 47.5 mL/min (A) (by C-G formula based on SCr of 1.28 mg/dL (H)). Liver Function Tests: Recent Labs  Lab 10/05/21 1945  AST 66*  ALT 61*  ALKPHOS 38  BILITOT 0.4  PROT 6.0*  ALBUMIN 2.7*   No results for input(s): LIPASE, AMYLASE in the last 168 hours. No results for input(s): AMMONIA in the last 168 hours. Coagulation profile Recent Labs  Lab 10/05/21 1554  INR 1.0    CBC: Recent Labs  Lab 10/05/21 1323 10/06/21 0445  WBC 17.7* 17.2*  HGB 9.4* 8.9*  HCT 28.9* 27.6*  MCV 93.2 93.9  PLT 222 199   Cardiac Enzymes: No results for input(s): CKTOTAL, CKMB, CKMBINDEX, TROPONINI in the last 168 hours. BNP (last 3  results) No results for input(s): PROBNP in the last 8760 hours. CBG: Recent Labs  Lab 10/06/21 0719 10/06/21 1135 10/06/21 1614  GLUCAP 153* 191* 267*   D-Dimer: No results for input(s): DDIMER in the last 72 hours.  Hgb A1c: Recent Labs    10/05/21 1721  HGBA1C 6.5*   Lipid Profile: No results for input(s): CHOL, HDL, LDLCALC, TRIG, CHOLHDL, LDLDIRECT in the last 72 hours. Thyroid function studies: No results for input(s): TSH, T4TOTAL, T3FREE, THYROIDAB in the last 72 hours.  Invalid input(s): FREET3 Anemia work up: No results for input(s): VITAMINB12, FOLATE, FERRITIN, TIBC, IRON, RETICCTPCT in the last 72 hours. Sepsis Labs: Recent Labs  Lab 10/05/21 1323 10/05/21 1438 10/05/21 1554 10/05/21 1938 10/06/21 0445  PROCALCITON  --   --   --  0.11 0.10  WBC 17.7*  --   --   --  17.2*  LATICACIDVEN  --  1.8 1.9  --   --     Microbiology Recent Results (from the past 240 hour(s))  Resp Panel by RT-PCR (Flu A&B, Covid) Nasopharyngeal Swab     Status: None   Collection Time: 10/05/21  3:54 PM   Specimen: Nasopharyngeal Swab; Nasopharyngeal(NP) swabs in vial transport medium  Result Value Ref Range Status   SARS Coronavirus 2 by RT PCR NEGATIVE NEGATIVE Final    Comment: (NOTE) SARS-CoV-2 target nucleic acids are NOT DETECTED.  The SARS-CoV-2 RNA is generally detectable in upper respiratory specimens during the acute phase of infection. The lowest concentration of SARS-CoV-2 viral copies this assay can detect is 138 copies/mL. A negative result does not preclude SARS-Cov-2 infection and should not be used as the sole basis for treatment or other patient management decisions. A negative result may occur with  improper specimen collection/handling, submission of specimen other than nasopharyngeal swab, presence of viral mutation(s) within the areas targeted by this assay, and inadequate number of viral copies(<138 copies/mL). A negative result must be combined  with clinical observations, patient history, and epidemiological information. The expected result is Negative.  Fact Sheet for Patients:  BloggerCourse.com  Fact Sheet for Healthcare Providers:  SeriousBroker.it  This test is no t yet approved or cleared by the [place] FDA and  has been authorized for detection and/or diagnosis of SARS-CoV-2 by FDA under an Emergency Use Authorization (EUA). This EUA will remain  in effect (meaning this test can be used) for the duration of the COVID-19 declaration under Section 564(b)(1) of the Act, 21 U.S.C.section 360bbb-3(b)(1), unless the authorization is terminated  or revoked sooner.       Influenza A by PCR NEGATIVE NEGATIVE Final   Influenza B by PCR NEGATIVE NEGATIVE Final    Comment: (NOTE) The Xpert Xpress SARS-CoV-2/FLU/RSV plus assay is intended as an aid in the diagnosis of influenza from Nasopharyngeal swab specimens and should not be used as a sole basis for treatment. Nasal washings and aspirates are unacceptable for Xpert Xpress SARS-CoV-2/FLU/RSV testing.  Fact Sheet for Patients: BloggerCourse.com  Fact Sheet for Healthcare Providers: SeriousBroker.it  This test is not yet approved or cleared by the [place] FDA and has been authorized for detection and/or diagnosis of SARS-CoV-2 by FDA under an Emergency Use Authorization (EUA). This EUA will remain in effect (meaning this test can be used) for the duration of the COVID-19 declaration under Section 564(b)(1) of the Act, 21 U.S.C. section 360bbb-3(b)(1), unless the authorization is terminated or revoked.  Performed at [facility], [address] [place], [place] [address]   Blood culture (routine x 2)     Status: None (Preliminary result)   Collection Time: 10/05/21  5:28 PM   Specimen: BLOOD  Result Value Ref Range Status   Specimen  Description BLOOD BLOOD RIGHT  HAND  Final   Special Requests   Final    BOTTLES DRAWN AEROBIC AND ANAEROBIC Blood Culture results may not be optimal due to an inadequate volume of blood received in culture bottles   Culture   Final    NO GROWTH 2 DAYS Performed at [facility], [address], [place], [place] [address]    Report Status PENDING  Incomplete  Culture, blood (Routine X 2) w Reflex to ID Panel     Status: None (Preliminary result)   Collection Time: 10/06/21  7:07 AM   Specimen: BLOOD RIGHT HAND  Result Value Ref Range Status   Specimen Description BLOOD RIGHT HAND  Final   Special Requests   Final    BOTTLES DRAWN AEROBIC AND ANAEROBIC Blood Culture adequate volume   Culture   Final    NO GROWTH < 24 HOURS Performed at [facility], [address] [place], [place] [address]    Report Status PENDING  Incomplete    Procedures and diagnostic studies:  ECHOCARDIOGRAM COMPLETE  Result Date: 10/06/2021    ECHOCARDIOGRAM REPORT   Patient Name:   KILIAN SCHWARTZ Date of Exam: 10/06/2021 Medical Rec #:  [id number]        Height:       70.0 in Accession #:    [id number]       Weight:       165.0 lb Date of Birth:  1941/12/24       BSA:          1.923 m Patient Age:    80 years         BP:           105/71 mmHg Patient Gender: M                HR:           111 bpm. Exam Location:  [facility] Procedure: 2D Echo, Cardiac Doppler and Color Doppler Indications:     NSTEMI I21.4  History:         Patient has no prior history of Echocardiogram examinations.                  Risk Factors:Hypertension and Diabetes. Pulmonary fibrosis.  Sonographer:     Cristela Blue Referring Phys:  [id number] OTLXBWIO AGBATA Diagnosing Phys: Marcina Millard MD  Sonographer Comments: Suboptimal apical window. IMPRESSIONS  1. Left ventricular ejection fraction, by estimation, is 35 to 40%. The left ventricle has moderately decreased function. The left ventricle has no regional wall motion  abnormalities. Left ventricular diastolic parameters were normal.  2. Right ventricular systolic function is normal. The right ventricular size is normal.  3. The mitral valve is normal in structure. Moderate mitral valve regurgitation. No evidence of mitral stenosis.  4. The aortic valve is normal in structure. Aortic valve regurgitation is not visualized. Mild to moderate aortic valve stenosis.  5. The inferior vena cava is normal in size with greater than 50% respiratory variability, suggesting right atrial pressure of 3 mmHg. FINDINGS  Left Ventricle: Left ventricular ejection fraction, by estimation, is 35 to 40%. The left ventricle has moderately decreased function. The left ventricle has no regional wall motion abnormalities. The left ventricular internal cavity size was normal in size. There is no left ventricular hypertrophy. Left ventricular diastolic parameters were normal. Right Ventricle: The right ventricular size is normal. No increase in right ventricular wall thickness. Right ventricular systolic function is normal. Left Atrium: Left atrial  size was normal in size. Right Atrium: Right atrial size was normal in size. Pericardium: There is no evidence of pericardial effusion. Mitral Valve: The mitral valve is normal in structure. Moderate mitral valve regurgitation. No evidence of mitral valve stenosis. Tricuspid Valve: The tricuspid valve is normal in structure. Tricuspid valve regurgitation is mild . No evidence of tricuspid stenosis. Aortic Valve: The aortic valve is normal in structure. Aortic valve regurgitation is not visualized. Mild to moderate aortic stenosis is present. Aortic valve mean gradient measures 13.0 mmHg. Aortic valve peak gradient measures 22.4 mmHg. Aortic valve area, by VTI measures 0.75 cm. Pulmonic Valve: The pulmonic valve was normal in structure. Pulmonic valve regurgitation is not visualized. No evidence of pulmonic stenosis. Aorta: The aortic root is normal in size and  structure. Venous: The inferior vena cava is normal in size with greater than 50% respiratory variability, suggesting right atrial pressure of 3 mmHg. IAS/Shunts: No atrial level shunt detected by color flow Doppler.  LEFT VENTRICLE PLAX 2D LVIDd:         4.20 cm     Diastology LVIDs:         4.10 cm     LV e' medial:    4.68 cm/s LV PW:         1.00 cm     LV E/e' medial:  20.0 LV IVS:        0.85 cm     LV e' lateral:   13.50 cm/s LVOT diam:     2.00 cm     LV E/e' lateral: 6.9 LV SV:         28 LV SV Index:   15 LVOT Area:     3.14 cm  LV Volumes (MOD) LV vol d, MOD A2C: 64.9 ml LV vol d, MOD A4C: 99.4 ml LV vol s, MOD A2C: 53.3 ml LV vol s, MOD A4C: 63.7 ml LV SV MOD A2C:     11.6 ml LV SV MOD A4C:     99.4 ml LV SV MOD BP:      21.4 ml RIGHT VENTRICLE RV Basal diam:  4.30 cm RV S prime:     8.81 cm/s TAPSE (M-mode): 3.7 cm LEFT ATRIUM             Index        RIGHT ATRIUM           Index LA diam:        3.80 cm 1.98 cm/m   RA Area:     21.30 cm LA Vol (A2C):   55.3 ml 28.75 ml/m  RA Volume:   60.70 ml  31.56 ml/m LA Vol (A4C):   62.3 ml 32.39 ml/m LA Biplane Vol: 59.4 ml 30.88 ml/m  AORTIC VALVE                     PULMONIC VALVE AV Area (Vmax):    0.65 cm      PV Vmax:        0.58 m/s AV Area (Vmean):   0.61 cm      PV Peak grad:   1.3 mmHg AV Area (VTI):     0.75 cm      RVOT Peak grad: 2 mmHg AV Vmax:           236.67 cm/s AV Vmean:          164.000 cm/s AV VTI:  0.379 m AV Peak Grad:      22.4 mmHg AV Mean Grad:      13.0 mmHg LVOT Vmax:         49.00 cm/s LVOT Vmean:        32.100 cm/s LVOT VTI:          0.090 m LVOT/AV VTI ratio: 0.24  AORTA Ao Root diam: 3.10 cm MITRAL VALVE               TRICUSPID VALVE MV Area (PHT): 6.37 cm    TR Peak grad:   20.8 mmHg MV Decel Time: 119 msec    TR Vmax:        228.00 cm/s MV E velocity: 93.40 cm/s MV A velocity: 57.40 cm/s  SHUNTS MV E/A ratio:  1.63        Systemic VTI:  0.09 m                            Systemic Diam: 2.00 cm Marcina Millard MD Electronically signed by Marcina Millard MD Signature Date/Time: 10/06/2021/1:34:57 PM    Final                LOS: 2 days   Lurene Shadow  [facility]   Pager on [email or web address]. If 7PM-7AM, please contact night-coverage at [email or web address]     10/07/2021, 4:52 PM

## 2021-10-07 NOTE — Care Management Important Message (Signed)
Important Message  Patient Details  Name: Lee Johnson MRN: [id number] Date of Birth: 09/24/1941   Medicare Important Message Given:  Other (see comment)  Disposition to discharge home with hospice services.  Medicare IM withheld at this time.   Johnell Comings 10/07/2021, 2:31 PM

## 2021-10-07 NOTE — Progress Notes (Signed)
[facility]) Hospital Liaison Note  Received request from Transitions of Care Manager Meagan [place], [place], for hospice services at home after discharge. Chart and patient information reviewed by [facility] physician. Hospice eligibility confirmed.  Visited patient at bedside and spoke with wife and children to initiate education related to hospice philosophy, services and team approach to care. Patient's family verbalized understanding of information provided. Per discussion, the plan for discharge is either 10.14.22 or 10.15.22 via EMS.  DME needs discussed. Patient has the following equipment in the home: oxygen, w/c, walker, BSC. Patient's family requests the following equipment for delivery: hospital bed and OBT. Address has been verified and is correct in the chart. Caston Coopersmith, spouse is the family contact to arrange time of equipment delivery.   Please send signed and completed DNR home with patient/family. Please provide prescriptions at discharge as needed to ensure ongoing symptom management.   [facility] information and contact numbers given to family. Above information shared with [facility] Manager.   Please do not hesitate to call with any hospice related questions or concerns.   Thank you for the opportunity to participate in this patient's care.   Bobbie "Einar Gip, RN, BSN [facility] Liaison [phone]

## 2021-10-08 MED ORDER — MORPHINE SULFATE 20 MG/5ML PO SOLN: 5.0000 mg | ORAL | 0 refills | Status: AC | PRN

## 2021-10-08 NOTE — TOC Transition Note (Signed)
Transition of Care [facility]) - CM/SW Discharge Note   Patient Details  Name: Helios Kohlmann MRN: [id number] Date of Birth: 1941-06-13  Transition of Care [facility]) CM/SW Contact:  Gildardo Griffes, LCSW Phone Number: 10/08/2021, 9:58 AM   Clinical Narrative:     Patient to discharge home today with [facility] hospice. Per [facility] and family, hospital bed was delivered at 8:30 pm on 10/14, family prepared for patient to be transported home via [facility].   [facility] forms on chart, to be called pending dc orders.   Final next level of care: Home w Hospice Care Barriers to Discharge: No Barriers Identified   Patient Goals and CMS Choice   CMS Medicare.gov Compare Post Acute Care list provided to:: Patient Represenative (must comment) Choice offered to / list presented to : Adult Children  Discharge Placement                Patient to be transferred to facility by: [facility]   Patient and family notified of of transfer: 10/08/21  Discharge Plan and Services                                     Social Determinants of Health (SDOH) Interventions     Readmission Risk Interventions No flowsheet data found.

## 2021-10-08 NOTE — Discharge Summary (Signed)
Physician Discharge Summary  Lee Johnson XLK:[id number] DOB: 04-20-1941 DOA: 10/05/2021  PCP: Dione Housekeeper, MD  Admit date: 10/05/2021 Discharge date: 10/08/2021  Discharge disposition: Home   Recommendations for Outpatient Follow-Up:   Follow-up with hospice team at home   Discharge Diagnosis:   Principal Problem:   NSTEMI (non-ST elevated myocardial infarction) [facility]) Active Problems:   Generalized weakness   Fall   Diabetes mellitus without complication (HCC)   Polymyositis (HCC)   Pulmonary fibrosis (HCC)   Chronic respiratory failure (HCC)   Pulmonary edema   End of life care   Acute systolic CHF (congestive heart failure) (HCC)    Discharge Condition: Stable.  Diet recommendation:  Diet Order             Diet general                     Code Status: DNR     Hospital Course:   Lee Johnson is a 80 y.o. male with medical history significant for moderate aortic stenosis, diabetes mellitus, hypertension, polymyositis, interstitial lung disease, chronic hypoxic respiratory failure on 4 L/min oxygen at home, wheelchair-bound at baseline.  He presented to the hospital because of cough, shortness of breath, generalized weakness and near syncope.  Reportedly, he tried to get out of bed but he was passed out and fell.  He had another near syncopal episode when his wife tried to take him to the bathroom.    He was found to have acute NSTEMI, acute systolic and acute on chronic diastolic CHF and suspected pneumonia.  Sepsis was ruled out.  Peak troponin was 5,699.  He was treated with IV Lasix, IV heparin infusion and empiric IV antibiotics.  He developed acute respiratory distress associated with use of accessory respiratory muscles.  He was briefly placed on BiPAP.  Patient and his family opted for comfort measures only and requested that all medications be discontinued.  Hospice team was consulted and patient was deemed to be appropriate  for comfort measures with hospice at home.   Discharge plan was discussed with the patient, his wife and son at the bedside.  Morphine prescription was sent electronically to [facility] pharmacy in [place] per wife's request.    Discharge Exam:    Vitals:   10/07/21 0930 10/07/21 1600 10/07/21 1939 10/08/21 0746  BP:  111/75 127/71 132/84  Pulse:  100 (!) 114 61  Resp:  16 18 17   Temp:  98.1 F (36.7 C) 99 F (37.2 C) 97.6 F (36.4 C)  TempSrc:  Axillary Oral   SpO2: 96%  95% 100%  Weight:      Height:         GEN: NAD, ill looking SKIN: Warm and dry EYES: No pallor or icterus ENT: MMM CV: RRR, tachycardic PULM: Bibasilar rales.  No wheezing ABD: soft, ND, NT, +BS CNS: AAO x 3, non focal EXT: No edema or tenderness   The results of significant diagnostics from this hospitalization (including imaging, microbiology, ancillary and laboratory) are listed below for reference.     Procedures and Diagnostic Studies:   ECHOCARDIOGRAM COMPLETE  Result Date: 10/06/2021    ECHOCARDIOGRAM REPORT   Patient Name:   Lee Johnson Date of Exam: 10/06/2021 Medical Rec #:  10/08/2021        Height:       70.0 in Accession #:    [id number]       Weight:  165.0 lb Date of Birth:  1941/08/27       BSA:          1.923 m Patient Age:    80 years         BP:           105/71 mmHg Patient Gender: M                HR:           111 bpm. Exam Location:  [facility] Procedure: 2D Echo, Cardiac Doppler and Color Doppler Indications:     NSTEMI I21.4  History:         Patient has no prior history of Echocardiogram examinations.                  Risk Factors:Hypertension and Diabetes. Pulmonary fibrosis.  Sonographer:     Cristela Blue Referring Phys:  [id number] AGBATA Diagnosing Phys: Marcina Millard MD  Sonographer Comments: Suboptimal apical window. IMPRESSIONS  1. Left ventricular ejection fraction, by estimation, is 35 to 40%. The left ventricle has moderately decreased function. The left  ventricle has no regional wall motion abnormalities. Left ventricular diastolic parameters were normal.  2. Right ventricular systolic function is normal. The right ventricular size is normal.  3. The mitral valve is normal in structure. Moderate mitral valve regurgitation. No evidence of mitral stenosis.  4. The aortic valve is normal in structure. Aortic valve regurgitation is not visualized. Mild to moderate aortic valve stenosis.  5. The inferior vena cava is normal in size with greater than 50% respiratory variability, suggesting right atrial pressure of 3 mmHg. FINDINGS  Left Ventricle: Left ventricular ejection fraction, by estimation, is 35 to 40%. The left ventricle has moderately decreased function. The left ventricle has no regional wall motion abnormalities. The left ventricular internal cavity size was normal in size. There is no left ventricular hypertrophy. Left ventricular diastolic parameters were normal. Right Ventricle: The right ventricular size is normal. No increase in right ventricular wall thickness. Right ventricular systolic function is normal. Left Atrium: Left atrial size was normal in size. Right Atrium: Right atrial size was normal in size. Pericardium: There is no evidence of pericardial effusion. Mitral Valve: The mitral valve is normal in structure. Moderate mitral valve regurgitation. No evidence of mitral valve stenosis. Tricuspid Valve: The tricuspid valve is normal in structure. Tricuspid valve regurgitation is mild . No evidence of tricuspid stenosis. Aortic Valve: The aortic valve is normal in structure. Aortic valve regurgitation is not visualized. Mild to moderate aortic stenosis is present. Aortic valve mean gradient measures 13.0 mmHg. Aortic valve peak gradient measures 22.4 mmHg. Aortic valve area, by VTI measures 0.75 cm. Pulmonic Valve: The pulmonic valve was normal in structure. Pulmonic valve regurgitation is not visualized. No evidence of pulmonic stenosis. Aorta:  The aortic root is normal in size and structure. Venous: The inferior vena cava is normal in size with greater than 50% respiratory variability, suggesting right atrial pressure of 3 mmHg. IAS/Shunts: No atrial level shunt detected by color flow Doppler.  LEFT VENTRICLE PLAX 2D LVIDd:         4.20 cm     Diastology LVIDs:         4.10 cm     LV e' medial:    4.68 cm/s LV PW:         1.00 cm     LV E/e' medial:  20.0 LV IVS:  0.85 cm     LV e' lateral:   13.50 cm/s LVOT diam:     2.00 cm     LV E/e' lateral: 6.9 LV SV:         28 LV SV Index:   15 LVOT Area:     3.14 cm  LV Volumes (MOD) LV vol d, MOD A2C: 64.9 ml LV vol d, MOD A4C: 99.4 ml LV vol s, MOD A2C: 53.3 ml LV vol s, MOD A4C: 63.7 ml LV SV MOD A2C:     11.6 ml LV SV MOD A4C:     99.4 ml LV SV MOD BP:      21.4 ml RIGHT VENTRICLE RV Basal diam:  4.30 cm RV S prime:     8.81 cm/s TAPSE (M-mode): 3.7 cm LEFT ATRIUM             Index        RIGHT ATRIUM           Index LA diam:        3.80 cm 1.98 cm/m   RA Area:     21.30 cm LA Vol (A2C):   55.3 ml 28.75 ml/m  RA Volume:   60.70 ml  31.56 ml/m LA Vol (A4C):   62.3 ml 32.39 ml/m LA Biplane Vol: 59.4 ml 30.88 ml/m  AORTIC VALVE                     PULMONIC VALVE AV Area (Vmax):    0.65 cm      PV Vmax:        0.58 m/s AV Area (Vmean):   0.61 cm      PV Peak grad:   1.3 mmHg AV Area (VTI):     0.75 cm      RVOT Peak grad: 2 mmHg AV Vmax:           236.67 cm/s AV Vmean:          164.000 cm/s AV VTI:            0.379 m AV Peak Grad:      22.4 mmHg AV Mean Grad:      13.0 mmHg LVOT Vmax:         49.00 cm/s LVOT Vmean:        32.100 cm/s LVOT VTI:          0.090 m LVOT/AV VTI ratio: 0.24  AORTA Ao Root diam: 3.10 cm MITRAL VALVE               TRICUSPID VALVE MV Area (PHT): 6.37 cm    TR Peak grad:   20.8 mmHg MV Decel Time: 119 msec    TR Vmax:        228.00 cm/s MV E velocity: 93.40 cm/s MV A velocity: 57.40 cm/s  SHUNTS MV E/A ratio:  1.63        Systemic VTI:  0.09 m                             Systemic Diam: 2.00 cm Marcina Millard MD Electronically signed by Marcina Millard MD Signature Date/Time: 10/06/2021/1:34:57 PM    Final      Labs:   Basic Metabolic Panel: Recent Labs  Lab 10/05/21 1323 10/05/21 1938 10/06/21 0445  NA 134*  --  134*  K 4.9  --  4.5  CL 90*  --  95*  CO2 31  --  27  GLUCOSE 204*  --  178*  BUN 33*  --  33*  CREATININE 1.27*  --  1.28*  CALCIUM 10.1  --  8.8*  MG  --  1.9  --    GFR Estimated Creatinine Clearance: 47.5 mL/min (A) (by C-G formula based on SCr of 1.28 mg/dL (H)). Liver Function Tests: Recent Labs  Lab 10/05/21 1945  AST 66*  ALT 61*  ALKPHOS 38  BILITOT 0.4  PROT 6.0*  ALBUMIN 2.7*   No results for input(s): LIPASE, AMYLASE in the last 168 hours. No results for input(s): AMMONIA in the last 168 hours. Coagulation profile Recent Labs  Lab 10/05/21 1554  INR 1.0    CBC: Recent Labs  Lab 10/05/21 1323 10/06/21 0445  WBC 17.7* 17.2*  HGB 9.4* 8.9*  HCT 28.9* 27.6*  MCV 93.2 93.9  PLT 222 199   Cardiac Enzymes: No results for input(s): CKTOTAL, CKMB, CKMBINDEX, TROPONINI in the last 168 hours. BNP: Invalid input(s): POCBNP CBG: Recent Labs  Lab 10/06/21 0719 10/06/21 1135 10/06/21 1614  GLUCAP 153* 191* 267*   D-Dimer No results for input(s): DDIMER in the last 72 hours. Hgb A1c Recent Labs    10/05/21 1721  HGBA1C 6.5*   Lipid Profile No results for input(s): CHOL, HDL, LDLCALC, TRIG, CHOLHDL, LDLDIRECT in the last 72 hours. Thyroid function studies No results for input(s): TSH, T4TOTAL, T3FREE, THYROIDAB in the last 72 hours.  Invalid input(s): FREET3 Anemia work up No results for input(s): VITAMINB12, FOLATE, FERRITIN, TIBC, IRON, RETICCTPCT in the last 72 hours. Microbiology Recent Results (from the past 240 hour(s))  Resp Panel by RT-PCR (Flu A&B, Covid) Nasopharyngeal Swab     Status: None   Collection Time: 10/05/21  3:54 PM   Specimen: Nasopharyngeal Swab;  Nasopharyngeal(NP) swabs in vial transport medium  Result Value Ref Range Status   SARS Coronavirus 2 by RT PCR NEGATIVE NEGATIVE Final    Comment: (NOTE) SARS-CoV-2 target nucleic acids are NOT DETECTED.  The SARS-CoV-2 RNA is generally detectable in upper respiratory specimens during the acute phase of infection. The lowest concentration of SARS-CoV-2 viral copies this assay can detect is 138 copies/mL. A negative result does not preclude SARS-Cov-2 infection and should not be used as the sole basis for treatment or other patient management decisions. A negative result may occur with  improper specimen collection/handling, submission of specimen other than nasopharyngeal swab, presence of viral mutation(s) within the areas targeted by this assay, and inadequate number of viral copies(<138 copies/mL). A negative result must be combined with clinical observations, patient history, and epidemiological information. The expected result is Negative.  Fact Sheet for Patients:  BloggerCourse.com  Fact Sheet for Healthcare Providers:  SeriousBroker.it  This test is no t yet approved or cleared by the [place] FDA and  has been authorized for detection and/or diagnosis of SARS-CoV-2 by FDA under an Emergency Use Authorization (EUA). This EUA will remain  in effect (meaning this test can be used) for the duration of the COVID-19 declaration under Section 564(b)(1) of the Act, 21 U.S.C.section 360bbb-3(b)(1), unless the authorization is terminated  or revoked sooner.       Influenza A by PCR NEGATIVE NEGATIVE Final   Influenza B by PCR NEGATIVE NEGATIVE Final    Comment: (NOTE) The Xpert Xpress SARS-CoV-2/FLU/RSV plus assay is intended as an aid in the diagnosis of influenza from Nasopharyngeal swab specimens and should not be used as a sole basis for  treatment. Nasal washings and aspirates are unacceptable for Xpert Xpress  SARS-CoV-2/FLU/RSV testing.  Fact Sheet for Patients: BloggerCourse.com  Fact Sheet for Healthcare Providers: SeriousBroker.it  This test is not yet approved or cleared by the [place] FDA and has been authorized for detection and/or diagnosis of SARS-CoV-2 by FDA under an Emergency Use Authorization (EUA). This EUA will remain in effect (meaning this test can be used) for the duration of the COVID-19 declaration under Section 564(b)(1) of the Act, 21 U.S.C. section 360bbb-3(b)(1), unless the authorization is terminated or revoked.  Performed at [facility], [address] [place], [place] [address]   Blood culture (routine x 2)     Status: None (Preliminary result)   Collection Time: 10/05/21  5:28 PM   Specimen: BLOOD  Result Value Ref Range Status   Specimen Description BLOOD BLOOD RIGHT HAND  Final   Special Requests   Final    BOTTLES DRAWN AEROBIC AND ANAEROBIC Blood Culture results may not be optimal due to an inadequate volume of blood received in culture bottles   Culture   Final    NO GROWTH 3 DAYS Performed at [facility], [address], [place], [place] [address]    Report Status PENDING  Incomplete  Culture, blood (Routine X 2) w Reflex to ID Panel     Status: None (Preliminary result)   Collection Time: 10/06/21  7:07 AM   Specimen: BLOOD RIGHT HAND  Result Value Ref Range Status   Specimen Description BLOOD RIGHT HAND  Final   Special Requests   Final    BOTTLES DRAWN AEROBIC AND ANAEROBIC Blood Culture adequate volume   Culture   Final    NO GROWTH 2 DAYS Performed at [facility], [address] [place], [place] [address]    Report Status PENDING  Incomplete     Discharge Instructions:   Discharge Instructions     Diet general   Complete by: As directed    Discharge instructions   Complete by: As directed    Follow up with hospice team within 24 hours of  discharge      Allergies as of 10/08/2021       Reactions   Atorvastatin    Other reaction(s): Muscle Pain Other reaction(s): Muscle Pain Other reaction(s): Muscle Pain Other reaction(s): Muscle Pain   Statins Other (See Comments)   Polymyositis. 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     Montelukast    Other reaction(s): Other (See Comments)   Montelukast Sodium    Other reaction(s): Unknown   Other Diarrhea   Sulfa Antibiotics Diarrhea   Sulfamethoxazole-trimethoprim Diarrhea        Medication List     STOP taking these medications    alendronate 70 MG tablet Commonly known as: FOSAMAX   aspirin EC 81 MG tablet   Calcium Carbonate-Vitamin D 600-400 MG-UNIT tablet   Cholecalciferol 25 MCG (1000 UT) tablet   enalapril 5 MG tablet Commonly known as: VASOTEC   esomeprazole 20 MG capsule Commonly known as: NEXIUM   Fish Oil 1000 MG Caps   Flaxseed Oil 1000 MG Caps   glipiZIDE 10 MG 24 hr tablet Commonly known as: GLUCOTROL XL   magnesium oxide 400 MG tablet Commonly known as: MAG-OX   mirabegron ER 50 MG Tb24 tablet Commonly known as: MYRBETRIQ   predniSONE 5 MG tablet Commonly known as: DELTASONE   tamsulosin 0.4 MG Caps capsule Commonly known as: FLOMAX       TAKE  these medications    morphine 20 MG/5ML solution Take 1.3 mLs (5.2 mg total) by mouth every 3 (three) hours as needed for pain.           If you experience worsening of your admission symptoms, develop shortness of breath, life threatening emergency, suicidal or homicidal thoughts you must seek medical attention immediately by calling 911 or calling your MD immediately  if symptoms less severe.   You must read complete instructions/literature along with all the possible adverse reactions/side effects for all the medicines you take and that have been prescribed to you. Take any new medicines after you have completely understood and accept all the possible adverse reactions/side effects.     Please note   You were cared for by a hospitalist during your hospital stay. If you have any questions about your discharge medications or the care you received while you were in the hospital after you are discharged, you can call the unit and asked to speak with the hospitalist on call if the hospitalist that took care of you is not available. Once you are discharged, your primary care physician will handle any further medical issues. Please note that NO REFILLS for any discharge medications will be authorized once you are discharged, as it is imperative that you return to your primary care physician (or establish a relationship with a primary care physician if you do not have one) for your aftercare needs so that they can reassess your need for medications and monitor your lab values.       Time coordinating discharge: 31 minutes  Signed:  Cristine Daw  [facility] 10/08/2021, 10:01 AM   Pager on [email or web address]. If 7PM-7AM, please contact night-coverage at [email or web address]

## 2021-10-08 NOTE — Plan of Care (Signed)

## 2021-10-08 NOTE — Progress Notes (Signed)
Pt discharged home with Hospice via EMS. All belongings sent with family. [place], [facility] hospice liaison called and notified of patient's ETA to home. She stated she is sending a nurse to the home around 5:30pm tonight. Morphine sent to [facility] pharmacy. Patient and family aware of above.

## 2021-10-10 LAB — CULTURE, BLOOD (ROUTINE X 2): Culture: NO GROWTH

## 2021-10-11 LAB — CULTURE, BLOOD (ROUTINE X 2)
Culture: NO GROWTH
Special Requests: ADEQUATE

## 2021-10-12 ENCOUNTER — Ambulatory Visit: Payer: Medicare Other | Admitting: Urology

## 2021-10-25 DEATH — deceased

## 2022-07-01 IMAGING — CT CT HEAD W/O CM
3 series · 16 of 47 positions shown, 19 images · non-contrast
Comparison: March 15, 2021.

CLINICAL DATA: Altered mental status.

EXAM:
CT HEAD WITHOUT CONTRAST
TECHNIQUE: Contiguous axial images were obtained from the base of the skull
through the vertex without intravenous contrast.

[Series 2: head wo · axial · 0.39mm/px · z∈[+466,+591]mm · 10 of 30 slices shown, 13 images]
[im 3/30  brain]
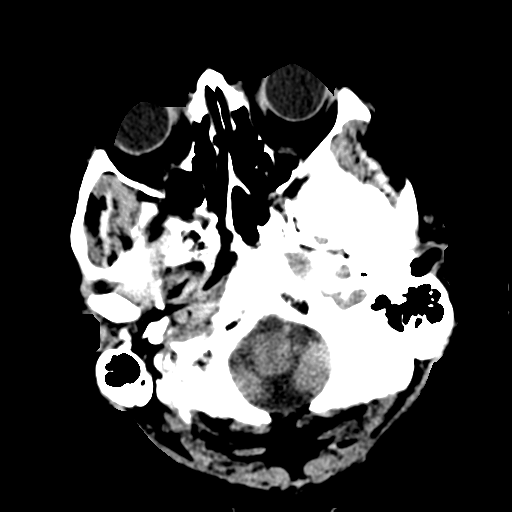
[im 3/30  bone]
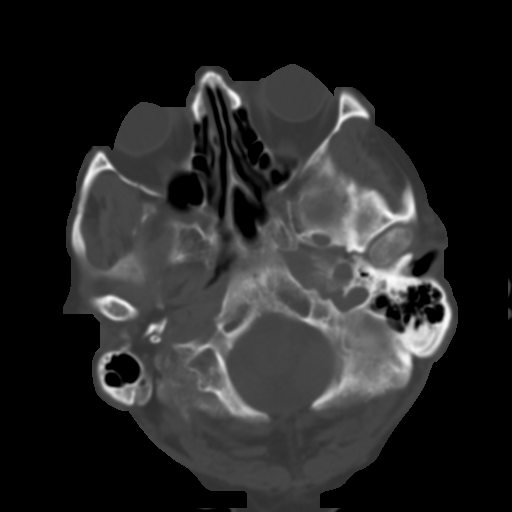
[im 6/30  brain]
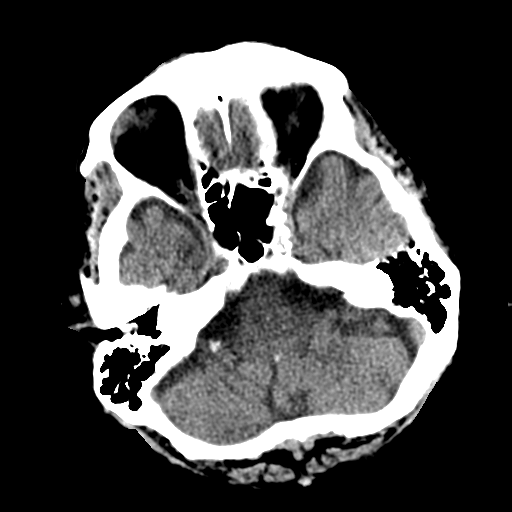
[im 9/30  brain]
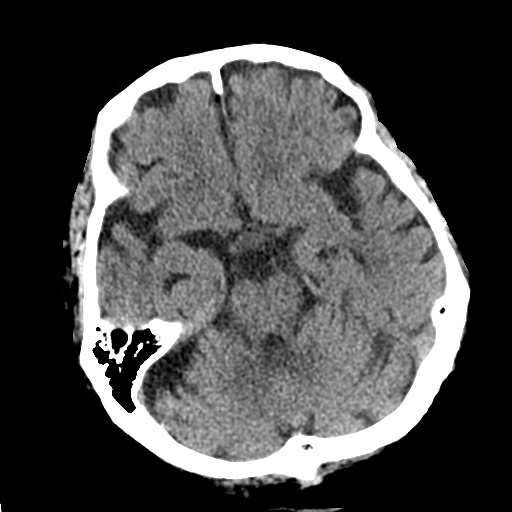
[im 11/30  brain]
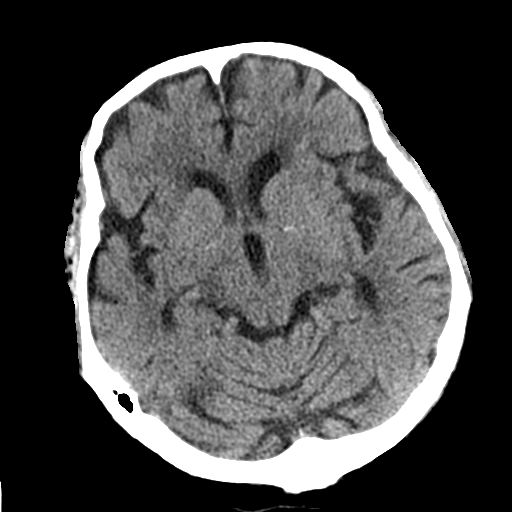
[im 14/30  brain]
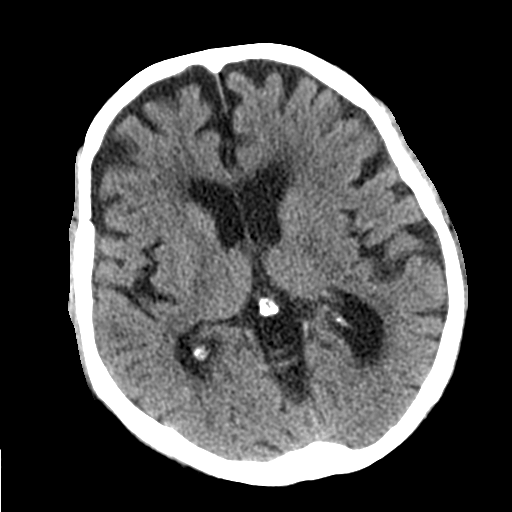
[im 14/30  bone]
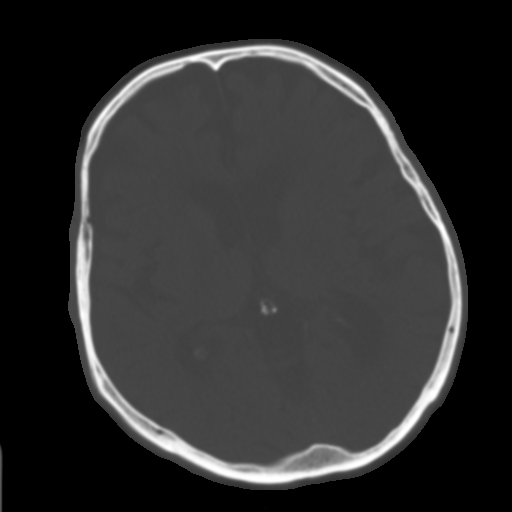
[im 17/30  brain]
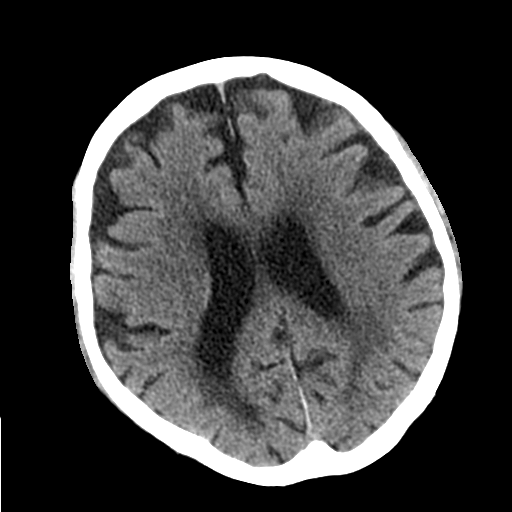
[im 20/30  brain]
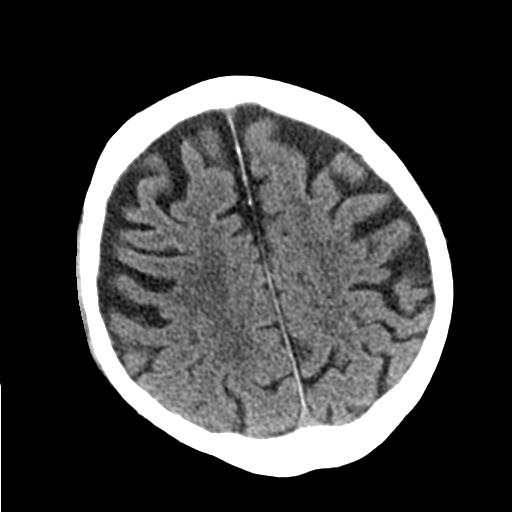
[im 23/30  brain]
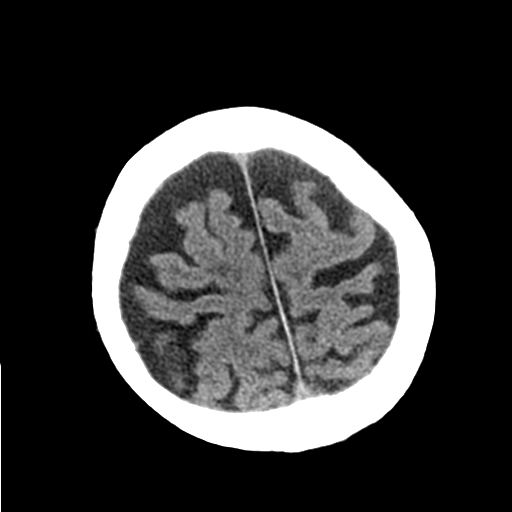
[im 25/30  brain]
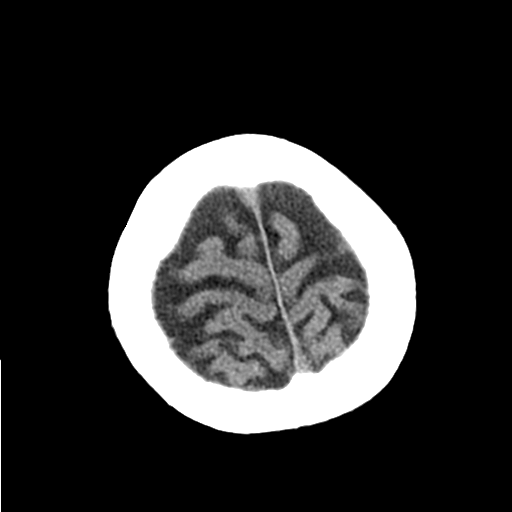
[im 25/30  bone]
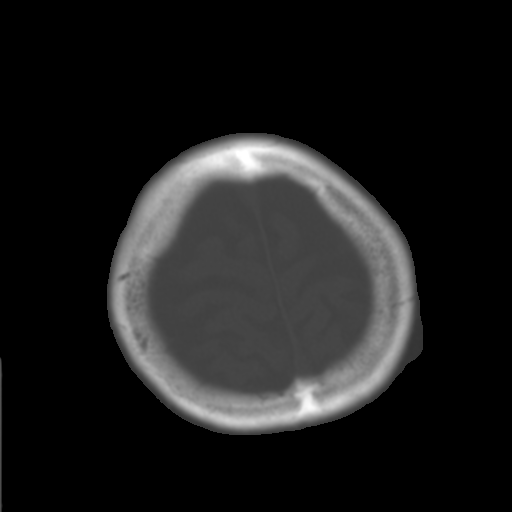
[im 28/30  brain]
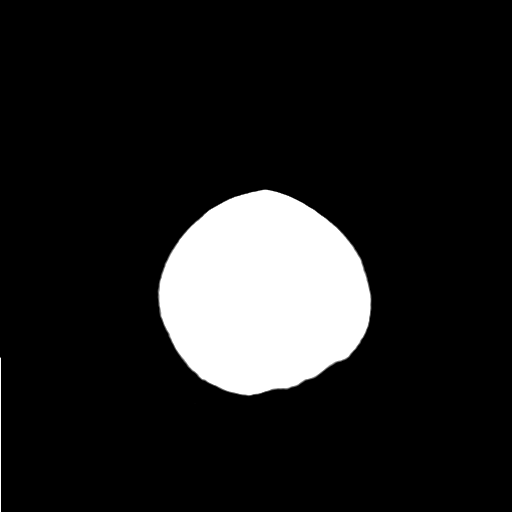

[Series 4: coronal soft tissue · coronal · 0.32mm/px · 3 of 64 slices shown]
[im 22/64  brain]
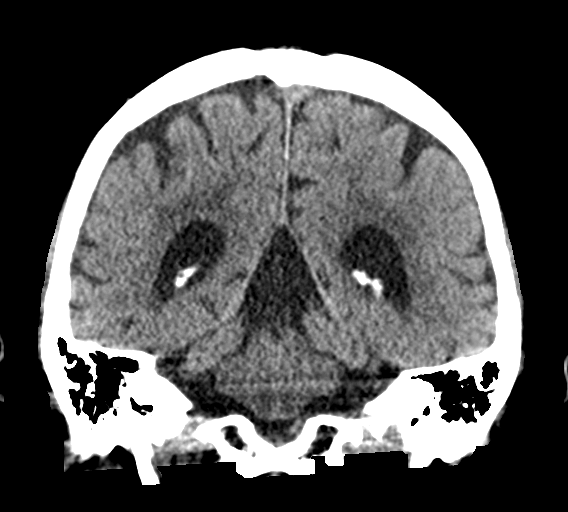
[im 29/64  brain]
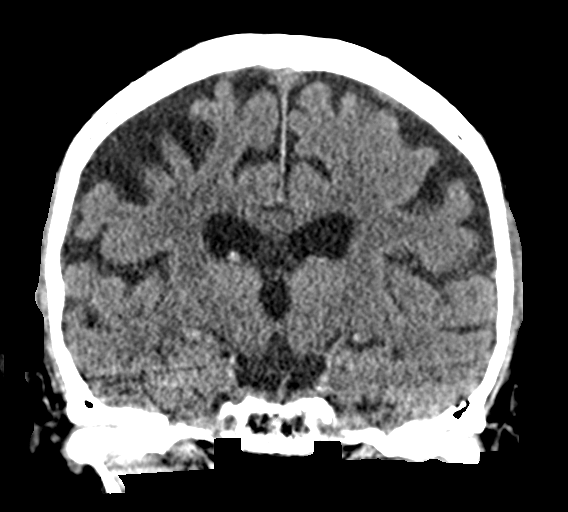
[im 36/64  brain]
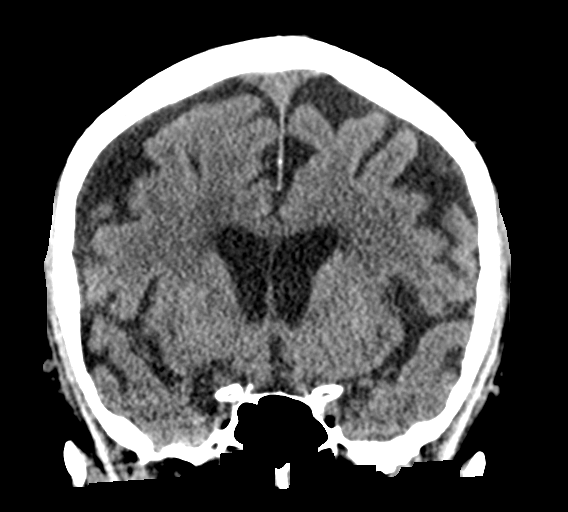

[Series 5: sagittal soft tissue · sagittal · 0.32mm/px · 3 of 61 slices shown]
[im 21/61  brain]
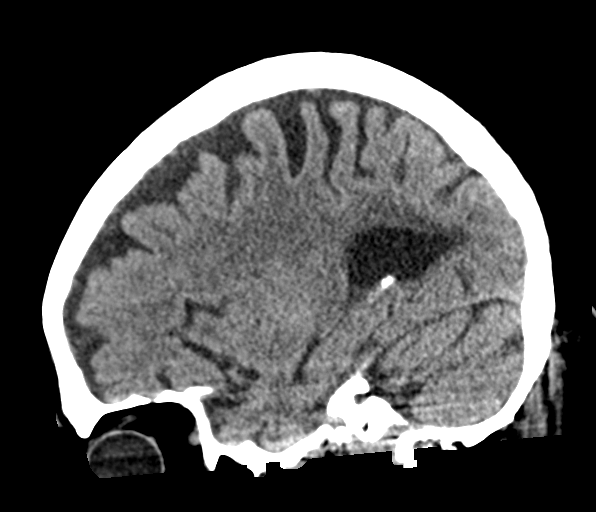
[im 31/61  brain]
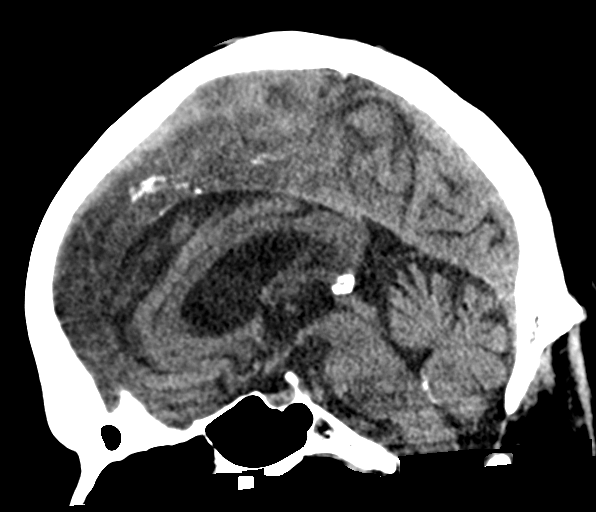
[im 41/61  brain]
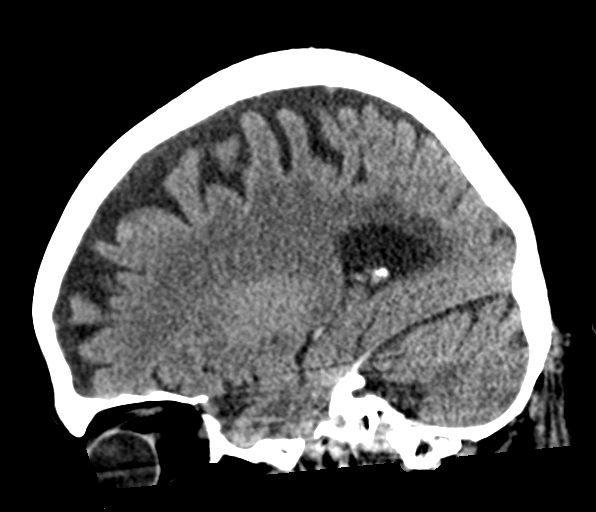

[16 of 47 positions shown; findings below may reference images not displayed]

FINDINGS: Brain: Mild chronic ischemic white matter disease is noted. No mass
effect or midline shift is noted. Ventricular size is within normal
limits. There is no evidence of mass lesion, hemorrhage or acute
infarction.

Vascular: No hyperdense vessel or unexpected calcification.

Skull: Normal. Negative for fracture or focal lesion.

Sinuses/Orbits: No acute finding.

Other: None.
IMPRESSION: No acute intracranial abnormality seen.
